# Patient Record
Sex: Male | Born: 1950 | Race: Black or African American | Hispanic: No | Marital: Single | Smoking: Former smoker
Health system: Southern US, Community
[De-identification: ages and names within clinical notes are randomized; demographics above are authoritative.]

## PROBLEM LIST (undated history)

## (undated) DIAGNOSIS — E119 Type 2 diabetes mellitus without complications: Secondary | ICD-10-CM

## (undated) DIAGNOSIS — G4730 Sleep apnea, unspecified: Secondary | ICD-10-CM

## (undated) DIAGNOSIS — E7800 Pure hypercholesterolemia, unspecified: Secondary | ICD-10-CM

## (undated) HISTORY — PX: NO PAST SURGERIES: SHX2092

## (undated) HISTORY — DX: Sleep apnea, unspecified: G47.30

---

## 1998-02-02 ENCOUNTER — Emergency Department (HOSPITAL_COMMUNITY): Admission: EM | Admit: 1998-02-02 | Discharge: 1998-02-02 | Payer: Self-pay | Admitting: Emergency Medicine

## 1998-08-28 ENCOUNTER — Emergency Department (HOSPITAL_COMMUNITY): Admission: EM | Admit: 1998-08-28 | Discharge: 1998-08-28 | Payer: Self-pay | Admitting: Emergency Medicine

## 1998-08-28 ENCOUNTER — Encounter: Payer: Self-pay | Admitting: Emergency Medicine

## 1999-01-09 ENCOUNTER — Emergency Department (HOSPITAL_COMMUNITY): Admission: EM | Admit: 1999-01-09 | Discharge: 1999-01-09 | Payer: Self-pay | Admitting: Endocrinology

## 1999-01-09 ENCOUNTER — Encounter: Payer: Self-pay | Admitting: Endocrinology

## 1999-03-12 ENCOUNTER — Emergency Department (HOSPITAL_COMMUNITY): Admission: EM | Admit: 1999-03-12 | Discharge: 1999-03-12 | Payer: Self-pay | Admitting: *Deleted

## 1999-08-06 ENCOUNTER — Emergency Department (HOSPITAL_COMMUNITY): Admission: EM | Admit: 1999-08-06 | Discharge: 1999-08-06 | Payer: Self-pay | Admitting: *Deleted

## 1999-11-09 ENCOUNTER — Emergency Department (HOSPITAL_COMMUNITY): Admission: EM | Admit: 1999-11-09 | Discharge: 1999-11-09 | Payer: Self-pay | Admitting: *Deleted

## 2000-08-12 ENCOUNTER — Emergency Department (HOSPITAL_COMMUNITY): Admission: EM | Admit: 2000-08-12 | Discharge: 2000-08-12 | Payer: Self-pay | Admitting: Emergency Medicine

## 2000-08-30 ENCOUNTER — Emergency Department (HOSPITAL_COMMUNITY): Admission: EM | Admit: 2000-08-30 | Discharge: 2000-08-30 | Payer: Self-pay | Admitting: *Deleted

## 2000-09-01 ENCOUNTER — Emergency Department (HOSPITAL_COMMUNITY): Admission: EM | Admit: 2000-09-01 | Discharge: 2000-09-01 | Payer: Self-pay | Admitting: Emergency Medicine

## 2001-04-07 ENCOUNTER — Emergency Department (HOSPITAL_COMMUNITY): Admission: EM | Admit: 2001-04-07 | Discharge: 2001-04-07 | Payer: Self-pay | Admitting: Emergency Medicine

## 2003-01-30 ENCOUNTER — Emergency Department (HOSPITAL_COMMUNITY): Admission: EM | Admit: 2003-01-30 | Discharge: 2003-01-30 | Payer: Self-pay | Admitting: Emergency Medicine

## 2003-01-30 ENCOUNTER — Encounter: Payer: Self-pay | Admitting: Emergency Medicine

## 2003-07-13 DIAGNOSIS — Z860100 Personal history of colon polyps, unspecified: Secondary | ICD-10-CM | POA: Insufficient documentation

## 2003-10-16 ENCOUNTER — Emergency Department (HOSPITAL_COMMUNITY): Admission: EM | Admit: 2003-10-16 | Discharge: 2003-10-16 | Payer: Self-pay | Admitting: Emergency Medicine

## 2004-03-31 ENCOUNTER — Emergency Department (HOSPITAL_COMMUNITY): Admission: EM | Admit: 2004-03-31 | Discharge: 2004-03-31 | Payer: Self-pay | Admitting: Emergency Medicine

## 2004-08-18 ENCOUNTER — Emergency Department (HOSPITAL_COMMUNITY): Admission: EM | Admit: 2004-08-18 | Discharge: 2004-08-18 | Payer: Self-pay | Admitting: Emergency Medicine

## 2004-08-25 ENCOUNTER — Ambulatory Visit (HOSPITAL_COMMUNITY): Admission: RE | Admit: 2004-08-25 | Discharge: 2004-08-25 | Payer: Self-pay | Admitting: Emergency Medicine

## 2005-05-13 ENCOUNTER — Emergency Department (HOSPITAL_COMMUNITY): Admission: EM | Admit: 2005-05-13 | Discharge: 2005-05-13 | Payer: Self-pay | Admitting: Emergency Medicine

## 2005-11-03 ENCOUNTER — Emergency Department (HOSPITAL_COMMUNITY): Admission: EM | Admit: 2005-11-03 | Discharge: 2005-11-03 | Payer: Self-pay | Admitting: Emergency Medicine

## 2005-12-13 ENCOUNTER — Emergency Department (HOSPITAL_COMMUNITY): Admission: EM | Admit: 2005-12-13 | Discharge: 2005-12-13 | Payer: Self-pay | Admitting: Emergency Medicine

## 2006-01-04 ENCOUNTER — Emergency Department (HOSPITAL_COMMUNITY): Admission: EM | Admit: 2006-01-04 | Discharge: 2006-01-04 | Payer: Self-pay | Admitting: Emergency Medicine

## 2006-06-21 ENCOUNTER — Emergency Department (HOSPITAL_COMMUNITY): Admission: EM | Admit: 2006-06-21 | Discharge: 2006-06-21 | Payer: Self-pay | Admitting: Emergency Medicine

## 2007-05-07 ENCOUNTER — Emergency Department (HOSPITAL_COMMUNITY): Admission: EM | Admit: 2007-05-07 | Discharge: 2007-05-07 | Payer: Self-pay | Admitting: Emergency Medicine

## 2007-08-21 ENCOUNTER — Emergency Department (HOSPITAL_COMMUNITY): Admission: EM | Admit: 2007-08-21 | Discharge: 2007-08-21 | Payer: Self-pay | Admitting: Emergency Medicine

## 2008-08-24 ENCOUNTER — Emergency Department (HOSPITAL_COMMUNITY): Admission: EM | Admit: 2008-08-24 | Discharge: 2008-08-24 | Payer: Self-pay | Admitting: Emergency Medicine

## 2008-10-22 ENCOUNTER — Emergency Department (HOSPITAL_COMMUNITY): Admission: EM | Admit: 2008-10-22 | Discharge: 2008-10-22 | Payer: Self-pay | Admitting: Emergency Medicine

## 2009-02-04 ENCOUNTER — Inpatient Hospital Stay (HOSPITAL_COMMUNITY): Admission: EM | Admit: 2009-02-04 | Discharge: 2009-02-06 | Payer: Self-pay | Admitting: Emergency Medicine

## 2009-09-25 ENCOUNTER — Emergency Department (HOSPITAL_COMMUNITY): Admission: EM | Admit: 2009-09-25 | Discharge: 2009-09-25 | Payer: Self-pay | Admitting: Emergency Medicine

## 2009-10-27 ENCOUNTER — Emergency Department (HOSPITAL_COMMUNITY): Admission: EM | Admit: 2009-10-27 | Discharge: 2009-10-27 | Payer: Self-pay | Admitting: Emergency Medicine

## 2010-09-29 LAB — COMPREHENSIVE METABOLIC PANEL
AST: 23 U/L (ref 0–37)
Albumin: 3.7 g/dL (ref 3.5–5.2)
BUN: 16 mg/dL (ref 6–23)
Calcium: 8.4 mg/dL (ref 8.4–10.5)
GFR calc Af Amer: >60 mL/min (ref >60)
Potassium: 4 mEq/L (ref 3.5–5.1)
Sodium: 138 mEq/L (ref 135–145)
Total Bilirubin: 0.6 mg/dL (ref 0.3–1.2)

## 2010-09-29 LAB — GLUCOSE, CAPILLARY: Glucose-Capillary: 108 mg/dL — ABNORMAL HIGH (ref 70–99)

## 2010-09-29 LAB — POCT I-STAT, CHEM 8
BUN: 17 mg/dL (ref 6–23)
Chloride: 107 mEq/L (ref 96–112)
Creatinine, Ser: 0.8 mg/dL (ref 0.4–1.5)
Glucose, Bld: 103 mg/dL — ABNORMAL HIGH (ref 70–99)
Hemoglobin: 15.6 g/dL (ref 13.0–17.0)
Potassium: 4.1 mEq/L (ref 3.5–5.1)

## 2010-09-29 LAB — CBC
Hemoglobin: 13.9 g/dL (ref 13.0–17.0)
MCHC: 32.4 g/dL (ref 30.0–36.0)
MCV: 81.1 fL (ref 78.0–100.0)
Platelets: 217 10*3/uL (ref 150–400)
RDW: 17.1 % — ABNORMAL HIGH (ref 11.5–15.5)
WBC: 5.7 10*3/uL (ref 4.0–10.5)

## 2010-09-29 LAB — CK TOTAL AND CKMB (NOT AT ARMC)
Relative Index: INVALID (ref 0.0–2.5)
Total CK: 86 U/L (ref 7–232)

## 2010-10-05 LAB — BASIC METABOLIC PANEL
BUN: 12 mg/dL (ref 6–23)
CO2: 28 mEq/L (ref 19–32)
Chloride: 104 mEq/L (ref 96–112)
Creatinine, Ser: 0.83 mg/dL (ref 0.4–1.5)
Glucose, Bld: 107 mg/dL — ABNORMAL HIGH (ref 70–99)
Sodium: 138 mEq/L (ref 135–145)

## 2010-10-05 LAB — CBC
HCT: 43.7 % (ref 39.0–52.0)
MCHC: 32.2 g/dL (ref 30.0–36.0)
Platelets: 238 10*3/uL (ref 150–400)
RBC: 5.46 MIL/uL (ref 4.22–5.81)
RDW: 18 % — ABNORMAL HIGH (ref 11.5–15.5)
WBC: 5.9 10*3/uL (ref 4.0–10.5)

## 2010-10-18 LAB — URINALYSIS, ROUTINE W REFLEX MICROSCOPIC: Hgb urine dipstick: NEGATIVE

## 2010-10-18 LAB — COMPREHENSIVE METABOLIC PANEL
AST: 23 U/L (ref 0–37)
Alkaline Phosphatase: 66 U/L (ref 39–117)
Calcium: 9.4 mg/dL (ref 8.4–10.5)
Chloride: 101 mEq/L (ref 96–112)
Creatinine, Ser: 1.02 mg/dL (ref 0.4–1.5)
GFR calc Af Amer: >60 mL/min (ref >60)
Total Bilirubin: 0.8 mg/dL (ref 0.3–1.2)
Total Protein: 7 g/dL (ref 6.0–8.3)

## 2010-10-18 LAB — GLUCOSE, CAPILLARY
Glucose-Capillary: 103 mg/dL — ABNORMAL HIGH (ref 70–99)
Glucose-Capillary: 153 mg/dL — ABNORMAL HIGH (ref 70–99)
Glucose-Capillary: 169 mg/dL — ABNORMAL HIGH (ref 70–99)
Glucose-Capillary: 203 mg/dL — ABNORMAL HIGH (ref 70–99)
Glucose-Capillary: 225 mg/dL — ABNORMAL HIGH (ref 70–99)
Glucose-Capillary: 280 mg/dL — ABNORMAL HIGH (ref 70–99)
Glucose-Capillary: 297 mg/dL — ABNORMAL HIGH (ref 70–99)
Glucose-Capillary: 344 mg/dL — ABNORMAL HIGH (ref 70–99)
Glucose-Capillary: 355 mg/dL — ABNORMAL HIGH (ref 70–99)
Glucose-Capillary: 446 mg/dL — ABNORMAL HIGH (ref 70–99)

## 2010-10-18 LAB — HEMOGLOBIN A1C: Mean Plasma Glucose: 272 mg/dL

## 2010-10-18 LAB — LIPID PANEL
Cholesterol: 147 mg/dL (ref 0–200)
LDL Cholesterol: 94 mg/dL (ref 0–99)
Triglycerides: 145 mg/dL (ref <150)
VLDL: 29 mg/dL (ref 0–40)

## 2010-10-18 LAB — BLOOD GAS, VENOUS
Acid-Base Excess: 0.1 mmol/L (ref 0.0–2.0)
Bicarbonate: 25.4 mEq/L — ABNORMAL HIGH (ref 20.0–24.0)
Patient temperature: 98.6
TCO2: 22.5 mmol/L (ref 0–100)
pH, Ven: 7.361 — ABNORMAL HIGH (ref 7.250–7.300)
pO2, Ven: 47 mmHg — ABNORMAL HIGH (ref 30.0–45.0)

## 2010-10-18 LAB — MICROALBUMIN, URINE: Microalb, Ur: 1.73 mg/dL (ref 0.00–1.89)

## 2010-10-18 LAB — URINE MICROSCOPIC-ADD ON

## 2010-10-21 LAB — CBC
HCT: 44.9 % (ref 39.0–52.0)
Hemoglobin: 14.9 g/dL (ref 13.0–17.0)
MCHC: 33.3 g/dL (ref 30.0–36.0)
MCV: 79.8 fL (ref 78.0–100.0)
Platelets: 241 10*3/uL (ref 150–400)
RDW: 16.1 % — ABNORMAL HIGH (ref 11.5–15.5)

## 2010-10-21 LAB — BASIC METABOLIC PANEL
BUN: 12 mg/dL (ref 6–23)
CO2: 26 mEq/L (ref 19–32)
Chloride: 109 mEq/L (ref 96–112)
GFR calc non Af Amer: >60 mL/min (ref >60)
Glucose, Bld: 152 mg/dL — ABNORMAL HIGH (ref 70–99)
Potassium: 4.4 mEq/L (ref 3.5–5.1)
Sodium: 138 mEq/L (ref 135–145)

## 2010-10-21 LAB — TROPONIN I: Troponin I: 0.02 ng/mL (ref 0.00–0.06)

## 2010-10-21 LAB — DIFFERENTIAL
Basophils Absolute: 0.1 10*3/uL (ref 0.0–0.1)
Eosinophils Absolute: 0.5 10*3/uL (ref 0.0–0.7)
Eosinophils Relative: 9 % — ABNORMAL HIGH (ref 0–5)
Lymphocytes Relative: 26 % (ref 12–46)
Monocytes Absolute: 0.4 10*3/uL (ref 0.1–1.0)

## 2010-10-27 LAB — POCT CARDIAC MARKERS
CKMB, poc: 1.1 ng/mL (ref 1.0–8.0)
Troponin i, poc: <0.05 ng/mL (ref 0.00–0.09)

## 2010-11-24 NOTE — Discharge Summary (Signed)
Caleb [name], Caleb [name]           ACCOUNT NO.:  [id number]   MEDICAL RECORD NO.:  [id number]          PATIENT TYPE:  INP   LOCATION:  1509                         FACILITY:  [facility]   PHYSICIAN:  Corinna L. Lendell Caprice, MDDATE OF BIRTH:  16-Nov-1950   DATE OF ADMISSION:  02/04/2009  DATE OF DISCHARGE:  02/06/2009                               DISCHARGE SUMMARY   DISCHARGE DIAGNOSES:  1. New onset type 2 diabetes.  2. Hyperlipidemia.   DISCHARGE MEDICATIONS:  1. Metformin 500 mg p.o. b.i.d., increase as tolerated to 1000 mg p.o.      b.i.d.  2. Amaryl 2 mg daily.   CONDITION:  Stable.   ACTIVITY:  He may return to work in a week.   FOLLOW UP:  Follow up with Dr. Clovis Riley in 7-10 days with blood glucose  checks.  He is also being set up for outpatient diabetic education.   CONSULTATIONS:  None.   PROCEDURE:  None.   DIET:  Should be diabetic, low cholesterol.   LABORATORY DATA:  ABG on room air on admission showed a pH of 7.36, pCO2  was 46.  It was a venous draw.  Complete metabolic panel significant for  a glucose of 422, otherwise unremarkable.  Hemoglobin A1c was 11.7, LDL  was 94, HDL 24.  Urinalysis showed greater than 1000 glucose, 15  ketones, negative nitrite, negative leukocyte esterase.   DIAGNOSTICS:  1. EKG showed normal sinus rhythm with early repolarization.  2. CT brain was negative.   HISTORY/HOSPITAL COURSE:  [name] is a pleasant 60 year old black  male who presented to the emergency room with blurry vision.  He had  been told after having had a fasting blood glucose of 154 several months  ago, to increase his activity level and maintain a modified carbohydrate  diet.  He also had polyuria and polydipsia.  He had normal vital signs.  He was in no acute distress, but was noted to have an elevated blood  glucose.  He was admitted, given IV fluids, insulin and diabetic  teaching.  He was transitioned over to oral medications and this will  need to be  adjusted by his primary care physician.  Currently his  polyuria, polydipsia,  blurry vision have all resolved.  His blood sugars are ranging about 100-  200.  He has received diabetic teaching.  We have continued his statin.   Total time on the day of discharge is 40 minutes.      Corinna L. Lendell Caprice, MD  Electronically Signed     CLS/MEDQ  D:  02/06/2009  T:  02/06/2009  Job:  [id number]   cc:   L. Lupe Carney, M.D.  Fax: [phone]

## 2010-11-24 NOTE — H&P (Signed)
[name], [name]           ACCOUNT NO.:  [id number]   MEDICAL RECORD NO.:  [id number]          PATIENT TYPE:  INP   LOCATION:  0105                         FACILITY:  [facility]   PHYSICIAN:  Kela Millin, M.D.DATE OF BIRTH:  03/30/1951   DATE OF ADMISSION:  02/04/2009  DATE OF DISCHARGE:                              HISTORY & PHYSICAL   CHIEF COMPLAINT:  Blurred vision.   HISTORY OF PRESENT ILLNESS:  Caleb [name] is a 60 year old African  American male admitted today with hyperglycemia.  He notes a several  week history of blurred vision which became worse last several days.  Office notes indicate that he had mild hyperglycemia in June 2010 with a  glucose of 154.  He was counseled on diet and exercise with plans for  follow-up in the office.  He also notes associated polyuria and  polydipsia, but initially denies family history of diabetes type 2.  Upon further questioning, he thinks his sister may be diabetic.  We are  asked to admit the patient for glycemic control and diabetes teaching   ALLERGIES:  Simvastatin causes fatigue and ache.   PAST MEDICAL HISTORY.:  1. Hypercholesterolemia.  2. Sacral ileitis  3. Mild obesity.   PAST SURGICAL HISTORY:  None.   SOCIAL HISTORY:  No tobacco.  No alcohol.  No drugs.  He is single and  works as a Arboriculturist.   FAMILY HISTORY:  Mother had cancer, dad had cancer.  Brother had colon  cancer.   REVIEW OF SYSTEMS:  GENERAL:  No fever. No chills.  ENT:  No sore  throat.  No nasal congestion.  RESPIRATORY:  No cough.  No shortness of  breath.  NEURO:  No headache, no dizziness, positive visual changes  (blurring).  Negative numbness or tingling.  GI:  No nausea, no  indigestion.  Denies constipation or diarrhea.  GU:  Does note polyuria.  Musculoskeletal:  Denies joint pain or muscle weakness.  PSYCHIATRIC:  Denies Anxiety or depression.  SKIN:  No rashes.  No lesions.  HEMATOLOGY:  No bleeding.  No bruising.   LABORATORY/RADIOLOGY DATA:  Venous ABG, pH 7.36, pCO2 46, pO2 47, sodium  134, potassium 4.4, chloride 101, bicarb 25, BUN 19, creatinine 1.02,  anion gap 8.  Urinalysis positive glucose, negative nitrate, negative  leukocyte.   PHYSICAL EXAMINATION:  VITAL SIGNS:  BP 123/66, heart rate 76,  respirations 18, temperature 97.6, O2 sat 96% on room air.  GENERAL:  Awake, alert African American male in no acute distress.  NECK:  Supple.  No masses.  CARDIOVASCULAR:.  S1, S2 regular rate and rhythm.  RESPIRATORY:  Breath sounds and auscultation bilaterally without  wheezes, rales, rhonchi.  No increased work of breathing.  ABDOMEN:  Soft, nontender, nondistended.  Positive bowel sounds.  PSYCHIATRIC:  AO x3, pleasant.  NEUROLOGICAL:  Moving all extremities.  Speech is clear.   ASSESSMENT/PLAN:  1. Blurred vision secondary to hyperglycemia in setting of new onset      diabetes type 2.  We will check hemoglobin A1c.  We will also check      a C-peptide and TSH.  Plan to add Lantus insulin and sliding scale      coverage.  We will request nutrition consult as well as diabetes      teaching and insulin teaching.  Plan to also check urine      microalbumin.  2. Hypercholesterolemia.  Continue statin.  Check fasting lipid      profile.  3. Obesity.  The patient was counseled weight loss by an exercise, and      we will request nutrition evaluation.      Sandford Craze, NP      Kela Millin, M.D.  Electronically Signed    MO/MEDQ  D:  02/04/2009  T:  02/04/2009  Job:  [id number]   cc:   L. Lupe Carney, M.D.  Fax: [phone]

## 2011-02-09 ENCOUNTER — Emergency Department (HOSPITAL_COMMUNITY)
Admission: EM | Admit: 2011-02-09 | Discharge: 2011-02-09 | Disposition: A | Discharge status: Home / Self Care | Payer: BC Managed Care – PPO | Attending: Emergency Medicine | Admitting: Emergency Medicine

## 2011-02-09 ENCOUNTER — Emergency Department (HOSPITAL_COMMUNITY): Payer: BC Managed Care – PPO

## 2011-02-09 DIAGNOSIS — N419 Inflammatory disease of prostate, unspecified: Secondary | ICD-10-CM | POA: Insufficient documentation

## 2011-02-09 DIAGNOSIS — R079 Chest pain, unspecified: Secondary | ICD-10-CM | POA: Insufficient documentation

## 2011-02-09 DIAGNOSIS — R300 Dysuria: Secondary | ICD-10-CM | POA: Insufficient documentation

## 2011-02-09 DIAGNOSIS — E785 Hyperlipidemia, unspecified: Secondary | ICD-10-CM | POA: Insufficient documentation

## 2011-02-09 LAB — DIFFERENTIAL
Basophils Relative: 0 % (ref 0–1)
Eosinophils Absolute: 0 10*3/uL (ref 0.0–0.7)
Eosinophils Relative: 0 % (ref 0–5)
Lymphs Abs: 1.1 10*3/uL (ref 0.7–4.0)
Monocytes Relative: 7 % (ref 3–12)

## 2011-02-09 LAB — URINE MICROSCOPIC-ADD ON

## 2011-02-09 LAB — BASIC METABOLIC PANEL
BUN: 14 mg/dL (ref 6–23)
CO2: 30 mEq/L (ref 19–32)
Calcium: 9.7 mg/dL (ref 8.4–10.5)
Creatinine, Ser: 0.85 mg/dL (ref 0.50–1.35)
Glucose, Bld: 96 mg/dL (ref 70–99)
Sodium: 134 mEq/L — ABNORMAL LOW (ref 135–145)

## 2011-02-09 LAB — URINALYSIS, ROUTINE W REFLEX MICROSCOPIC
Hgb urine dipstick: NEGATIVE
Nitrite: NEGATIVE
Specific Gravity, Urine: 1.02 (ref 1.005–1.030)
Urobilinogen, UA: 0.2 mg/dL (ref 0.0–1.0)
pH: 5.5 (ref 5.0–8.0)

## 2011-02-09 LAB — CBC
MCH: 26 pg (ref 26.0–34.0)
MCHC: 34.2 g/dL (ref 30.0–36.0)
MCV: 76.1 fL — ABNORMAL LOW (ref 78.0–100.0)
Platelets: 205 10*3/uL (ref 150–400)
RDW: 15.6 % — ABNORMAL HIGH (ref 11.5–15.5)

## 2011-03-19 ENCOUNTER — Emergency Department (HOSPITAL_COMMUNITY)
Admission: EM | Admit: 2011-03-19 | Discharge: 2011-03-19 | Disposition: A | Discharge status: Home / Self Care | Payer: BC Managed Care – PPO | Attending: Emergency Medicine | Admitting: Emergency Medicine

## 2011-03-19 DIAGNOSIS — E119 Type 2 diabetes mellitus without complications: Secondary | ICD-10-CM | POA: Insufficient documentation

## 2011-03-19 DIAGNOSIS — K6289 Other specified diseases of anus and rectum: Secondary | ICD-10-CM | POA: Insufficient documentation

## 2011-03-19 LAB — URINALYSIS, ROUTINE W REFLEX MICROSCOPIC
Bilirubin Urine: NEGATIVE
Glucose, UA: NEGATIVE mg/dL
Hgb urine dipstick: NEGATIVE
Protein, ur: NEGATIVE mg/dL

## 2011-03-19 LAB — OCCULT BLOOD, POC DEVICE: Fecal Occult Bld: POSITIVE

## 2011-03-19 LAB — POCT I-STAT, CHEM 8
BUN: 14 mg/dL (ref 6–23)
Calcium, Ion: 1.24 mmol/L (ref 1.12–1.32)
Chloride: 103 mEq/L (ref 96–112)
Creatinine, Ser: 1.1 mg/dL (ref 0.50–1.35)

## 2011-03-20 LAB — URINE CULTURE
Colony Count: NO GROWTH
Culture: NO GROWTH

## 2011-06-21 ENCOUNTER — Encounter: Payer: Self-pay | Admitting: Emergency Medicine

## 2011-06-21 ENCOUNTER — Emergency Department (HOSPITAL_COMMUNITY)
Admission: EM | Admit: 2011-06-21 | Discharge: 2011-06-21 | Disposition: A | Discharge status: Home / Self Care | Payer: BC Managed Care – PPO | Attending: Emergency Medicine | Admitting: Emergency Medicine

## 2011-06-21 DIAGNOSIS — K644 Residual hemorrhoidal skin tags: Secondary | ICD-10-CM | POA: Insufficient documentation

## 2011-06-21 DIAGNOSIS — K625 Hemorrhage of anus and rectum: Secondary | ICD-10-CM | POA: Insufficient documentation

## 2011-06-21 LAB — CBC
MCH: 25.4 pg — ABNORMAL LOW (ref 26.0–34.0)
MCHC: 33.8 g/dL (ref 30.0–36.0)
Platelets: 236 10*3/uL (ref 150–400)
RBC: 5.31 MIL/uL (ref 4.22–5.81)

## 2011-06-21 LAB — OCCULT BLOOD, POC DEVICE: Fecal Occult Bld: NEGATIVE

## 2011-06-21 MED ORDER — STARCH 51 % RE SUPP: 1.0000 | RECTAL | Status: AC | PRN

## 2011-06-21 NOTE — ED Notes (Signed)
Pt states seen some bright red blood in stool this am.

## 2011-06-21 NOTE — ED Provider Notes (Cosign Needed)
History     CSN: [id number] Arrival date & time: 06/21/2011  7:57 AM   First MD Initiated Contact with Patient 06/21/11 [phone]      Chief Complaint  Patient presents with  . Rectal Bleeding   patient reports blood on the tissue paper. This morning when wiping. He's also had some rectal irritation. Patient did have a normal physical examination done in August and states he had a colonoscopy done earlier this year by Dr. Dulce Sellar. He reports one polyp, which she states was benign. He has continued rectal irritation, but is unsure, whether he's had a hemorrhoid. He denies any abdominal pain. Denies any dizziness or weakness.  (Consider location/radiation/quality/duration/timing/severity/associated sxs/prior treatment) HPI  Past Medical History  Diagnosis Date  . Diabetes mellitus     History reviewed. No pertinent past surgical history.  History reviewed. No pertinent family history.  History  Substance Use Topics  . Smoking status: Not on file  . Smokeless tobacco: Not on file  . Alcohol Use: No      Review of Systems  All other systems reviewed and are negative.    Allergies  Review of patient's allergies indicates no known allergies.  Home Medications  No current outpatient prescriptions on file.  BP 145/89  Pulse 72  Temp(Src) 97.8 F (36.6 C) (Oral)  Resp 16  SpO2 100%  Physical Exam  Nursing note and vitals reviewed. Constitutional: He is oriented to person, place, and time. He appears well-developed and well-nourished.  HENT:  Head: Normocephalic and atraumatic.  Eyes: Conjunctivae and EOM are normal. Pupils are equal, round, and reactive to light.  Neck: Neck supple.  Cardiovascular: Normal rate and regular rhythm.  Exam reveals no gallop and no friction rub.   No murmur heard. Pulmonary/Chest: Breath sounds normal. He has no wheezes. He has no rales. He exhibits no tenderness.  Abdominal: Soft. Bowel sounds are normal. He exhibits no distension. There  is no tenderness. There is no rebound and no guarding.  Genitourinary: Rectum normal. Guaiac negative stool.       Rectal exam shows very small external hemorrhoid at the 12:00 position. Possibly a small fissure. No mass. Stool. Color, is brown, guaiac-negative. No other abnormal findings noted  Musculoskeletal: Normal range of motion.  Neurological: He is alert and oriented to person, place, and time. No cranial nerve deficit. Coordination normal.  Skin: Skin is warm and dry. No rash noted.  Psychiatric: He has a normal mood and affect.    ED Course  Procedures (including critical care time)  Labs Reviewed - No data to display No results found.   No diagnosis found.    MDM  Pt is seen and examined;  Initial history and physical completed.  Will follow.        Previous chart and records have been reviewed thoroughly.  Was seen on September 7 for anal pain and given hydrocortisone cream and referred to GI.  Parminder Trapani A. Patrica Duel, MD 06/21/11 [phone]  Results for orders placed during the hospital encounter of 06/21/11  OCCULT BLOOD, POC DEVICE      Component Value Range   Fecal Occult Bld NEGATIVE     No results found.    Emmy Keng A. Patrica Duel, MD 06/22/11 [phone]

## 2012-05-22 ENCOUNTER — Emergency Department (HOSPITAL_COMMUNITY)
Admission: EM | Admit: 2012-05-22 | Discharge: 2012-05-22 | Disposition: A | Discharge status: Home / Self Care | Payer: BC Managed Care – PPO | Attending: Emergency Medicine | Admitting: Emergency Medicine

## 2012-05-22 ENCOUNTER — Encounter (HOSPITAL_COMMUNITY): Payer: Self-pay | Admitting: Emergency Medicine

## 2012-05-22 DIAGNOSIS — E119 Type 2 diabetes mellitus without complications: Secondary | ICD-10-CM | POA: Insufficient documentation

## 2012-05-22 DIAGNOSIS — M549 Dorsalgia, unspecified: Secondary | ICD-10-CM

## 2012-05-22 DIAGNOSIS — Y9389 Activity, other specified: Secondary | ICD-10-CM | POA: Insufficient documentation

## 2012-05-22 DIAGNOSIS — Z79899 Other long term (current) drug therapy: Secondary | ICD-10-CM | POA: Insufficient documentation

## 2012-05-22 DIAGNOSIS — IMO0002 Reserved for concepts with insufficient information to code with codable children: Secondary | ICD-10-CM | POA: Insufficient documentation

## 2012-05-22 DIAGNOSIS — Y92410 Unspecified street and highway as the place of occurrence of the external cause: Secondary | ICD-10-CM | POA: Insufficient documentation

## 2012-05-22 MED ORDER — DIAZEPAM 2 MG PO TABS: 2.0000 mg | ORAL_TABLET | Freq: Four times a day (QID) | ORAL | Status: DC | PRN

## 2012-05-22 MED ORDER — IBUPROFEN 600 MG PO TABS: 600.0000 mg | ORAL_TABLET | Freq: Four times a day (QID) | ORAL | Status: DC | PRN

## 2012-05-22 NOTE — ED Provider Notes (Signed)
Medical screening examination/treatment/procedure(s) were performed by non-physician practitioner and as supervising physician I was immediately available for consultation/collaboration.  Cheri Guppy, MD 05/22/12 [phone]
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
## 2012-05-24 ENCOUNTER — Emergency Department (HOSPITAL_COMMUNITY)
Admission: EM | Admit: 2012-05-24 | Discharge: 2012-05-24 | Disposition: A | Discharge status: Home / Self Care | Payer: BC Managed Care – PPO | Attending: Emergency Medicine | Admitting: Emergency Medicine

## 2012-05-24 ENCOUNTER — Encounter (HOSPITAL_COMMUNITY): Payer: Self-pay | Admitting: Emergency Medicine

## 2012-05-24 DIAGNOSIS — M549 Dorsalgia, unspecified: Secondary | ICD-10-CM | POA: Insufficient documentation

## 2012-05-24 DIAGNOSIS — M542 Cervicalgia: Secondary | ICD-10-CM | POA: Insufficient documentation

## 2012-05-24 DIAGNOSIS — E119 Type 2 diabetes mellitus without complications: Secondary | ICD-10-CM | POA: Insufficient documentation

## 2012-05-24 DIAGNOSIS — Z79899 Other long term (current) drug therapy: Secondary | ICD-10-CM | POA: Insufficient documentation
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
## 2013-05-04 ENCOUNTER — Encounter (HOSPITAL_COMMUNITY)
Admission: EM | Disposition: A | Discharge status: Home / Self Care | Payer: Self-pay | Source: Home / Self Care | Attending: Cardiology

## 2013-05-04 ENCOUNTER — Inpatient Hospital Stay (HOSPITAL_COMMUNITY)
Admission: EM | Admit: 2013-05-04 | Discharge: 2013-05-05 | DRG: 392 | Disposition: A | Discharge status: Home / Self Care | Payer: BC Managed Care – PPO | Attending: Cardiology | Admitting: Cardiology

## 2013-05-04 ENCOUNTER — Encounter (HOSPITAL_COMMUNITY): Payer: Self-pay | Admitting: Emergency Medicine

## 2013-05-04 DIAGNOSIS — E119 Type 2 diabetes mellitus without complications: Secondary | ICD-10-CM | POA: Diagnosis present

## 2013-05-04 DIAGNOSIS — Z79899 Other long term (current) drug therapy: Secondary | ICD-10-CM

## 2013-05-04 DIAGNOSIS — E7800 Pure hypercholesterolemia, unspecified: Secondary | ICD-10-CM | POA: Diagnosis present

## 2013-05-04 DIAGNOSIS — E785 Hyperlipidemia, unspecified: Secondary | ICD-10-CM | POA: Diagnosis present

## 2013-05-04 DIAGNOSIS — Z7982 Long term (current) use of aspirin: Secondary | ICD-10-CM

## 2013-05-04 DIAGNOSIS — K219 Gastro-esophageal reflux disease without esophagitis: Principal | ICD-10-CM | POA: Diagnosis present

## 2013-05-04 DIAGNOSIS — Z87891 Personal history of nicotine dependence: Secondary | ICD-10-CM

## 2013-05-04 DIAGNOSIS — I200 Unstable angina: Secondary | ICD-10-CM

## 2013-05-04 HISTORY — DX: Type 2 diabetes mellitus without complications: E11.9

## 2013-05-04 HISTORY — PX: LEFT HEART CATHETERIZATION WITH CORONARY ANGIOGRAM: SHX5451

## 2013-05-04 HISTORY — DX: Pure hypercholesterolemia, unspecified: E78.00

## 2013-05-04 LAB — BASIC METABOLIC PANEL
GFR calc Af Amer: >90 mL/min (ref >90)
GFR calc non Af Amer: >90 mL/min (ref >90)
Potassium: 4 mEq/L (ref 3.5–5.1)
Sodium: 134 mEq/L — ABNORMAL LOW (ref 135–145)

## 2013-05-04 LAB — PROTIME-INR: INR: 1 (ref 0.00–1.49)

## 2013-05-04 LAB — CBC
MCHC: 34.1 g/dL (ref 30.0–36.0)
RDW: 15.3 % (ref 11.5–15.5)

## 2013-05-04 LAB — GLUCOSE, CAPILLARY: Glucose-Capillary: 99 mg/dL (ref 70–99)

## 2013-05-04 LAB — TROPONIN I: Troponin I: <0.3 ng/mL (ref <0.30)

## 2013-05-04 LAB — APTT: aPTT: 30 seconds (ref 24–37)

## 2013-05-04 SURGERY — LEFT HEART CATHETERIZATION WITH CORONARY ANGIOGRAM
Anesthesia: LOCAL

## 2013-05-04 MED ORDER — ASPIRIN 300 MG RE SUPP
300.0000 mg | RECTAL | Status: DC
  Filled 2013-05-04: qty 1

## 2013-05-04 MED ORDER — SODIUM CHLORIDE 0.9 % IJ SOLN: 3.0000 mL | Freq: Two times a day (BID) | INTRAMUSCULAR | Status: DC

## 2013-05-04 MED ORDER — GI COCKTAIL ~~LOC~~: 30.0000 mL | Freq: Once | ORAL | Status: DC

## 2013-05-04 MED ORDER — HYDROMORPHONE HCL PF 2 MG/ML IJ SOLN
INTRAMUSCULAR | Status: AC
  Filled 2013-05-04: qty 1

## 2013-05-04 MED ORDER — ASPIRIN EC 81 MG PO TBEC: 81.0000 mg | DELAYED_RELEASE_TABLET | Freq: Every day | ORAL | Status: DC

## 2013-05-04 MED ORDER — SODIUM CHLORIDE 0.9 % IV SOLN: 250.0000 mL | INTRAVENOUS | Status: DC | PRN

## 2013-05-04 MED ORDER — ASPIRIN 81 MG PO CHEW: 324.0000 mg | CHEWABLE_TABLET | ORAL | Status: DC

## 2013-05-04 MED ORDER — NITROGLYCERIN 0.4 MG SL SUBL
0.4000 mg | SUBLINGUAL_TABLET | SUBLINGUAL | Status: DC | PRN
  Administered 2013-05-04 (×3): 0.4 mg via SUBLINGUAL
  Filled 2013-05-04: qty 25

## 2013-05-04 MED ORDER — ACETAMINOPHEN 325 MG PO TABS: 650.0000 mg | ORAL_TABLET | ORAL | Status: DC | PRN

## 2013-05-04 MED ORDER — MORPHINE SULFATE 4 MG/ML IJ SOLN
4.0000 mg | INTRAMUSCULAR | Status: DC | PRN
  Administered 2013-05-04: 4 mg via INTRAVENOUS
  Filled 2013-05-04: qty 1

## 2013-05-04 MED ORDER — SODIUM CHLORIDE 0.9 % IJ SOLN: 3.0000 mL | INTRAMUSCULAR | Status: DC | PRN

## 2013-05-04 MED ORDER — SODIUM CHLORIDE 0.9 % IV SOLN
INTRAVENOUS | Status: DC
  Administered 2013-05-04: 16:00:00 via INTRAVENOUS

## 2013-05-04 MED ORDER — LIDOCAINE HCL (PF) 1 % IJ SOLN
INTRAMUSCULAR | Status: AC
  Filled 2013-05-04: qty 30

## 2013-05-04 MED ORDER — ATORVASTATIN CALCIUM 80 MG PO TABS
80.0000 mg | ORAL_TABLET | Freq: Every day | ORAL | Status: DC
  Filled 2013-05-04: qty 1

## 2013-05-04 MED ORDER — NITROGLYCERIN IN D5W 200-5 MCG/ML-% IV SOLN: 2.0000 ug/min | INTRAVENOUS | Status: DC

## 2013-05-04 MED ORDER — SIMVASTATIN 20 MG PO TABS: 20.0000 mg | ORAL_TABLET | Freq: Every day | ORAL | Status: DC

## 2013-05-04 MED ORDER — MIDAZOLAM HCL 2 MG/2ML IJ SOLN
INTRAMUSCULAR | Status: AC
  Filled 2013-05-04: qty 2

## 2013-05-04 MED ORDER — ONDANSETRON HCL 4 MG/2ML IJ SOLN: 4.0000 mg | Freq: Four times a day (QID) | INTRAMUSCULAR | Status: DC | PRN

## 2013-05-04 MED ORDER — ONDANSETRON HCL 4 MG/2ML IJ SOLN
4.0000 mg | Freq: Four times a day (QID) | INTRAMUSCULAR | Status: DC | PRN
  Administered 2013-05-04: 4 mg via INTRAVENOUS
  Filled 2013-05-04: qty 2

## 2013-05-04 MED ORDER — HEPARIN SODIUM (PORCINE) 5000 UNIT/ML IJ SOLN: 60.0000 [IU]/kg | Freq: Once | INTRAMUSCULAR | Status: DC

## 2013-05-04 MED ORDER — ONDANSETRON HCL 4 MG/2ML IJ SOLN
4.0000 mg | Freq: Once | INTRAMUSCULAR | Status: AC
  Administered 2013-05-04: 4 mg via INTRAVENOUS
  Filled 2013-05-04: qty 2

## 2013-05-04 MED ORDER — NITROGLYCERIN 0.2 MG/ML ON CALL CATH LAB
INTRAVENOUS | Status: AC
  Filled 2013-05-04: qty 1

## 2013-05-04 MED ORDER — HEPARIN (PORCINE) IN NACL 100-0.45 UNIT/ML-% IJ SOLN
1000.0000 [IU]/h | INTRAMUSCULAR | Status: DC
  Administered 2013-05-04: 1000 [IU]/h via INTRAVENOUS
  Filled 2013-05-04: qty 250

## 2013-05-04 MED ORDER — SODIUM CHLORIDE 0.9 % IV SOLN
1.0000 mL/kg/h | INTRAVENOUS | Status: AC
  Administered 2013-05-04: 1 mL/kg/h via INTRAVENOUS

## 2013-05-04 MED ORDER — HEPARIN (PORCINE) IN NACL 2-0.9 UNIT/ML-% IJ SOLN
INTRAMUSCULAR | Status: AC
  Filled 2013-05-04: qty 1000

## 2013-05-04 MED ORDER — METOPROLOL TARTRATE 25 MG PO TABS
25.0000 mg | ORAL_TABLET | Freq: Two times a day (BID) | ORAL | Status: DC
  Administered 2013-05-05: 25 mg via ORAL
  Filled 2013-05-04 (×3): qty 1

## 2013-05-04 MED ORDER — ASPIRIN 81 MG PO CHEW
324.0000 mg | CHEWABLE_TABLET | Freq: Once | ORAL | Status: AC
  Administered 2013-05-04: 324 mg via ORAL
  Filled 2013-05-04: qty 4

## 2013-05-04 MED ORDER — HEPARIN BOLUS VIA INFUSION
4000.0000 [IU] | Freq: Once | INTRAVENOUS | Status: AC
  Administered 2013-05-04: 4000 [IU] via INTRAVENOUS
  Filled 2013-05-04: qty 4000

## 2013-05-04 NOTE — CV Procedure (Addendum)
Procedure performed:  Left heart catheterization including hemodynamic monitoring of the left ventricle, LV gram, selective right and left coronary arteriography.  Indication patient is a 62 year-old AA male with history of hyperlipidemia, Diet controlled Diabetes Mellitus   who presents with Chest pain to the emergency room which was nitrate responsive and patient was admitted as unstable angina pectoris. His EKG and his cardiac markers were negative microinjury. However due to patient's preference she was brought to the cardiac catheterization lab to evaluate his coronary anatomy.  Hemodynamic data:  Left ventricular pressure was 135/2 with LVEDP of 4 mm mercury. Aortic pressure was 127/70 with a mean of 88 mm mercury. There was no pressure gradient across the aortic valve  Left ventricle: Performed in the RAO projection revealed LVEF of 65%. There was No MR. No wall motion abnormality.  Right coronary artery: The vessel is smooth, normal, Non-Dominant.  Left main coronary artery is large and normal.  Circumflex coronary artery: Is a dominant vessel  A large vessel giving origin to a large obtuse marginal 1. It is smooth and normal. Distal RCA stress that distal circumflex coronary artery gives origin to 2 large PDA branches, which are tortuous but otherwise normal.  LAD:  LAD gives origin to a large diagonal-1.  Normal.  Impression: Normal coronary arteries with left dominant circulation with normal left systolic function.  Technique: Under sterile precautions using a 6 French right radial  arterial access, a 6 French sheath was introduced into the right radial artery. A 5 [place] Tig 4 catheter was advanced into the ascending aorta selective  right coronary artery and left coronary artery was cannulated and angiography was performed in multiple views. The catheter was pulled back Out of the body over exchange length J-wire.  Same Catheter was used to perform LV gram which was performed in  RAO projection.  Catheter exchanged out of the body over J-Wire. NO immediate complications noted. Patient tolerated the procedure well.   Rec: Medical therapy with aggressive risk factor reduction.   Disposition: Will be discharged home tomorrow  with outpatient follow up. A total of 35 cc of contrast was utilized for diagnostic procedure.

## 2013-05-04 NOTE — ED Notes (Signed)
Pt reports L sided chest pain that radiates down his L arm, pt states that he awoke with the chest pain at 0450.  Pt also states he has a HA and feels weak.

## 2013-05-04 NOTE — H&P (Signed)
Caleb [name] is an 62 y.o. male.   Chief Complaint: Chest pain HPI: Patient is a 62 year old African American male with history of hyperlipidemia and diabetes mellitus, diet controlled, who is presently doing well until this morning when he was getting ready to go to work, suddenly felt chest discomfort in the left upper part of the chest. It is described as moderate, no other associated symptoms of nausea, vomiting or diaphoresis. However due to persistent chest discomfort which lasted for more than 30 minutes, he presented to [facility] long hospital. There he was given 3 some old nitroglycerin, which completely for chest discomfort without any recurrence. Due to his multiple cardiovascular status was admitted for unstable angina, to exclude myocardial infarction.  Patient states that he has not had any recurrence of chest pain. He otherwise remains asymptomatic. His brother and sister are at the bedside.  He denies symptoms of TIA or claudication. Denies any recent weight changes. He has not had any bowel or bladder disturbances, dark stools or bloody stools. No recent surgeries.  Past Medical History  Diagnosis Date  . High cholesterol   . Type II diabetes mellitus     Past Surgical History  Procedure Laterality Date  . No past surgeries      Family History  Problem Relation Age of Onset  . Cancer Mother   . Cancer Father   . Cancer Brother    Social History:  reports that he has quit smoking. His smoking use included Cigarettes. He has a 2.4 pack-year smoking history. He has never used smokeless tobacco. He reports that he does not drink alcohol or use illicit drugs.  Allergies: No Known Allergies  Medications Prior to Admission  Medication Sig Dispense Refill  . aspirin EC 81 MG tablet Take 81 mg by mouth daily.      . pravastatin (PRAVACHOL) 40 MG tablet Take 40 mg by mouth at bedtime.          Review of Systems - Negative except Diet controlled diabetes. Please see H&P  I.  Blood pressure 130/68, pulse 60, temperature 97.4 F (36.3 C), temperature source Oral, resp. rate 16, height 5\' 9"  (1.753 m), weight 85.3 kg (188 lb 0.8 oz), SpO2 99.00%. General appearance: alert, cooperative, appears stated age and no distress Eyes: negative findings: lids and lashes normal and conjunctivae and sclerae normal Neck: no adenopathy, no carotid bruit, no JVD, supple, symmetrical, trachea midline and thyroid not enlarged, symmetric, no tenderness/mass/nodules Neck: JVP - normal, carotids 2+= without bruits Resp: clear to auscultation bilaterally Chest wall: no tenderness Cardio: regular rate and rhythm, S1, S2 normal, no murmur, click, rub or gallop GI: soft, non-tender; bowel sounds normal; no masses,  no organomegaly Extremities: extremities normal, atraumatic, no cyanosis or edema Pulses: 2+ and symmetric Skin: Skin color, texture, turgor normal. No rashes or lesions Neurologic: Grossly normal  Results for orders placed during the hospital encounter of 05/04/13 (from the past 48 hour(s))  GLUCOSE, CAPILLARY     Status: Abnormal   Collection Time    05/04/13  6:59 AM      Result Value Range   Glucose-Capillary 223 (*) 70 - 99 mg/dL   Comment 1 Documented in Chart     Comment 2 Notify RN    CBC     Status: Abnormal   Collection Time    05/04/13  7:05 AM      Result Value Range   WBC 5.1  4.0 - 10.5 K/uL   RBC 5.39  4.22 - 5.81 MIL/uL   Hemoglobin 14.0  13.0 - 17.0 g/dL   HCT 40.9  81.1 - 91.4 %   MCV 76.3 (*) 78.0 - 100.0 fL   MCH 26.0  26.0 - 34.0 pg   MCHC 34.1  30.0 - 36.0 g/dL   RDW 78.2  95.6 - 21.3 %   Platelets 258  150 - 400 K/uL  BASIC METABOLIC PANEL     Status: Abnormal   Collection Time    05/04/13  7:05 AM      Result Value Range   Sodium 134 (*) 135 - 145 mEq/L   Potassium 4.0  3.5 - 5.1 mEq/L   Chloride 100  96 - 112 mEq/L   CO2 24  19 - 32 mEq/L   Glucose, Bld 185 (*) 70 - 99 mg/dL   BUN 13  6 - 23 mg/dL   Creatinine, Ser 0.86  0.50  - 1.35 mg/dL   Calcium 9.4  8.4 - 57.8 mg/dL   GFR calc non Af Amer >90  >90 mL/min   GFR calc Af Amer >90  >90 mL/min   Comment: (NOTE)     The eGFR has been calculated using the CKD EPI equation.     This calculation has not been validated in all clinical situations.     eGFR's persistently <90 mL/min signify possible Chronic Kidney     Disease.  POCT I-STAT TROPONIN I     Status: None   Collection Time    05/04/13  7:15 AM      Result Value Range   Troponin i, poc 0.00  0.00 - 0.08 ng/mL   Comment 3            Comment: Due to the release kinetics of cTnI,     a negative result within the first hours     of the onset of symptoms does not rule out     myocardial infarction with certainty.     If myocardial infarction is still suspected,     repeat the test at appropriate intervals.  APTT     Status: None   Collection Time    05/04/13 10:15 AM      Result Value Range   aPTT 30  24 - 37 seconds  PROTIME-INR     Status: None   Collection Time    05/04/13 10:15 AM      Result Value Range   Prothrombin Time 13.0  11.6 - 15.2 seconds   INR 1.00  0.00 - 1.49  GLUCOSE, CAPILLARY     Status: None   Collection Time    05/04/13  2:16 PM      Result Value Range   Glucose-Capillary 99  70 - 99 mg/dL  GLUCOSE, CAPILLARY     Status: Abnormal   Collection Time    05/04/13  4:05 PM      Result Value Range   Glucose-Capillary 105 (*) 70 - 99 mg/dL   Comment 1 Notify RN    TROPONIN I     Status: None   Collection Time    05/04/13  4:20 PM      Result Value Range   Troponin I <0.30  <0.30 ng/mL   Comment:            Due to the release kinetics of cTnI,     a negative result within the first hours     of the onset of symptoms does not rule out  myocardial infarction with certainty.     If myocardial infarction is still suspected,     repeat the test at appropriate intervals.   No results found.  Labs:   Lab Results  Component Value Date   WBC 5.1 05/04/2013   HGB 14.0  05/04/2013   HCT 41.1 05/04/2013   MCV 76.3* 05/04/2013   PLT 258 05/04/2013    Recent Labs Lab 05/04/13 0705  NA 134*  K 4.0  CL 100  CO2 24  BUN 13  CREATININE 0.87  CALCIUM 9.4  GLUCOSE 185*   Lab Results  Component Value Date   CKTOTAL 133 02/09/2011   CKMB 1.9 02/09/2011   TROPONINI <0.30 05/04/2013    Lipid Panel     Component Value Date/Time   CHOL  Value: 147        ATP III CLASSIFICATION:  <200     mg/dL   Desirable  161-096  mg/dL   Borderline High  >=045    mg/dL   High        10/18/8117 0520   TRIG 145 02/05/2009 0520   HDL 24* 02/05/2009 0520   CHOLHDL 6.1 02/05/2009 0520   VLDL 29 02/05/2009 0520   LDLCALC  Value: 94        Total Cholesterol/HDL:CHD Risk Coronary Heart Disease Risk Table                     Men   Women  1/2 Average Risk   3.4   3.3  Average Risk       5.0   4.4  2 X Average Risk   9.6   7.1  3 X Average Risk  23.4   11.0        Use the calculated Patient Ratio above and the CHD Risk Table to determine the patient's CHD Risk.        ATP III CLASSIFICATION (LDL):  <100     mg/dL   Optimal  147-829  mg/dL   Near or Above                    Optimal  130-159  mg/dL   Borderline  562-130  mg/dL   High  >865     mg/dL   Very High 7/84/6962 9528    EKG: normal EKG, normal sinus rhythm, unchanged from previous tracings (per ED record, but no EKG available to review).   Assessment/Plan 1. Chest pain suggestive of unstable angina, nitrate responsive chest discomfort in a patient with diabetes mellitus and hyperlipidemia and male age greater than 4 years of age.  Recommendation: I discussed with the patient and his family regarding his presentation which is suspicious for unstable angina pectoris. However his physical examination and his EKG are essentially normal. I gave them the option of proceeding with coronary angiography to definitively exclude coronary artery disease versus watchful waiting and ruling him out of acute coronary syndrome and non-ST elevation  myocardial infarction and possibly consider stress testing in the morning. However patient states that he would like to proceed with cholangiography. They understand less than a percent risk of death, stroke, MI, need for urgent CABG but not limited to these as major complication for coronary angiography and possible angioplasty.  Pamella Pert, MD 05/04/2013, 5:33 PM [facility] Pager: [phone] Office: [phone] If no answer: Cell:  [phone]

## 2013-05-04 NOTE — ED Notes (Addendum)
Pt reports a small decline in CP with nitro x2, 7/10, but is reporting a sudden HA. Pt in NAD. Pt states that he doesn't want anything for HA at this time because it's "coming and going."

## 2013-05-04 NOTE — Progress Notes (Signed)
   CARE MANAGEMENT ED NOTE 05/04/2013  Patient:  Caleb [name], Caleb [name]   Account Number:  [id number]  Date Initiated:  05/04/2013  Documentation initiated by:  Edd Arbour  Subjective/Objective Assessment:   62 yr old male [facility] ppo c/oc left side  chest and arm pain EDP noted systolic murmur EKG without changes Troponin normal Pain resolved after ASA po, 3 SL NTG, morphine iv and zofran IV provided in [facility] ED     Subjective/Objective Assessment Detail:   No Pcp was noted in EPIC  pcp Dr Lupe Carney per pt     Action/Plan:   ED CM spoke with pt to updated pcp  in EPIC   Action/Plan Detail:   Anticipated DC Date:       Status Recommendation to Physician:   Result of Recommendation:    Other ED Services  Consult Working Plan    DC Planning Services  Other    Choice offered to / List presented to:            Status of service:  Completed, signed off  ED Comments:   ED Comments Detail:

## 2013-05-04 NOTE — Interval H&P Note (Signed)
History and Physical Interval Note:  05/04/2013 5:41 PM  Caleb [name]  has presented today for surgery, with the diagnosis of cad  The various methods of treatment have been discussed with the patient and family. After consideration of risks, benefits and other options for treatment, the patient has consented to  Procedure(s): LEFT HEART CATHETERIZATION WITH CORONARY ANGIOGRAM (N/A) and possible PCI  as a surgical intervention .  The patient's history has been reviewed, patient examined, no change in status, stable for surgery.  I have reviewed the patient's chart and labs.  Questions were answered to the patient's satisfaction.   Cath Lab Visit (complete for each Cath Lab visit)  Clinical Evaluation Leading to the Procedure:   ACS: yes  Non-ACS:    Anginal Classification: CCS IV  Anti-ischemic medical therapy: No Therapy  Non-Invasive Test Results: No non-invasive testing performed  Prior CABG: No previous CABG        [facility]

## 2013-05-04 NOTE — ED Notes (Signed)
Pt made aware that [facility] in on the way for transport.

## 2013-05-04 NOTE — ED Notes (Signed)
Pt reports he took his 81mg  Aspirin this morning, no other medications taken.

## 2013-05-04 NOTE — Progress Notes (Signed)
ANTICOAGULATION CONSULT NOTE - Initial Consult  Pharmacy Consult for IV Heparin Indication: chest pain/ACS  No Known Allergies  Patient Measurements: Height: 5\' 9"  (175.3 cm) Weight: 175 lb (79.379 kg) IBW/kg (Calculated) : 70.7 Heparin Dosing Weight: 79 kg  Vital Signs: Temp: 98.3 F (36.8 C) (10/24 0641) Temp src: Oral (10/24 0641) BP: 118/76 mmHg (10/24 0950) Pulse Rate: 62 (10/24 0950)  Labs:  Recent Labs  05/04/13 0705  HGB 14.0  HCT 41.1  PLT 258  CREATININE 0.87    Estimated Creatinine Clearance: 89.2 ml/min (by C-G formula based on Cr of 0.87).   Medical History: Past Medical History  Diagnosis Date  . Diabetes mellitus      Assessment: Caleb [name] with PMH significant for DM presents with chest pain.  Starting IV heparin for unstable angina and transfer to [facility].  Baseline CBC, SCr, aPTT, PT/INR WNL.   Goal of Therapy:  Heparin level 0.3-0.7 units/ml Monitor platelets by anticoagulation protocol: Yes   Plan:   Heparin 4000 unit IV bolus x 1 followed by infusion starting at 1000 units/hr.  Clance Boll 05/04/2013,10:23 AM

## 2013-05-04 NOTE — ED Notes (Signed)
Have had heparin to hang, but there are no channels in dept. Administration notified.

## 2013-05-04 NOTE — ED Provider Notes (Signed)
CSN: [id number]     Arrival date & time 05/04/13  3244 History   First MD Initiated Contact with Patient 05/04/13 [phone]     Chief Complaint  Patient presents with  . Chest Pain  . Arm Pain    HPI PCP Dr. Clovis Riley, Family Medicine.  Patient presents with left-sided chest discomfort. He awakened at approximately 450 this morning. He felt tightness in his left chest. Tightness in his left upper arm. He went to work. He works as a Arboriculturist. He after that after sex. Still having the pain. He "he did not start work as he was having more discomfort does seem to be getting worse even with minimal exertion. He spoke with his boss and drove himself here. He took one baby aspirin at home. His pain has been essentially unchanged states it does wax and wane somewhat as been present since about 04 50 this morning. He denies shortness of breath. No nausea. He feels generally weak. No palpitations. No syncope or presyncope. No diaphoresis.  Past Medical History  Diagnosis Date  . Diabetes mellitus    History reviewed. No pertinent past surgical history. Family History  Problem Relation Age of Onset  . Cancer Mother   . Cancer Father   . Cancer Brother    History  Substance Use Topics  . Smoking status: Never Smoker   . Smokeless tobacco: Never Used  . Alcohol Use: No    Review of Systems  Constitutional: Negative for fever, chills, diaphoresis, appetite change and fatigue.  HENT: Negative for mouth sores, sore throat and trouble swallowing.   Eyes: Negative for visual disturbance.  Respiratory: Positive for chest tightness. Negative for cough, shortness of breath and wheezing.   Cardiovascular: Positive for chest pain.       Lt anterior chest.  Radiates to LUE-"tightness".  Compares it to the feeling of the BP cuff.  Gastrointestinal: Negative for nausea, vomiting, abdominal pain, diarrhea and abdominal distention.  Endocrine: Negative for polydipsia, polyphagia and polyuria.  Genitourinary:  Negative for dysuria, frequency and hematuria.  Musculoskeletal: Negative for gait problem.  Skin: Negative for color change, pallor and rash.  Neurological: Positive for weakness. Negative for dizziness, syncope, light-headedness and headaches.  Hematological: Does not bruise/bleed easily.  Psychiatric/Behavioral: Negative for behavioral problems and confusion.    Allergies  Review of patient's allergies indicates no known allergies.  Home Medications   Current Outpatient Rx  Name  Route  Sig  Dispense  Refill  . aspirin EC 81 MG tablet   Oral   Take 81 mg by mouth daily.         . pravastatin (PRAVACHOL) 40 MG tablet   Oral   Take 40 mg by mouth at bedtime.          BP 103/63  Pulse 71  Temp(Src) 98.3 F (36.8 C) (Oral)  Resp 28  SpO2 96% Physical Exam  Constitutional: He is oriented to person, place, and time. He appears well-developed and well-nourished. No distress.  HENT:  Head: Normocephalic.  Eyes: Conjunctivae are normal. Pupils are equal, round, and reactive to light. No scleral icterus.  Neck: Normal range of motion. Neck supple. No thyromegaly present.  Cardiovascular: Normal rate and regular rhythm.  Exam reveals no gallop and no friction rub.   Murmur heard.  Systolic murmur is present with a grade of 2/6  Pulmonary/Chest: Effort normal and breath sounds normal. No respiratory distress. He has no wheezes. He has no rales.  Abdominal: Soft. Bowel sounds  are normal. He exhibits no distension. There is no tenderness. There is no rebound.  Musculoskeletal: Normal range of motion.  Neurological: He is alert and oriented to person, place, and time.  Skin: Skin is warm and dry. No rash noted.  Psychiatric: He has a normal mood and affect. His behavior is normal.    ED Course  Procedures (including critical care time) Labs Review Labs Reviewed  CBC - Abnormal; Notable for the following:    MCV 76.3 (*)    All other components within normal limits  BASIC  METABOLIC PANEL - Abnormal; Notable for the following:    Sodium 134 (*)    Glucose, Bld 185 (*)    All other components within normal limits  GLUCOSE, CAPILLARY - Abnormal; Notable for the following:    Glucose-Capillary 223 (*)    All other components within normal limits  POCT I-STAT TROPONIN I   Imaging Review No results found.  EKG Interpretation   None       MDM   1. Unstable angina    EKG showed no acute changes. Troponin is normal. Pain improved after 3 sublingual nitroglycerin from an 8/10-2/10. Resolved after morphine and Zofran IV. I discussed the case with Dr. Adelfa Koh. Patient will be [facility]. Dr. Curlene Dolphin recommended heparin bolus and infusion. Nitroglycerin infusion. These are being started currently. The patient remains currently pain free. Last systolic blood pressure 101.    Roney Marion, MD 05/04/13 1154

## 2013-05-05 ENCOUNTER — Other Ambulatory Visit: Payer: Self-pay

## 2013-05-05 LAB — HEMOGLOBIN A1C
Hgb A1c MFr Bld: 6.1 % — ABNORMAL HIGH (ref <5.7)
Mean Plasma Glucose: 128 mg/dL — ABNORMAL HIGH (ref <117)

## 2013-05-05 LAB — GLUCOSE, CAPILLARY: Glucose-Capillary: 134 mg/dL — ABNORMAL HIGH (ref 70–99)

## 2013-05-05 LAB — TSH: TSH: 0.402 u[IU]/mL (ref 0.350–4.500)

## 2013-05-05 MED ORDER — OMEPRAZOLE MAGNESIUM 20 MG PO TBEC: 40.0000 mg | DELAYED_RELEASE_TABLET | Freq: Every day | ORAL | Status: DC

## 2013-05-05 NOTE — Progress Notes (Signed)
Pt had no signs of bleeding or oozing when TR band taken off and occlusive dressing applied. Rechecked site and  dressing had blood on it. Dr Jacinto Halim notified. TR band reapplied and re inflated with 3cc. Verbal order to deflate 1cc every 30 mins. Will continue to monitor.

## 2013-05-05 NOTE — Discharge Summary (Signed)
Physician Discharge Summary  Patient ID: Caleb [name] MRN: [id number] DOB/AGE: 61/15/1952 61 y.o.  Admit date: 05/04/2013 Discharge date: 05/05/2013  Primary Discharge Diagnosis Chest pain probably due to GERD Secondary Discharge Diagnosis Diabetes mellitus type 2 controlled   Significant Diagnostic Studies: 05/04/2013: Left ventricle: Performed in the RAO projection revealed LVEF of 65%. There was No MR. No wall motion abnormality.  Right coronary artery: The vessel is smooth, normal, Non-Dominant.  Left main coronary artery is large and normal.  Circumflex coronary artery: Is a dominant vessel A large vessel giving origin to a large obtuse marginal 1. It is smooth and normal. Distal RCA stress that distal circumflex coronary artery gives origin to 2 large PDA branches, which are tortuous but otherwise normal.  LAD: LAD gives origin to a large diagonal-1. Normal.  Impression: Normal coronary arteries with left dominant circulation with normal left systolic function.  Hospital Course: Patient was admitted via [facility] long hospital when he presented with chest discomfort when he was getting ready to go to work. Chest pain was relieved with sublingual nitroglycerin. Because of nitrate responsive chest pain, diabetes mellitus and hyperlipidemia, he was taken to the cardiac catheterization lab to evaluate his coronary anatomy, patient also perform invasive strategy. Coronary angiography revealed left dominant circulation, normal coronary arteries. The following morning patient was asymptomatic without any recurrence of chest pain. He was ruled out for myocardial infarction. He was felt stable for discharge.   Recommendations on discharge: He will follow up with Lupe Carney, MD., his PCP for further management. Consider addition of ACE inhibitor given his diabetic status. PPI was prescribed. Otherwise no change in the medications.  Discharge Exam: Blood pressure 93/66, pulse 71,  temperature 97.7 F (36.5 C), temperature source Oral, resp. rate 16, height 5\' 9"  (1.753 m), weight 85.3 kg (188 lb 0.8 oz), SpO2 98.00%.  General appearance: alert, cooperative, appears stated age and no distress  Eyes: negative findings: lids and lashes normal and conjunctivae and sclerae normal  Neck: no adenopathy, no carotid bruit, no JVD, supple, symmetrical, trachea midline and thyroid not enlarged, symmetric, no tenderness/mass/nodules  Neck: JVP - normal, carotids 2+= without bruits  Resp: clear to auscultation bilaterally  Chest wall: no tenderness  Cardio: regular rate and rhythm, S1, S2 normal, no murmur, click, rub or gallop  GI: soft, non-tender; bowel sounds normal; no masses, no organomegaly  Extremities: extremities normal, atraumatic, no cyanosis or edema  Pulses: 2+ and symmetric, right radial arterial access site without hematoma and preserved pulse.   Labs:   Lab Results  Component Value Date   WBC 5.1 05/04/2013   HGB 14.0 05/04/2013   HCT 41.1 05/04/2013   MCV 76.3* 05/04/2013   PLT 258 05/04/2013    Recent Labs Lab 05/04/13 0705  NA 134*  K 4.0  CL 100  CO2 24  BUN 13  CREATININE 0.87  CALCIUM 9.4  GLUCOSE 185*   Cardiac Panel (last 3 results)  Recent Labs  05/04/13 1620 05/05/13 0430  TROPONINI <0.30 <0.30   HbA1c 05/04/2013: 6.1%. TSH 0.402, normal  Lipid Panel     Component Value Date/Time   CHOL  Value: 147        ATP III CLASSIFICATION:  <200     mg/dL   Desirable  409-811  mg/dL   Borderline High  >=914    mg/dL   High        7/82/9562 0520   TRIG 145 02/05/2009 0520   HDL 24* 02/05/2009 0520  CHOLHDL 6.1 02/05/2009 0520   VLDL 29 02/05/2009 0520   LDLCALC  Value: 94        Total Cholesterol/HDL:CHD Risk Coronary Heart Disease Risk Table                     Men   Women  1/2 Average Risk   3.4   3.3  Average Risk       5.0   4.4  2 X Average Risk   9.6   7.1  3 X Average Risk  23.4   11.0        Use the calculated Patient Ratio above  and the CHD Risk Table to determine the patient's CHD Risk.        ATP III CLASSIFICATION (LDL):  <100     mg/dL   Optimal  409-811  mg/dL   Near or Above                    Optimal  130-159  mg/dL   Borderline  914-782  mg/dL   High  >956     mg/dL   Very High 08/25/863 7846    EKG: normal EKG, normal sinus rhythm, unchanged from previous tracings.  FOLLOW UP PLANS AND APPOINTMENTS    Medication List         aspirin EC 81 MG tablet  Take 81 mg by mouth daily.     omeprazole 20 MG tablet  Commonly known as:  PRILOSEC OTC  Take 2 tablets (40 mg total) by mouth daily.     pravastatin 40 MG tablet  Commonly known as:  PRAVACHOL  Take 40 mg by mouth at bedtime.           Follow-up Information   Call Lupe Carney, MD. (To be seen in 2 weeks)    Specialty:  Family Medicine   Contact information:   [address] [place] [address] [phone]      Pamella Pert, MD 05/05/2013, 9:59 AM  Pager: [phone] Office: [phone] If no answer: [phone]

## 2013-05-08 ENCOUNTER — Emergency Department (HOSPITAL_COMMUNITY)
Admission: EM | Admit: 2013-05-08 | Discharge: 2013-05-08 | Disposition: A | Discharge status: Home / Self Care | Payer: BC Managed Care – PPO | Attending: Emergency Medicine | Admitting: Emergency Medicine

## 2013-05-08 ENCOUNTER — Encounter (HOSPITAL_COMMUNITY): Payer: Self-pay | Admitting: Emergency Medicine

## 2013-05-08 DIAGNOSIS — X503XXA Overexertion from repetitive movements, initial encounter: Secondary | ICD-10-CM | POA: Insufficient documentation

## 2013-05-08 DIAGNOSIS — E119 Type 2 diabetes mellitus without complications: Secondary | ICD-10-CM | POA: Insufficient documentation

## 2013-05-08 DIAGNOSIS — Z7982 Long term (current) use of aspirin: Secondary | ICD-10-CM | POA: Insufficient documentation

## 2013-05-08 DIAGNOSIS — Y9389 Activity, other specified: Secondary | ICD-10-CM | POA: Insufficient documentation

## 2013-05-08 DIAGNOSIS — E7800 Pure hypercholesterolemia, unspecified: Secondary | ICD-10-CM | POA: Insufficient documentation

## 2013-05-08 DIAGNOSIS — Z9861 Coronary angioplasty status: Secondary | ICD-10-CM | POA: Insufficient documentation

## 2013-05-08 DIAGNOSIS — S298XXA Other specified injuries of thorax, initial encounter: Secondary | ICD-10-CM | POA: Insufficient documentation

## 2013-05-08 DIAGNOSIS — Z79899 Other long term (current) drug therapy: Secondary | ICD-10-CM | POA: Insufficient documentation

## 2013-05-08 DIAGNOSIS — X500XXA Overexertion from strenuous movement or load, initial encounter: Secondary | ICD-10-CM | POA: Insufficient documentation

## 2013-05-08 DIAGNOSIS — T148XXA Other injury of unspecified body region, initial encounter: Secondary | ICD-10-CM

## 2013-05-08 DIAGNOSIS — Y9289 Other specified places as the place of occurrence of the external cause: Secondary | ICD-10-CM | POA: Insufficient documentation

## 2013-05-08 DIAGNOSIS — IMO0002 Reserved for concepts with insufficient information to code with codable children: Secondary | ICD-10-CM | POA: Insufficient documentation

## 2013-05-08 DIAGNOSIS — Z87891 Personal history of nicotine dependence: Secondary | ICD-10-CM | POA: Insufficient documentation

## 2013-05-08 MED ORDER — OXYCODONE-ACETAMINOPHEN 5-325 MG PO TABS: 1.0000 | ORAL_TABLET | ORAL | Status: DC | PRN

## 2013-05-08 MED ORDER — MORPHINE SULFATE 4 MG/ML IJ SOLN
6.0000 mg | Freq: Once | INTRAMUSCULAR | Status: AC
  Administered 2013-05-08: 6 mg via INTRAMUSCULAR
  Filled 2013-05-08: qty 2

## 2013-05-08 MED ORDER — DIAZEPAM 5 MG PO TABS: 5.0000 mg | ORAL_TABLET | Freq: Three times a day (TID) | ORAL | Status: DC | PRN

## 2013-05-08 MED ORDER — IBUPROFEN 200 MG PO TABS
600.0000 mg | ORAL_TABLET | Freq: Once | ORAL | Status: AC
  Administered 2013-05-08: 600 mg via ORAL
  Filled 2013-05-08: qty 3

## 2013-05-08 MED ORDER — DIAZEPAM 5 MG PO TABS
5.0000 mg | ORAL_TABLET | Freq: Once | ORAL | Status: AC
  Administered 2013-05-08: 5 mg via ORAL
  Filled 2013-05-08: qty 1

## 2013-05-08 MED ORDER — IBUPROFEN 600 MG PO TABS: 600.0000 mg | ORAL_TABLET | Freq: Four times a day (QID) | ORAL | Status: DC | PRN

## 2013-05-08 NOTE — ED Notes (Signed)
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 Pain upon palpitation, able to move finger, good distal pulses. Denies numbness and tingling in right hand.

## 2013-05-08 NOTE — ED Provider Notes (Signed)
CSN: [id number]     Arrival date & time 05/08/13  4696 History   First MD Initiated Contact with Patient 05/08/13 [phone]     Chief Complaint  Patient presents with  . Shoulder Pain    right   (Consider location/radiation/quality/duration/timing/severity/associated sxs/prior Treatment) HPI  62 year old male with right shoulder pain. Onset this morning is is looking [place]. Persistent since then. Pain is worse with range of motion restricted shoulder. No numbness or tingling. Denies any significant pain anywhere else. No respiratory complaints. No fevers or chills. No initially pain or swelling. No intervention prior to arrival.  Past Medical History  Diagnosis Date  . High cholesterol   . Type II diabetes mellitus    Past Surgical History  Procedure Laterality Date  . No past surgeries     Family History  Problem Relation Age of Onset  . Cancer Mother   . Cancer Father   . Cancer Brother    History  Substance Use Topics  . Smoking status: Former Smoker -- 0.12 packs/day for 20 years    Types: Cigarettes  . Smokeless tobacco: Never Used     Comment: 05/04/2013 "quit smoking ~ 1991"  . Alcohol Use: No    Review of Systems All systems reviewed and negative, other than as noted in HPI.   Allergies  Review of patient's allergies indicates no known allergies.  Home Medications   Current Outpatient Rx  Name  Route  Sig  Dispense  Refill  . aspirin EC 81 MG tablet   Oral   Take 81 mg by mouth daily.         Marland Kitchen omeprazole (PRILOSEC OTC) 20 MG tablet   Oral   Take 2 tablets (40 mg total) by mouth daily.   60 tablet   0   . pravastatin (PRAVACHOL) 40 MG tablet   Oral   Take 40 mg by mouth at bedtime.         . diazepam (VALIUM) 5 MG tablet   Oral   Take 1 tablet (5 mg total) by mouth every 8 (eight) hours as needed (muscle spasm).   12 tablet   0   . ibuprofen (ADVIL,MOTRIN) 600 MG tablet   Oral   Take 1 tablet (600 mg total) by mouth every 6 (six) hours  as needed for pain.   30 tablet   0   . oxyCODONE-acetaminophen (PERCOCET/ROXICET) 5-325 MG per tablet   Oral   Take 1-2 tablets by mouth every 4 (four) hours as needed for pain.   12 tablet   0    BP 154/79  Pulse 59  Temp(Src) 98 F (36.7 C) (Oral)  Resp 16  SpO2 99% Physical Exam  Nursing note and vitals reviewed. Constitutional: He appears well-developed and well-nourished. No distress.  HENT:  Head: Normocephalic and atraumatic.  Eyes: Conjunctivae are normal. Right eye exhibits no discharge. Left eye exhibits no discharge.  Neck: Neck supple.  Cardiovascular: Normal rate, regular rhythm and normal heart sounds.  Exam reveals no gallop and no friction rub.   No murmur heard. Pulmonary/Chest: Effort normal and breath sounds normal. No respiratory distress.  Abdominal: Soft. He exhibits no distension. There is no tenderness.  Musculoskeletal: He exhibits no edema and no tenderness.  Mild tenderness to palpation R shoulder and R trapezius. No overlying skin changes. Pain reproduced with ROM of R shoulder. No deformity. NVI disitally.   Neurological: He is alert.  Skin: Skin is warm and dry.  Psychiatric: He has  a normal mood and affect. His behavior is normal. Thought content normal.    ED Course  Procedures (including critical care time) Labs Review Labs Reviewed - No data to display Imaging Review No results found.  EKG Interpretation     Ventricular Rate:  61 PR Interval:  188 QRS Duration: 91 QT Interval:  371 QTC Calculation: 374 R Axis:   54 Text Interpretation:  Sinus rhythm ST elevation, consider inferior injury ED PHYSICIAN INTERPRETATION AVAILABLE IN [facility]            MDM   1. Muscle strain    62 year old male with back and chest pain. History and exam consistent with muscular strain. Normal cardiac catheterization 4 days ago. Doubt dissection. Plan symptomatic treatment. Return precautions discussed.    Raeford Razor,  MD 05/16/13 [phone]

## 2013-11-08 DIAGNOSIS — Z87891 Personal history of nicotine dependence: Secondary | ICD-10-CM | POA: Insufficient documentation

## 2013-11-08 DIAGNOSIS — F419 Anxiety disorder, unspecified: Secondary | ICD-10-CM | POA: Insufficient documentation

## 2014-02-05 DIAGNOSIS — H9190 Unspecified hearing loss, unspecified ear: Secondary | ICD-10-CM | POA: Insufficient documentation

## 2014-04-02 DIAGNOSIS — F4312 Post-traumatic stress disorder, chronic: Secondary | ICD-10-CM | POA: Insufficient documentation

## 2014-06-20 ENCOUNTER — Encounter (HOSPITAL_COMMUNITY): Payer: Self-pay | Admitting: Cardiology

## 2014-08-20 ENCOUNTER — Ambulatory Visit
Admission: RE | Admit: 2014-08-20 | Discharge: 2014-08-20 | Disposition: A | Discharge status: Home / Self Care | Payer: BC Managed Care – PPO | Source: Ambulatory Visit | Attending: Physician Assistant | Admitting: Physician Assistant

## 2014-08-20 ENCOUNTER — Other Ambulatory Visit: Payer: Self-pay | Admitting: Physician Assistant

## 2014-08-20 DIAGNOSIS — R079 Chest pain, unspecified: Secondary | ICD-10-CM

## 2016-05-08 ENCOUNTER — Emergency Department (HOSPITAL_COMMUNITY): Payer: BC Managed Care – PPO

## 2016-05-08 ENCOUNTER — Encounter (HOSPITAL_COMMUNITY): Payer: Self-pay | Admitting: Emergency Medicine

## 2016-05-08 ENCOUNTER — Emergency Department (HOSPITAL_COMMUNITY)
Admission: EM | Admit: 2016-05-08 | Discharge: 2016-05-08 | Disposition: A | Discharge status: Home / Self Care | Payer: BC Managed Care – PPO | Attending: Emergency Medicine | Admitting: Emergency Medicine

## 2016-05-08 DIAGNOSIS — Z7982 Long term (current) use of aspirin: Secondary | ICD-10-CM | POA: Diagnosis not present

## 2016-05-08 DIAGNOSIS — Z7984 Long term (current) use of oral hypoglycemic drugs: Secondary | ICD-10-CM | POA: Diagnosis not present

## 2016-05-08 DIAGNOSIS — R079 Chest pain, unspecified: Secondary | ICD-10-CM | POA: Diagnosis not present

## 2016-05-08 DIAGNOSIS — E119 Type 2 diabetes mellitus without complications: Secondary | ICD-10-CM | POA: Diagnosis not present

## 2016-05-08 DIAGNOSIS — Z87891 Personal history of nicotine dependence: Secondary | ICD-10-CM | POA: Insufficient documentation

## 2016-05-08 LAB — CBC
HCT: 40.2 % (ref 39.0–52.0)
Hemoglobin: 13.5 g/dL (ref 13.0–17.0)
MCH: 25.3 pg — ABNORMAL LOW (ref 26.0–34.0)
MCHC: 33.6 g/dL (ref 30.0–36.0)
MCV: 75.4 fL — ABNORMAL LOW (ref 78.0–100.0)
PLATELETS: 296 10*3/uL (ref 150–400)
RBC: 5.33 MIL/uL (ref 4.22–5.81)
RDW: 16.3 % — ABNORMAL HIGH (ref 11.5–15.5)
WBC: 6.2 10*3/uL (ref 4.0–10.5)

## 2016-05-08 LAB — BASIC METABOLIC PANEL
Anion gap: 8 (ref 5–15)
BUN: 12 mg/dL (ref 6–20)
CALCIUM: 9 mg/dL (ref 8.9–10.3)
CO2: 23 mmol/L (ref 22–32)
CREATININE: 0.88 mg/dL (ref 0.61–1.24)
Chloride: 105 mmol/L (ref 101–111)
Glucose, Bld: 203 mg/dL — ABNORMAL HIGH (ref 65–99)
Potassium: 3.7 mmol/L (ref 3.5–5.1)
SODIUM: 136 mmol/L (ref 135–145)

## 2016-05-08 LAB — I-STAT TROPONIN, ED
TROPONIN I, POC: 0 ng/mL (ref 0.00–0.08)
Troponin i, poc: 0 ng/mL (ref 0.00–0.08)

## 2016-05-08 LAB — D-DIMER, QUANTITATIVE (NOT AT ARMC): D DIMER QUANT: 2.43 ug{FEU}/mL — AB (ref 0.00–0.50)

## 2016-05-08 MED ORDER — ASPIRIN 325 MG PO TABS
325.0000 mg | ORAL_TABLET | Freq: Once | ORAL | Status: AC
  Administered 2016-05-08: 325 mg via ORAL
  Filled 2016-05-08: qty 1

## 2016-05-08 MED ORDER — IOPAMIDOL (ISOVUE-370) INJECTION 76%
100.0000 mL | Freq: Once | INTRAVENOUS | Status: AC | PRN
  Administered 2016-05-08: 80 mL via INTRAVENOUS

## 2016-05-08 MED ORDER — IOPAMIDOL (ISOVUE-370) INJECTION 76%: 100.0000 mL | Freq: Once | INTRAVENOUS | Status: DC | PRN

## 2016-05-08 MED ORDER — NITROGLYCERIN 0.4 MG SL SUBL
0.4000 mg | SUBLINGUAL_TABLET | SUBLINGUAL | Status: DC | PRN
  Filled 2016-05-08: qty 1

## 2016-05-08 NOTE — ED Notes (Signed)
Verbalized understanding discharge instructions and follow-up. In no acute distress.   

## 2016-05-08 NOTE — ED Triage Notes (Signed)
Patient c/o right sided chest pain that started this morning while sleeping.  Patient states that pain has gotten worse since day has gone on.  Patient denies radiation or n/v.

## 2016-05-08 NOTE — ED Provider Notes (Signed)
[facility]-EMERGENCY DEPT Provider Note   CSN: [id number] Arrival date & time: 05/08/16  1111     History   Chief Complaint Chief Complaint  Patient presents with  . Chest Pain    HPI Caleb [name] is a 65 y.o. male with a past medical history significant for hypercholesterolemia and diabetes who presents with chest pain. Patient reports that for the last 3 days, he has had some chest pain in his right chest. He reports that it travels across his chest to the central area. He denies any back pain. He says that he is not any recent trauma, any recent fevers, chills, cough, or shortness of breath. He denies any exertion component and he is unsure if there is a pleuritic component. He denies any palpitations or syncope. He reports that he is a former smoker but quit in the 1990s. He denies any history of cardiac disease or any family history of cardiac disease. He describes the pain as an 8 out of 10 severity and located in the right chest. He denies any history of DVT or PE. He has not taken any medicine to help her symptoms.    The history is provided by the patient and medical records. No language interpreter was used.  Chest Pain   This is a new problem. The current episode started more than 2 days ago. The problem occurs constantly. The problem has been gradually improving. The pain is present in the lateral region. The pain is at a severity of 8/10. The pain is severe. The quality of the pain is described as sharp and burning. The pain does not radiate (radiates to central chest). Pertinent negatives include no abdominal pain, no back pain, no cough, no diaphoresis, no dizziness, no exertional chest pressure, no fever, no headaches, no hemoptysis, no irregular heartbeat, no leg pain, no lower extremity edema, no nausea, no palpitations, no shortness of breath, no sputum production and no vomiting. He has tried nothing for the symptoms. Risk factors include male gender.  His past medical  history is significant for hyperlipidemia and hypertension.  Pertinent negatives for past medical history include no CAD and no pacemaker.  Pertinent negatives for family medical history include: no CAD.    Past Medical History:  Diagnosis Date  . High cholesterol   . Type II diabetes mellitus (HCC)     There are no active problems to display for this patient.   Past Surgical History:  Procedure Laterality Date  . LEFT HEART CATHETERIZATION WITH CORONARY ANGIOGRAM N/A 05/04/2013   Procedure: LEFT HEART CATHETERIZATION WITH CORONARY ANGIOGRAM;  Surgeon: Pamella PertJagadeesh R Ganji, MD;  [facility];  Service: Cardiovascular;  Laterality: N/A;  . NO PAST SURGERIES         Home Medications    Prior to Admission medications   Medication Sig Start Date End Date Taking? Authorizing Provider  aspirin EC 81 MG tablet Take 81 mg by mouth daily.    Historical Provider, MD  diazepam (VALIUM) 5 MG tablet Take 1 tablet (5 mg total) by mouth every 8 (eight) hours as needed (muscle spasm). 05/08/13   Raeford RazorStephen Kohut, MD  ibuprofen (ADVIL,MOTRIN) 600 MG tablet Take 1 tablet (600 mg total) by mouth every 6 (six) hours as needed for pain. 05/08/13   Raeford RazorStephen Kohut, MD  omeprazole (PRILOSEC OTC) 20 MG tablet Take 2 tablets (40 mg total) by mouth daily. 05/05/13   Yates DecampJay Ganji, MD  oxyCODONE-acetaminophen (PERCOCET/ROXICET) 5-325 MG per tablet Take 1-2 tablets by mouth  every 4 (four) hours as needed for pain. 05/08/13   Raeford Razor, MD  pravastatin (PRAVACHOL) 40 MG tablet Take 40 mg by mouth at bedtime.    Historical Provider, MD    Family History Family History  Problem Relation Age of Onset  . Cancer Mother   . Cancer Father   . Cancer Brother     Social History Social History  Substance Use Topics  . Smoking status: Former Smoker    Packs/day: 0.12    Years: 20.00    Types: Cigarettes  . Smokeless tobacco: Never Used     Comment: 05/04/2013 "quit smoking ~ 1991"  . Alcohol use No      Allergies   Simvastatin   Review of Systems Review of Systems  Constitutional: Negative for chills, diaphoresis, fatigue and fever.  HENT: Negative for congestion and rhinorrhea.   Respiratory: Negative for cough, hemoptysis, sputum production, chest tightness, shortness of breath, wheezing and stridor.   Cardiovascular: Positive for chest pain. Negative for palpitations and leg swelling.  Gastrointestinal: Negative for abdominal pain, constipation, diarrhea, nausea and vomiting.  Genitourinary: Negative for dysuria and flank pain.  Musculoskeletal: Negative for back pain, neck pain and neck stiffness.  Skin: Negative for rash and wound.  Neurological: Negative for dizziness, syncope, light-headedness and headaches.  Psychiatric/Behavioral: Negative for agitation.  All other systems reviewed and are negative.    Physical Exam Updated Vital Signs BP 156/87 (BP Location: Left Arm)   Pulse 80   Temp 97.6 F (36.4 C) (Oral)   Resp 20   Ht 5\' 9"  (1.753 m)   Wt 205 lb (93 kg)   SpO2 98%   BMI 30.27 kg/m   Physical Exam  Constitutional: He is oriented to person, place, and time. He appears well-developed and well-nourished.  HENT:  Head: Normocephalic and atraumatic.  Mouth/Throat: Oropharynx is clear and moist. No oropharyngeal exudate.  Eyes: Conjunctivae and EOM are normal. Pupils are equal, round, and reactive to light.  Neck: Normal range of motion. Neck supple.  Cardiovascular: Normal rate and regular rhythm.   No murmur heard. Pulmonary/Chest: Effort normal and breath sounds normal. No stridor. No respiratory distress. He exhibits no tenderness.  Abdominal: Soft. There is no tenderness.  Musculoskeletal: He exhibits no edema or tenderness.  Neurological: He is alert and oriented to person, place, and time. He has normal reflexes. He displays normal reflexes. No cranial nerve deficit. He exhibits normal muscle tone. Coordination normal.  Skin: Skin is warm and  dry. No erythema.  Psychiatric: He has a normal mood and affect.  Nursing note and vitals reviewed.    ED Treatments / Results  Labs (all labs ordered are listed, but only abnormal results are displayed) Labs Reviewed  BASIC METABOLIC PANEL - Abnormal; Notable for the following:       Result Value   Glucose, Bld 203 (*)    All other components within normal limits  CBC - Abnormal; Notable for the following:    MCV 75.4 (*)    MCH 25.3 (*)    RDW 16.3 (*)    All other components within normal limits  I-STAT TROPOININ, ED    EKG  EKG Interpretation  Date/Time:  Saturday May 08 2016 11:21:45 EDT Ventricular Rate:  82 PR Interval:    QRS Duration: 100 QT Interval:  349 QTC Calculation: 408 R Axis:   53 Text Interpretation:  Sinus rhythm Probable left atrial enlargement Appears similar to prior NO STEMI Confirmed by Rush Landmark MD,  CHRISTOPHER [phone]([id number]) on 05/08/2016 11:32:12 AM       Radiology Dg Chest 2 View  Result Date: 05/08/2016 CLINICAL DATA:  Right-sided chest pain and numbness radiating to right shoulder and right arm. Symptoms since 3 a.m. EXAM: CHEST  2 VIEW COMPARISON:  08/20/2014 FINDINGS: Normal heart size. Lungs clear. No pneumothorax. No pleural effusion. IMPRESSION: No active cardiopulmonary disease. Electronically Signed   By: Jolaine ClickArthur  Hoss M.D.   On: 05/08/2016 11:51   Ct Angio Chest Pe W And/or Wo Contrast  Result Date: 05/08/2016 CLINICAL DATA:  Right-sided chest pain. Evaluate for pulmonary embolus. EXAM: CT ANGIOGRAPHY CHEST WITH CONTRAST TECHNIQUE: Multidetector CT imaging of the chest was performed using the standard protocol during bolus administration of intravenous contrast. Multiplanar CT image reconstructions and MIPs were obtained to evaluate the vascular anatomy. CONTRAST:  80 mm of Isovue-300 COMPARISON:  Chest x-ray from earlier today FINDINGS: Cardiovascular: Evaluation of the ascending thoracic aorta is limited due to cardiac motion. However,  within this limitation, no aneurysm or dissection is identified. Minimal atherosclerotic changes seen in the arch. No pulmonary emboli are identified. The heart size is normal in appearance. No pleural or pericardial effusions identified. Mediastinum/Nodes: No enlarged mediastinal, hilar, or axillary lymph nodes. Thyroid gland, trachea, and esophagus demonstrate no significant findings. Lungs/Pleura: The central airways are within normal limits. No pneumothorax. A thin walled pneumatocele is seen in the right lung base. Mild dependent atelectasis identified. No suspicious pulmonary nodules, masses, or focal infiltrates. Upper Abdomen: No acute abnormality. Musculoskeletal: No chest wall abnormality. No acute or significant osseous findings. Review of the MIP images confirms the above findings. IMPRESSION: 1. No pulmonary emboli or acute abnormality. No cause for right-sided chest pain identified. Electronically Signed   By: Gerome Samavid  [name] III M.D   On: 05/08/2016 16:39    Procedures Procedures (including critical care time)  Medications Ordered in ED Medications  aspirin tablet 325 mg (325 mg Oral Given 05/08/16 1505)  iopamidol (ISOVUE-370) 76 % injection 100 mL (80 mLs Intravenous Contrast Given 05/08/16 1607)     Initial Impression / Assessment and Plan / ED Course  I have reviewed the triage vital signs and the nursing notes.  Pertinent labs & imaging results that were available during my care of the patient were reviewed by me and considered in my medical decision making (see chart for details).  Clinical Course    Caleb RamsClifton Tholl is a 65 y.o. male with a past medical history significant for hypercholesterolemia and diabetes who presents with chest pain.  History and exam are seen above.  Patient's lungs clear to auscultation and chest was nontender. Abdomen was nontender and back was nontender. Patient had unremarkable neurologic exam. No leg tenderness or leg swelling.   Patient  had EKG which appear similar to prior. Patient had negative troponin 2 however his d-dimer was elevated.  Chest x-ray showed no acute cardiopulmonary disease. Normal white blood cell count and Hgb.    Patient will have CT PE study to look for pulmonary embolism.  PE study negative for her mallet. Hear score was a 2.   Given negative troponins, resolution of chest pain, and negative CT scan, feel patient is prepared for discharge. Patient given instructions to follow with PCP and likely cardiologist. Patient given return precautions for any new or worsening symptoms. Patient had no other questions or concerns and was discharged in good condition.   Final Clinical Impressions(s) / ED Diagnoses   Final diagnoses:  Chest pain, unspecified type  New Prescriptions New Prescriptions   No medications on file    Clinical Impression: 1. Chest pain, unspecified type     Disposition: Discharge  Condition: Good  I have discussed the results, Dx and Tx plan with the pt(& family if present). He/she/they expressed understanding and agree(s) with the plan. Discharge instructions discussed at great length. Strict return precautions discussed and pt &/or family have verbalized understanding of the instructions. No further questions at time of discharge.    New Prescriptions   No medications on file    Follow Up: L.Lupe Carney, MD [address] [place] [address] [phone]  Schedule an appointment as soon as possible for a visit    [facility] DE[facility] [address] [id number] mc [place] [address] [phone]  If symptoms worsen     Heide Scales, MD 05/08/16 2047

## 2016-05-08 NOTE — ED Notes (Signed)
Pt refused Nitro at this time.  Pt seem anxious to be discharged.

## 2016-05-08 NOTE — ED Notes (Signed)
Pt given a sandwich and soda. 

## 2017-03-18 DIAGNOSIS — E7800 Pure hypercholesterolemia, unspecified: Secondary | ICD-10-CM | POA: Diagnosis not present

## 2017-03-18 DIAGNOSIS — Z23 Encounter for immunization: Secondary | ICD-10-CM | POA: Diagnosis not present

## 2017-03-18 DIAGNOSIS — Z0000 Encounter for general adult medical examination without abnormal findings: Secondary | ICD-10-CM | POA: Diagnosis not present

## 2017-03-18 DIAGNOSIS — E1165 Type 2 diabetes mellitus with hyperglycemia: Secondary | ICD-10-CM | POA: Diagnosis not present

## 2017-04-27 DIAGNOSIS — M542 Cervicalgia: Secondary | ICD-10-CM | POA: Diagnosis not present

## 2017-04-27 DIAGNOSIS — M79602 Pain in left arm: Secondary | ICD-10-CM | POA: Diagnosis not present

## 2017-06-29 DIAGNOSIS — R079 Chest pain, unspecified: Secondary | ICD-10-CM | POA: Diagnosis not present

## 2017-09-15 DIAGNOSIS — R42 Dizziness and giddiness: Secondary | ICD-10-CM | POA: Diagnosis not present

## 2017-09-15 DIAGNOSIS — E119 Type 2 diabetes mellitus without complications: Secondary | ICD-10-CM | POA: Diagnosis not present

## 2017-09-15 DIAGNOSIS — E7800 Pure hypercholesterolemia, unspecified: Secondary | ICD-10-CM | POA: Diagnosis not present

## 2017-09-15 DIAGNOSIS — J309 Allergic rhinitis, unspecified: Secondary | ICD-10-CM | POA: Diagnosis not present

## 2017-09-18 ENCOUNTER — Encounter (HOSPITAL_COMMUNITY): Payer: Self-pay

## 2017-09-18 ENCOUNTER — Other Ambulatory Visit: Payer: Self-pay

## 2017-09-18 ENCOUNTER — Emergency Department (HOSPITAL_COMMUNITY)
Admission: EM | Admit: 2017-09-18 | Discharge: 2017-09-18 | Disposition: A | Discharge status: Home / Self Care | Payer: Medicare Other | Attending: Emergency Medicine | Admitting: Emergency Medicine

## 2017-09-18 ENCOUNTER — Emergency Department (HOSPITAL_COMMUNITY): Payer: Medicare Other

## 2017-09-18 DIAGNOSIS — S40011A Contusion of right shoulder, initial encounter: Secondary | ICD-10-CM

## 2017-09-18 DIAGNOSIS — Y939 Activity, unspecified: Secondary | ICD-10-CM | POA: Insufficient documentation

## 2017-09-18 DIAGNOSIS — W010XXA Fall on same level from slipping, tripping and stumbling without subsequent striking against object, initial encounter: Secondary | ICD-10-CM | POA: Diagnosis not present

## 2017-09-18 DIAGNOSIS — Z7984 Long term (current) use of oral hypoglycemic drugs: Secondary | ICD-10-CM | POA: Diagnosis not present

## 2017-09-18 DIAGNOSIS — M25511 Pain in right shoulder: Secondary | ICD-10-CM | POA: Diagnosis not present

## 2017-09-18 DIAGNOSIS — S4991XA Unspecified injury of right shoulder and upper arm, initial encounter: Secondary | ICD-10-CM | POA: Diagnosis not present

## 2017-09-18 DIAGNOSIS — Z87891 Personal history of nicotine dependence: Secondary | ICD-10-CM | POA: Diagnosis not present

## 2017-09-18 DIAGNOSIS — Y999 Unspecified external cause status: Secondary | ICD-10-CM | POA: Insufficient documentation

## 2017-09-18 DIAGNOSIS — W19XXXA Unspecified fall, initial encounter: Secondary | ICD-10-CM

## 2017-09-18 DIAGNOSIS — S5001XA Contusion of right elbow, initial encounter: Secondary | ICD-10-CM | POA: Diagnosis not present

## 2017-09-18 DIAGNOSIS — E119 Type 2 diabetes mellitus without complications: Secondary | ICD-10-CM | POA: Insufficient documentation

## 2017-09-18 DIAGNOSIS — M25521 Pain in right elbow: Secondary | ICD-10-CM | POA: Diagnosis not present

## 2017-09-18 DIAGNOSIS — Z7982 Long term (current) use of aspirin: Secondary | ICD-10-CM | POA: Diagnosis not present

## 2017-09-18 DIAGNOSIS — S59901A Unspecified injury of right elbow, initial encounter: Secondary | ICD-10-CM | POA: Diagnosis not present

## 2017-09-18 DIAGNOSIS — Y929 Unspecified place or not applicable: Secondary | ICD-10-CM | POA: Diagnosis not present

## 2017-09-18 MED ORDER — CYCLOBENZAPRINE HCL 5 MG PO TABS: 5.0000 mg | ORAL_TABLET | Freq: Three times a day (TID) | ORAL | 0 refills | Status: DC | PRN

## 2017-09-18 MED ORDER — DICLOFENAC SODIUM 50 MG PO TBEC: 50.0000 mg | DELAYED_RELEASE_TABLET | Freq: Two times a day (BID) | ORAL | 0 refills | Status: DC

## 2017-09-18 NOTE — Discharge Instructions (Signed)
Your x-rays did not show fractures or dislocations of the shoulder or elbow. Wear the sling for comfort and take the medications as directed. The muscle relaxer can cause you to be sleepy. Do not drive while taking it. Follow up with your doctor in the next few days for recheck. If the pain continues you may need additional imaging to look for other injuries such as rotator cuff injury.

## 2017-09-18 NOTE — ED Triage Notes (Signed)
He states he tripped (didn't pass out) and fell, landing on his right shoulder/upper arm area. He  C/o right shoulder and elbow pain. He is in no distress.

## 2017-09-18 NOTE — ED Provider Notes (Signed)
[facility]-EMERGENCY DEPT Provider Note   CSN: [id number] Arrival date & time: 09/18/17  1619     History   Chief Complaint Chief Complaint  Patient presents with  . Fall    HPI Caleb [name] is a 67 y.o. male who presents to the ED with c/o right shoulder and upper arm and elbow pain s/p fall. Patient reports that he tripped and fell. Patient denies LOC or other injuries. The fall happened earlier in the day.   HPI  Past Medical History:  Diagnosis Date  . High cholesterol   . Type II diabetes mellitus (HCC)     There are no active problems to display for this patient.   Past Surgical History:  Procedure Laterality Date  . LEFT HEART CATHETERIZATION WITH CORONARY ANGIOGRAM N/A 05/04/2013   Procedure: LEFT HEART CATHETERIZATION WITH CORONARY ANGIOGRAM;  Surgeon: Pamella Pert, MD;  Location: [facility] C[facility] LAB;  Service: Cardiovascular;  Laterality: N/A;  . NO PAST SURGERIES         Home Medications    Prior to Admission medications   Medication Sig Start Date End Date Taking? Authorizing Provider  aspirin EC 81 MG tablet Take 81 mg by mouth daily.    [provider]  cyclobenzaprine (FLEXERIL) 5 MG tablet Take 1 tablet (5 mg total) by mouth 3 (three) times daily as needed for muscle spasms. 09/18/17   Janne Napoleon, NP  diclofenac (VOLTAREN) 50 MG EC tablet Take 1 tablet (50 mg total) by mouth 2 (two) times daily. 09/18/17   Janne Napoleon, NP  fexofenadine (ALLEGRA) 180 MG tablet Take 180 mg by mouth daily.  04/13/16   [provider]  metFORMIN (GLUCOPHAGE) 1000 MG tablet Take 1 tablet by mouth 2 (two) times daily. 04/19/16   [provider]  pravastatin (PRAVACHOL) 40 MG tablet Take 40 mg by mouth at bedtime.    [provider]    Family History Family History  Problem Relation Age of Onset  . Cancer Mother   . Cancer Father   . Cancer Brother     Social History Social History   Tobacco Use  .  Smoking status: Former Smoker    Packs/day: 0.12    Years: 20.00    Pack years: 2.40    Types: Cigarettes  . Smokeless tobacco: Never Used  . Tobacco comment: 05/04/2013 "quit smoking ~ 1991"  Substance Use Topics  . Alcohol use: No  . Drug use: No     Allergies   Simvastatin   Review of Systems Review of Systems  Musculoskeletal: Positive for arthralgias.       Shoulder and elbow pain     Physical Exam Updated Vital Signs BP (!) 148/102   Pulse 85   Temp 98.5 F (36.9 C) (Oral)   Resp 16   SpO2 100%   Physical Exam  Constitutional: He appears well-developed and well-nourished. No distress.  HENT:  Head: Normocephalic and atraumatic.  Eyes: EOM are normal.  Neck: Neck supple.  Cardiovascular: Normal rate.  Pulmonary/Chest: Effort normal.  Musculoskeletal:       Right shoulder: He exhibits decreased range of motion, tenderness, pain and spasm. He exhibits no deformity, no laceration, normal pulse and normal strength.       Right elbow: He exhibits normal range of motion, no swelling, no deformity and no laceration. Tenderness found. Olecranon process tenderness noted.  Radial pulses 2+, adequate circulation.  Neurological: He is alert.  Skin: Skin is  warm and dry.  Psychiatric: He has a normal mood and affect.  Nursing note and vitals reviewed.    ED Treatments / Results  Labs (all labs ordered are listed, but only abnormal results are displayed) Labs Reviewed - No data to display  Radiology Dg Shoulder Right  Result Date: 09/18/2017 CLINICAL DATA:  False morning with right-sided pain, initial encounter EXAM: RIGHT SHOULDER - 2+ VIEW COMPARISON:  08/21/2007 FINDINGS: Mild degenerative changes of the acromioclavicular joint and glenohumeral articulation are noted. No acute fracture or dislocation is seen. The underlying bony thorax is within normal limits. IMPRESSION: Degenerative change without acute abnormality. Electronically Signed   By: Alcide CleverMark  Lukens M.D.    On: 09/18/2017 19:09   Dg Elbow Complete Right  Result Date: 09/18/2017 CLINICAL DATA:  67 year old male with fall and right elbow pain. EXAM: RIGHT ELBOW - COMPLETE 3+ VIEW COMPARISON:  None. FINDINGS: No acute fracture or dislocation. Mild arthritic changes. No joint effusion. The soft tissues appear unremarkable. A small bone fragment noted at the insertion of the triceps tendon on the olecranon process. The soft tissues appear unremarkable. IMPRESSION: Negative. Electronically Signed   By: Elgie CollardArash  Radparvar M.D.   On: 09/18/2017 19:12    Procedures Procedures (including critical care time) Dr. Criss AlvineGoldston in to see the patient.  Medications Ordered in ED Medications - No data to display   Initial Impression / Assessment and Plan / ED Course  I have reviewed the triage vital signs and the nursing notes. 67 y.o. male with right shoulder and right elbow pain s/p fall stable for d/c without fracture or dislocation noted on x-ray and no focal neuro deficits. Will treat for pain and inflammation. Sling given to patient prior to d/c with instructions on use and range of motion exercises for the shoulder. F/u with PCP for recheck. Return here for worsening symptoms.   Final Clinical Impressions(s) / ED Diagnoses   Final diagnoses:  Contusion of right shoulder, initial encounter  Fall, initial encounter  Contusion of right elbow, initial encounter    ED Discharge Orders        Ordered    diclofenac (VOLTAREN) 50 MG EC tablet  2 times daily     09/18/17 2010    cyclobenzaprine (FLEXERIL) 5 MG tablet  3 times daily PRN     09/18/17 [address]2010       Neese, Hope Minerva ParkM, [place]NP 09/18/17 2235    Pricilla LovelessGoldston, Scott, MD 09/18/17 [phone]

## 2017-09-26 DIAGNOSIS — M25511 Pain in right shoulder: Secondary | ICD-10-CM | POA: Diagnosis not present

## 2017-10-07 DIAGNOSIS — M25511 Pain in right shoulder: Secondary | ICD-10-CM | POA: Diagnosis not present

## 2017-10-18 ENCOUNTER — Ambulatory Visit (INDEPENDENT_AMBULATORY_CARE_PROVIDER_SITE_OTHER): Payer: Medicare Other | Admitting: Orthopaedic Surgery

## 2017-10-18 ENCOUNTER — Encounter (INDEPENDENT_AMBULATORY_CARE_PROVIDER_SITE_OTHER): Payer: Self-pay | Admitting: Orthopaedic Surgery

## 2017-10-18 DIAGNOSIS — M25511 Pain in right shoulder: Secondary | ICD-10-CM | POA: Insufficient documentation

## 2017-10-18 MED ORDER — METHYLPREDNISOLONE ACETATE 40 MG/ML IJ SUSP
40.0000 mg | INTRAMUSCULAR | Status: AC | PRN
  Administered 2017-10-18: 40 mg via INTRA_ARTICULAR

## 2017-10-18 MED ORDER — LIDOCAINE HCL 1 % IJ SOLN
3.0000 mL | INTRAMUSCULAR | Status: AC | PRN
  Administered 2017-10-18: 3 mL

## 2017-10-18 NOTE — Progress Notes (Signed)
Office Visit Note   Patient: Caleb [name]           Date of Birth: 1951/07/02           MRN: [id number] Visit Date: 10/18/2017              Requested by: Clovis RileyMitchell, L.August Saucerean, MD [address] [place], [place] [address] PCP: Clovis RileyMitchell, L.August Saucerean, MD   Assessment & Plan: Visit Diagnoses:  1. Acute pain of right shoulder     Plan: There is certainly a chance he did tear his rotator cuff based on his clinical exam.  Given the amount of pain he is having a lot of stress subacromial steroid injection.  I counseled him about the risk and benefits of injections and how this may affect his blood glucose.  I would like to see him back in just 2 weeks to see how he is done with this.  He still having weakness we would definitely get an MRI of his right shoulder.  All questions concerns were answered and addressed.  Follow-Up Instructions: Return in about 2 weeks (around 11/01/2017).   Orders:  Orders Placed This Encounter  Procedures  . Large Joint Inj   No orders of the defined types were placed in this encounter.     Procedures: Large Joint Inj: R subacromial bursa on 10/18/2017 9:19 AM Indications: pain and diagnostic evaluation Details: 22 G 1.5 in needle  Arthrogram: No  Medications: 3 mL lidocaine 1 %; 40 mg methylPREDNISolone acetate 40 MG/ML Outcome: tolerated well, no immediate complications Procedure, treatment alternatives, risks and benefits explained, specific risks discussed. Consent was given by the patient. Immediately prior to procedure a time out was called to verify the correct patient, procedure, equipment, support staff and site/side marked as required. Patient was prepped and draped in the usual sterile fashion.       Clinical Data: No additional findings.   Subjective: Chief Complaint  Patient presents with  . Right Shoulder - Follow-up  Patient is a very pleasant 67 year old gentleman who is right-hand dominant.  He is seeing us today with  significant right shoulder pain and weakness after a fall about a month ago.  He denies any previous injuries or problems with his right shoulder before.  He is a diabetic but under good control.  He denies any neck pain he denies any numbness and tingling in that arm.  He reports decreased mobility with his right shoulder and weakness and pain since then.  He says he cannot sleep at night well due to the pain.  HPI  Review of Systems He currently denies any headache, chest pain, shortness of breath, fever, chills, nausea, vomiting.  Objective: Vital Signs: There were no vitals taken for this visit.  Physical Exam He is alert and oriented x3 and in no acute distress Ortho Exam Examination of his right shoulder shows significant pain with abduction past 90 degrees.  He does do some of his deltoid to abduct his shoulder.  His liftoff is negative.  He has weakness on external rotation as well of the right shoulder.  His neck exam is normal and he has normal motor and sensory exam in his right hand. Specialty Comments:  No specialty comments available.  Imaging: No results found. X-rays on the canopy system independently reviewed of his right shoulder show well located shoulder with no significant arthritic changes at the glenohumeral joint.  The shoulder is well located.  PMFS History: Patient Active Problem List  Diagnosis Date Noted  . Acute pain of right shoulder 10/18/2017   Past Medical History:  Diagnosis Date  . High cholesterol   . Type II diabetes mellitus (HCC)     Family History  Problem Relation Age of Onset  . Cancer Mother   . Cancer Father   . Cancer Brother     Past Surgical History:  Procedure Laterality Date  . LEFT HEART CATHETERIZATION WITH CORONARY ANGIOGRAM N/A 05/04/2013   Procedure: LEFT HEART CATHETERIZATION WITH CORONARY ANGIOGRAM;  Surgeon: Pamella Pert, MD;  Location: [facility] CATH LAB;  Service: Cardiovascular;  Laterality: N/A;  . NO PAST SURGERIES      Social History   Occupational History  . Not on file  Tobacco Use  . Smoking status: Former Smoker    Packs/day: 0.12    Years: 20.00    Pack years: 2.40    Types: Cigarettes  . Smokeless tobacco: Never Used  . Tobacco comment: 05/04/2013 "quit smoking ~ 1991"  Substance and Sexual Activity  . Alcohol use: No  . Drug use: No  . Sexual activity: Never

## 2017-10-19 ENCOUNTER — Telehealth (INDEPENDENT_AMBULATORY_CARE_PROVIDER_SITE_OTHER): Payer: Self-pay | Admitting: Orthopaedic Surgery

## 2017-10-19 NOTE — Telephone Encounter (Signed)
He can take the diclofenac still as needed for pain and inflammation.

## 2017-10-19 NOTE — Telephone Encounter (Signed)
Patient called wanting to speak with you and ask about his medication (diclofenac). Received an injection yesterday and wanted to know if he should continue to take his diclofenac. CB # [phone]

## 2017-10-19 NOTE — Telephone Encounter (Signed)
Please advise 

## 2017-10-20 NOTE — Telephone Encounter (Signed)
Called patient to advise on message.  

## 2017-11-01 ENCOUNTER — Other Ambulatory Visit (INDEPENDENT_AMBULATORY_CARE_PROVIDER_SITE_OTHER): Payer: Self-pay

## 2017-11-01 ENCOUNTER — Encounter (INDEPENDENT_AMBULATORY_CARE_PROVIDER_SITE_OTHER): Payer: Self-pay | Admitting: Orthopaedic Surgery

## 2017-11-01 ENCOUNTER — Ambulatory Visit (INDEPENDENT_AMBULATORY_CARE_PROVIDER_SITE_OTHER): Payer: Medicare Other | Admitting: Orthopaedic Surgery

## 2017-11-01 DIAGNOSIS — M25511 Pain in right shoulder: Principal | ICD-10-CM

## 2017-11-01 DIAGNOSIS — G8929 Other chronic pain: Secondary | ICD-10-CM

## 2017-11-01 NOTE — Progress Notes (Signed)
   Office Visit Note   Patient: Caleb [name]           Date of Birth: 01-Feb-1951           MRN: [id number] Visit Date: 11/01/2017              Requested by: Clovis RileyMitchell, L.August Saucerean, MD [address] [place], [place] [address] PCP: Clovis RileyMitchell, L.August Saucerean, MD   Assessment & Plan: Visit Diagnoses:  1. Acute pain of right shoulder     Plan: Due to the fact he continues to have right shoulder pain despite time conservative treatment consisting medications subacromial injection recommend MRI of the right shoulder rule out rotator cuff tear.  Follow-up after the MRI to give her results to discuss further treatment.  He will resume his diclofenac that he was taken prior to the injection.  Follow-Up Instructions: Return for after MRI.   Orders:  No orders of the defined types were placed in this encounter.  No orders of the defined types were placed in this encounter.     Procedures: No procedures performed   Clinical Data: No additional findings.   Subjective: Chief Complaint  Patient presents with  . Right Shoulder - Follow-up    HPI [name] returns today follow-up of his right shoulder status post injection on 10/18/2017 subacromially by Dr. Magnus IvanBlackman.  He states the injection did help but he still having to adjust to a sleep sleeping with normal pillow.  His stiffness and pain anterior aspect the shoulder particularly in the morning. Review of Systems   Objective: Vital Signs: There were no vitals taken for this visit.  Physical Exam  Constitutional: He is oriented to person, place, and time. He appears well-developed and well-nourished. No distress.  Pulmonary/Chest: Effort normal.  Neurological: He is alert and oriented to person, place, and time.  Skin: He is not diaphoretic.  Psychiatric: He has a normal mood and affect.    Ortho Exam Bilateral shoulders he has 5 out of 5 strength with external and internal rotation against resistance.  Positive  impingement on the right.  Liftoff test negative on the left positive on the right.  Empty can test negative bilaterally Specialty Comments:  No specialty comments available.  Imaging: No results found.   PMFS History: Patient Active Problem List   Diagnosis Date Noted  . Acute pain of right shoulder 10/18/2017   Past Medical History:  Diagnosis Date  . High cholesterol   . Type II diabetes mellitus (HCC)     Family History  Problem Relation Age of Onset  . Cancer Mother   . Cancer Father   . Cancer Brother     Past Surgical History:  Procedure Laterality Date  . LEFT HEART CATHETERIZATION WITH CORONARY ANGIOGRAM N/A 05/04/2013   Procedure: LEFT HEART CATHETERIZATION WITH CORONARY ANGIOGRAM;  Surgeon: Pamella PertJagadeesh R Ganji, MD;  [facility] LAB;  Service: Cardiovascular;  Laterality: N/A;  . NO PAST SURGERIES     Social History   Occupational History  . Not on file  Tobacco Use  . Smoking status: Former Smoker    Packs/day: 0.12    Years: 20.00    Pack years: 2.40    Types: Cigarettes  . Smokeless tobacco: Never Used  . Tobacco comment: 05/04/2013 "quit smoking ~ 1991"  Substance and Sexual Activity  . Alcohol use: No  . Drug use: No  . Sexual activity: Never

## 2017-11-08 ENCOUNTER — Other Ambulatory Visit (INDEPENDENT_AMBULATORY_CARE_PROVIDER_SITE_OTHER): Payer: Self-pay | Admitting: Orthopaedic Surgery

## 2017-11-08 ENCOUNTER — Ambulatory Visit
Admission: RE | Admit: 2017-11-08 | Discharge: 2017-11-08 | Disposition: A | Discharge status: Home / Self Care | Payer: BC Managed Care – PPO | Source: Ambulatory Visit | Attending: Orthopaedic Surgery | Admitting: Orthopaedic Surgery

## 2017-11-08 DIAGNOSIS — G8929 Other chronic pain: Secondary | ICD-10-CM

## 2017-11-08 DIAGNOSIS — M25511 Pain in right shoulder: Principal | ICD-10-CM

## 2017-11-15 ENCOUNTER — Other Ambulatory Visit (INDEPENDENT_AMBULATORY_CARE_PROVIDER_SITE_OTHER): Payer: Self-pay

## 2017-11-15 ENCOUNTER — Ambulatory Visit (INDEPENDENT_AMBULATORY_CARE_PROVIDER_SITE_OTHER): Payer: BC Managed Care – PPO | Admitting: Orthopaedic Surgery

## 2017-11-15 ENCOUNTER — Encounter (INDEPENDENT_AMBULATORY_CARE_PROVIDER_SITE_OTHER): Payer: Self-pay | Admitting: Orthopaedic Surgery

## 2017-11-15 DIAGNOSIS — G8929 Other chronic pain: Secondary | ICD-10-CM | POA: Insufficient documentation

## 2017-11-15 DIAGNOSIS — M75121 Complete rotator cuff tear or rupture of right shoulder, not specified as traumatic: Secondary | ICD-10-CM

## 2017-11-15 DIAGNOSIS — M25511 Pain in right shoulder: Principal | ICD-10-CM

## 2017-11-15 MED ORDER — NABUMETONE 500 MG PO TABS: 500.0000 mg | ORAL_TABLET | Freq: Two times a day (BID) | ORAL | 1 refills | Status: DC | PRN

## 2017-11-15 NOTE — Progress Notes (Signed)
The patient is following up after an MRI of his right shoulder.  He has been having pain weakness in the right shoulder to 67 years old.  We have tried a steroid injection as well as rest and anti-inflammatories  On exam he can still abduct her shoulder but is very painful to do so.  He does have weakness in the right shoulder as well.  There is positive Neer and Hawkins signs.  The MRI does show a full-thickness tear description of his tendon.  There is also a downsloping acromion and AC joint arthritis.  There is significant tendinosis of the remainder of the rotator cuff.  This point I am recommending arthroscopic intervention but he wants to try to stay conservative which is okay as well.  I will send in Relafen as an anti-inflammatory and send him to physical therapy as an outpatient and hopefully can provide modalities will help increase his shoulder strength and function and decrease his pain.  We will see him back in 1 month and we can always consider steroid injection one more time in the subacromial space if needed.  All questions concerns were answered and addressed otherwise.

## 2017-11-25 ENCOUNTER — Ambulatory Visit: Payer: Medicare Other | Attending: Orthopaedic Surgery | Admitting: Physical Therapy

## 2017-11-25 DIAGNOSIS — M25611 Stiffness of right shoulder, not elsewhere classified: Secondary | ICD-10-CM

## 2017-11-25 DIAGNOSIS — M25511 Pain in right shoulder: Secondary | ICD-10-CM | POA: Diagnosis not present

## 2017-11-25 DIAGNOSIS — G8929 Other chronic pain: Secondary | ICD-10-CM | POA: Diagnosis not present

## 2017-11-25 DIAGNOSIS — M6281 Muscle weakness (generalized): Secondary | ICD-10-CM | POA: Insufficient documentation

## 2017-11-25 NOTE — Patient Instructions (Signed)
Access Code: [email or web address]  URL: [email or web address]  Date: 11/25/2017  Prepared by: Karie Mainland   Exercises  Seated Shoulder Flexion Towel Slide at Table Top - 10 reps - 1 sets - 15 hold - 2x daily - 7x weekly  Supine Shoulder Flexion with Dowel - 10 reps - 1 sets - 15 hold - 2x daily - 7x weekly  Seated Shoulder Scaption Slide at Table Top with Forearm in Neutral - 10 reps - 1 sets - 15 hold - 2x daily - 7x weekly  Seated Scapular Retraction - 10 reps - 2 sets - 15 hold - 2x daily - 7x weekly

## 2017-11-25 NOTE — Therapy (Signed)
[facility] [address] [place], [place], [address] Phone: [phone]   Fax:  [phone]  Physical Therapy Evaluation  Patient Details  Name: Caleb [name] MRN: [id number] Date of Birth: 03-11-1951 Referring Provider: Dr. Magnus Ivan    Encounter Date: 11/25/2017  PT End of Session - 11/25/17 1147    Visit Number  1    Number of Visits  16    Date for PT Re-Evaluation  01/20/18    PT Start Time  1104    PT Stop Time  1155    PT Time Calculation (min)  51 min    Activity Tolerance  Patient tolerated treatment well    Behavior During Therapy  [facility] for tasks assessed/performed       Past Medical History:  Diagnosis Date  . High cholesterol   . Type II diabetes mellitus (HCC)     Past Surgical History:  Procedure Laterality Date  . LEFT HEART CATHETERIZATION WITH CORONARY ANGIOGRAM N/A 05/04/2013   Procedure: LEFT HEART CATHETERIZATION WITH CORONARY ANGIOGRAM;  Surgeon: Pamella Pert, MD;  Location: [facility] CATH LAB;  Service: Cardiovascular;  Laterality: N/A;  . NO PAST SURGERIES      There were no vitals filed for this visit.   Subjective Assessment - 11/25/17 1107    Subjective  Pt fell 2nd week of March fell directly on his Rt side.  He has had pain ever since. He has pain in his Rt shoulder unrelated to activity.  Sometimes has pain in Rt lower arm.  He denies sensory changes, weakness.  He can do his ADLs but avoid driving with Rt hand, lifting, reaching back.  Pain is overall improving but has had issues with meds, had cortisone injection which helped a bit.      Pertinent History  diabetes     Limitations  Lifting;House hold activities;Other (comment)    Diagnostic tests  MRI Severe rotator cuff tendinopathy/tendinosis , full-thickness retracted tear involving the supraspinatus tendon, Moderate AC joint degenerative changes    Patient Stated Goals  Pain relief, avoid surgery     Currently in Pain?  Yes    Pain  Score  8     Pain Location  Shoulder    Pain Orientation  Right;Anterior;Upper    Pain Descriptors / Indicators  Tightness;Stabbing hurts     Pain Type  Chronic pain    Pain Radiating Towards  lower arm and hand     Pain Onset  More than a month ago    Pain Frequency  Intermittent    Aggravating Factors   using R arm     Pain Relieving Factors  meds, ice , changing positions , massage          [facility] PT Assessment - 11/25/17 0001      Assessment   Medical Diagnosis  chronic Rt shoulder pain     Referring Provider  Dr. Magnus Ivan     Onset Date/Surgical Date  09/20/17    Hand Dominance  Right    Prior Therapy  no       Precautions   Precautions  None      Restrictions   Weight Bearing Restrictions  No      Balance Screen   Has the patient fallen in the past 6 months  Yes    How many times?  1 , slipped on wet floor     Has the patient had a decrease in activity level because of a fear  of falling?   No    Is the patient reluctant to leave their home because of a fear of falling?   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     Leisure  Patient       Cognition   Overall Cognitive Status  Within Functional Limits for tasks assessed      Observation/Other Assessments   Focus on Therapeutic Outcomes (FOTO)   47%      Sensation   Light Touch  Appears Intact      Coordination   Gross Motor Movements are Fluid and Coordinated  Not tested      Posture/Postural Control   Posture/Postural Control  Postural limitations    Postural Limitations  Rounded Shoulders;Forward head      AROM   Right Shoulder Flexion  126 Degrees    Right Shoulder ABduction  95 Degrees    Right Shoulder Internal Rotation  -- FR to sacrum     Right Shoulder External  Rotation  -- FR to C7      PROM   Overall PROM Comments  pain all planes: flexion 130 deg, abd 100 deg, ER 70, IR 70      Strength   Right Shoulder Flexion  4-/5 pain     Right Shoulder ABduction  3-/5 pain    Right Shoulder Internal Rotation  4+/5    Right Shoulder External Rotation  4/5 pain    Left Shoulder Flexion  5/5    Left Shoulder ABduction  5/5    Right Elbow Flexion  4+/5    Left Elbow Flexion  5/5      Palpation   Palpation comment  tender, painful anterior shoulder and superiorly along supraspinatus                 Objective measurements completed on examination: See above findings.      [facility] Adult PT Treatment/Exercise - 11/25/17 0001      Self-Care   Self-Care  ADL's;Posture;Heat/Ice Application;Other Self-Care Comments    ADL's  avoid using arm if pain sharp     Posture  use pilllow for support     Heat/Ice Application  ice for inflammation, pain     Other Self-Care Comments   HEP      Shoulder Exercises: Supine   Flexion  AAROM;Both;5 reps      Shoulder Exercises: Seated   Flexion  AAROM;Right;10 reps    Abduction  AAROM;Right;5 reps             PT Education - 11/25/17 1147    Education provided  Yes    Education Details  PT/POC, HEP     Person(s) Educated  Patient    Methods  Explanation;Demonstration;Tactile cues;Verbal cues;Handout    Comprehension  Verbalized understanding;Returned demonstration       PT Short Term Goals - 11/25/17 1226      PT SHORT TERM GOAL #1   Title  Pt will be I with HEP for Rt UE ROM and strength     Time  4    Period  Weeks    Status  New    Target Date  12/23/17  PT SHORT TERM GOAL #2   Title  Pt will be able to get dressed, complete ADLs/hygiene, with 25% less pain    Time  4    Period  Weeks    Status  New    Target Date  12/23/17      PT SHORT TERM GOAL #3   Title  Pt will be able to understand concepts of RICE and use for self care at home.     Time  4    Period  Weeks     Status  New    Target Date  12/23/17        PT Long Term Goals - 11/25/17 1153      PT LONG TERM GOAL #1   Title  Pt will score <40% impaired on FOTO to demo improved functional mobility.     Time  8    Period  Weeks    Status  New    Target Date  01/20/18      PT LONG TERM GOAL #2   Title  Pt will be able to raise R arm overhead with pain minimal (120 deg ore more) for home tasks, washing the car     Time  8    Period  Weeks    Status  New    Target Date  01/20/18      PT LONG TERM GOAL #3   Title  Pt will be I with HEP upon discharge for Rt UE and posture    Time  8    Period  Weeks    Status  New    Target Date  01/20/18      PT LONG TERM GOAL #4   Title  Pt will be demo 4+/5 strength in Rt UE in all planes to return to part time work, volunteering.     Time  8    Period  Weeks    Status  New    Target Date  01/20/18      PT LONG TERM GOAL #5   Title  Pt will be able to chop, lift saucepan in the kitchen using Rt UE without pain.     Time  8    Period  Weeks    Status  New    Target Date  01/20/18             Plan - 11/25/17 1234    Clinical Impression Statement  Patient presents for low complexity eval of Rt UE pain which is due to significant shoulder pathology sustained with a fall.  He does have a full thickness supraspinatus tear and tendinopathy throughout rotator cuff which limits his strength, ROM and ability to use his dominant arm at home and with part time work.      Clinical Presentation  Stable    Clinical Decision Making  Low    Rehab Potential  Good    PT Frequency  2x / week    PT Duration  8 weeks    PT Treatment/Interventions  ADLs/Self Care Home Management;Electrical Stimulation;Iontophoresis /ml Dexamethasone;Moist Heat;Cryotherapy;Ultrasound;Patient/family education;Therapeutic exercise;Therapeutic activities;Functional mobility training;Neuromuscular re-education;Taping;Passive range of motion    PT Next Visit Plan  AAROM as  tolerated, manual, modalities for pain     PT Home Exercise Plan  table slides and scapular retraction     Consulted and Agree with Plan of Care  Patient       Patient will benefit from skilled therapeutic intervention in order to improve  the following deficits and impairments:  Decreased mobility, Hypomobility, Increased muscle spasms, Pain, Postural dysfunction, Impaired UE functional use, Impaired flexibility, Increased fascial restricitons, Decreased strength, Decreased range of motion  Visit Diagnosis: Chronic right shoulder pain  Muscle weakness (generalized)  Stiffness of right shoulder, not elsewhere classified     Problem List Patient Active Problem List   Diagnosis Date Noted  . Nontraumatic complete tear of right rotator cuff 11/15/2017  . Chronic right shoulder pain 11/15/2017  . Acute pain of right shoulder 10/18/2017    PAA,JENNIFER 11/25/2017, 12:39 PM  [facility] [address] [place], [place], [address] Phone: [phone]   Fax:  [phone]  Name: Caleb [name] MRN: [id number] Date of Birth: 09-Feb-1951  Karie Mainland, PT 11/25/17 12:40 PM Phone: [phone] Fax: [phone]

## 2017-11-28 ENCOUNTER — Ambulatory Visit: Payer: Medicare Other | Admitting: Physical Therapy

## 2017-11-28 DIAGNOSIS — M6281 Muscle weakness (generalized): Secondary | ICD-10-CM | POA: Diagnosis not present

## 2017-11-28 DIAGNOSIS — M25611 Stiffness of right shoulder, not elsewhere classified: Secondary | ICD-10-CM

## 2017-11-28 DIAGNOSIS — M25511 Pain in right shoulder: Principal | ICD-10-CM

## 2017-11-28 DIAGNOSIS — G8929 Other chronic pain: Secondary | ICD-10-CM

## 2017-11-28 NOTE — Therapy (Signed)
[facility] Outpatient Rehabilitation [facility] [address] [place], [place], [address] Phone: [phone]   Fax:  [phone]  Physical Therapy Treatment  Patient Details  Name: Caleb [name] MRN: [id number] Date of Birth: 1951/02/01 Referring Provider: Dr. Magnus Ivan    Encounter Date: 11/28/2017  PT End of Session - 11/28/17 0923    Visit Number  2    Number of Visits  16    Date for PT Re-Evaluation  01/20/18    PT Start Time  0928    PT Stop Time  1011    PT Time Calculation (min)  43 min    Activity Tolerance  Patient tolerated treatment well    Behavior During Therapy  [facility] for tasks assessed/performed       Past Medical History:  Diagnosis Date  . High cholesterol   . Type II diabetes mellitus (HCC)     Past Surgical History:  Procedure Laterality Date  . LEFT HEART CATHETERIZATION WITH CORONARY ANGIOGRAM N/A 05/04/2013   Procedure: LEFT HEART CATHETERIZATION WITH CORONARY ANGIOGRAM;  Surgeon: Pamella Pert, MD;  Location: [facility] CATH LAB;  Service: Cardiovascular;  Laterality: N/A;  . NO PAST SURGERIES      There were no vitals filed for this visit.  Subjective Assessment - 11/28/17 0929    Subjective  I think i did something I shouldn't have done.  I had to help out at church yesterday.      Currently in Pain?  Yes    Pain Score  9     Pain Location  Shoulder    Pain Orientation  Right    Pain Descriptors / Indicators  Burning    Pain Type  Chronic pain    Pain Onset  More than a month ago    Pain Frequency  Intermittent    Aggravating Factors   using arm     Pain Relieving Factors  meds, ice , rest             [facility] Adult PT Treatment/Exercise - 11/28/17 0001      Shoulder Exercises: Supine   External Rotation  AROM;AAROM;Right;10 reps    External Rotation Weight (lbs)  cane     Flexion  AAROM;Both;10 reps    Shoulder Flexion Weight (lbs)  UE ranger, chest press then overhead x 10 to tolerance       Shoulder Exercises:  Standing   External Rotation  Strengthening;Right;10 reps    Theraband Level (Shoulder External Rotation)  Level 2 (Red)    Extension  Strengthening;Both;15 reps    Theraband Level (Shoulder Extension)  Level 2 (Red)    Row  Strengthening;Both;15 reps    Theraband Level (Shoulder Row)  Level 2 (Red)      Shoulder Exercises: Pulleys   Flexion  2 minutes    Scaption  2 minutes    Scaption Limitations  too painful       Shoulder Exercises: Stretch   Table Stretch - Abduction  --    Table Stretch - ABduction Limitations  --    Table Stretch - External Rotation  --    Table Stretch - External Rotation Limitations  --      Ultrasound   Ultrasound Location  Rt ant shoulder biofreeze     Ultrasound Parameters  50%, , 1.2 W/cm2     Ultrasound Goals  Pain      Manual Therapy   Manual Therapy  Soft tissue mobilization;Passive ROM    Passive ROM  flexion,  abduction, ER and IR to tolerance              PT Education - 11/28/17 1014    Education provided  Yes    Education Details  HEP, ultrasound , biofreeze     Person(s) Educated  Patient    Methods  Explanation    Comprehension  Verbalized understanding;Returned demonstration       PT Short Term Goals - 11/25/17 1226      PT SHORT TERM GOAL #1   Title  Pt will be I with HEP for Rt UE ROM and strength     Time  4    Period  Weeks    Status  New    Target Date  12/23/17      PT SHORT TERM GOAL #2   Title  Pt will be able to get dressed, complete ADLs/hygiene, with 25% less pain    Time  4    Period  Weeks    Status  New    Target Date  12/23/17      PT SHORT TERM GOAL #3   Title  Pt will be able to understand concepts of RICE and use for self care at home.     Time  4    Period  Weeks    Status  New    Target Date  12/23/17        PT Long Term Goals - 11/25/17 1153      PT LONG TERM GOAL #1   Title  Pt will score <40% impaired on FOTO to demo improved functional mobility.     Time  8    Period  Weeks     Status  New    Target Date  01/20/18      PT LONG TERM GOAL #2   Title  Pt will be able to raise R arm overhead with pain minimal (120 deg ore more) for home tasks, washing the car     Time  8    Period  Weeks    Status  New    Target Date  01/20/18      PT LONG TERM GOAL #3   Title  Pt will be I with HEP upon discharge for Rt UE and posture    Time  8    Period  Weeks    Status  New    Target Date  01/20/18      PT LONG TERM GOAL #4   Title  Pt will be demo 4+/5 strength in Rt UE in all planes to return to part time work, volunteering.     Time  8    Period  Weeks    Status  New    Target Date  01/20/18      PT LONG TERM GOAL #5   Title  Pt will be able to chop, lift saucepan in the kitchen using Rt UE without pain.     Time  8    Period  Weeks    Status  New    Target Date  01/20/18            Plan - 11/28/17 0924    Clinical Impression Statement  Pain high today although lessened with AAROM and ultrasound.  Cont with POC.  Asked about wearing a sling for pain control, gave him the option to do and also to avoid habitual use of Rt UE.      PT Treatment/Interventions  ADLs/Self  Care Home Management;Electrical Stimulation;Iontophoresis /ml Dexamethasone;Moist Heat;Cryotherapy;Ultrasound;Patient/family education;Therapeutic exercise;Therapeutic activities;Functional mobility training;Neuromuscular re-education;Taping;Passive range of motion    PT Next Visit Plan Try tape to R shoulder, repeat US if favorable.  AAROM as tolerated, manual, modalities for pain     PT Home Exercise Plan  table slides and scapular retraction     Consulted and Agree with Plan of Care  Patient       Patient will benefit from skilled therapeutic intervention in order to improve the following deficits and impairments:  Decreased mobility, Hypomobility, Increased muscle spasms, Pain, Postural dysfunction, Impaired UE functional use, Impaired flexibility, Increased fascial restricitons,  Decreased strength, Decreased range of motion  Visit Diagnosis: Chronic right shoulder pain  Muscle weakness (generalized)  Stiffness of right shoulder, not elsewhere classified     Problem List Patient Active Problem List   Diagnosis Date Noted  . Nontraumatic complete tear of right rotator cuff 11/15/2017  . Chronic right shoulder pain 11/15/2017  . Acute pain of right shoulder 10/18/2017    Malasha Kleppe 11/28/2017, 10:29 AM  [facility] [address] [place], [place], [address] Phone: [phone]   Fax:  [phone]  Name: Caleb [name] MRN: [id number] Date of Birth: 1950/08/22   Karie Mainland, PT 11/28/17 10:29 AM Phone: [phone] Fax: [phone]

## 2017-11-30 ENCOUNTER — Ambulatory Visit: Payer: Medicare Other | Admitting: Physical Therapy

## 2017-11-30 ENCOUNTER — Encounter: Payer: Self-pay | Admitting: Physical Therapy

## 2017-11-30 DIAGNOSIS — M6281 Muscle weakness (generalized): Secondary | ICD-10-CM | POA: Diagnosis not present

## 2017-11-30 DIAGNOSIS — M25611 Stiffness of right shoulder, not elsewhere classified: Secondary | ICD-10-CM | POA: Diagnosis not present

## 2017-11-30 DIAGNOSIS — M25511 Pain in right shoulder: Principal | ICD-10-CM

## 2017-11-30 DIAGNOSIS — G8929 Other chronic pain: Secondary | ICD-10-CM

## 2017-11-30 NOTE — Therapy (Signed)
[facility] Outpatient Rehabilitation [facility] [address] [place], [place], [address] Phone: [phone]   Fax:  [phone]  Physical Therapy Treatment  Patient Details  Name: Caleb [name] MRN: [id number] Date of Birth: 05-26-1951 Referring Provider: Dr. Magnus Ivan    Encounter Date: 11/30/2017  PT End of Session - 11/30/17 1259    Visit Number  3    Number of Visits  16    Date for PT Re-Evaluation  01/20/18    PT Start Time  0935    PT Stop Time  1015    PT Time Calculation (min)  40 min    Activity Tolerance  Patient tolerated treatment well    Behavior During Therapy  [facility] for tasks assessed/performed       Past Medical History:  Diagnosis Date  . High cholesterol   . Type II diabetes mellitus (HCC)     Past Surgical History:  Procedure Laterality Date  . LEFT HEART CATHETERIZATION WITH CORONARY ANGIOGRAM N/A 05/04/2013   Procedure: LEFT HEART CATHETERIZATION WITH CORONARY ANGIOGRAM;  Surgeon: Pamella Pert, MD;  Location: [facility] CATH LAB;  Service: Cardiovascular;  Laterality: N/A;  . NO PAST SURGERIES      There were no vitals filed for this visit.  Subjective Assessment - 11/30/17 0937    Subjective  No pain this morning until he took his medicatrion. Sometimes I feel like I want to it to pop.  shoulder locks.  PT helps with pain.     Currently in Pain?  Yes    Pain Score  5     Pain Location  Shoulder    Pain Orientation  Right;Anterior;Posterior    Pain Descriptors / Indicators  Burning    Pain Type  Chronic pain    Pain Radiating Towards  down arm to anterior wrist  deltoid worst lateral    Pain Frequency  Intermittent    Aggravating Factors   shower,,  reaching behind    Pain Relieving Factors  ice rest    Multiple Pain Sites  -- had pain in botrh knees last night,  hasnt had in awhile  not sure why flared.  5/10         [facility] PT Assessment - 11/30/17 0001      AROM   Right Shoulder Flexion  123 Degrees                    [facility] Adult PT Treatment/Exercise - 11/30/17 0001      Shoulder Exercises: Seated   Retraction  10 reps      Ultrasound   Ultrasound Location  Rt Shoulder    Ultrasound Parameters  50%, 1 mHz, 1.5 watts/cm2    Ultrasound Goals  Pain 8 minutes      Manual Therapy   Manual Therapy  Soft tissue mobilization;Passive ROM;Taping    Soft tissue mobilization  tissue softened    Passive ROM  flexion, abduction, ER and IR to tolerance     Kinesiotex  Inhibit Muscle;Ligament Correction      Kinesiotix   Inhibit Muscle   deltoid, supraspinatus 8/10 pain getting into position for application.    Ligament Correction  posture             PT Education - 11/30/17 1259    Education provided  Yes    Education Details  How [place] and tape work    [employer]) Educated  Patient    Methods  Explanation    Comprehension  Verbalized understanding       PT Short Term Goals - 11/25/17 1226      PT SHORT TERM GOAL #1   Title  Pt will be I with HEP for Rt UE ROM and strength     Time  4    Period  Weeks    Status  New    Target Date  12/23/17      PT SHORT TERM GOAL #2   Title  Pt will be able to get dressed, complete ADLs/hygiene, with 25% less pain    Time  4    Period  Weeks    Status  New    Target Date  12/23/17      PT SHORT TERM GOAL #3   Title  Pt will be able to understand concepts of RICE and use for self care at home.     Time  4    Period  Weeks    Status  New    Target Date  12/23/17        PT Long Term Goals - 11/25/17 1153      PT LONG TERM GOAL #1   Title  Pt will score <40% impaired on FOTO to demo improved functional mobility.     Time  8    Period  Weeks    Status  New    Target Date  01/20/18      PT LONG TERM GOAL #2   Title  Pt will be able to raise R arm overhead with pain minimal (120 deg ore more) for home tasks, washing the car     Time  8    Period  Weeks    Status  New    Target Date  01/20/18      PT LONG TERM GOAL  #3   Title  Pt will be I with HEP upon discharge for Rt UE and posture    Time  8    Period  Weeks    Status  New    Target Date  01/20/18      PT LONG TERM GOAL #4   Title  Pt will be demo 4+/5 strength in Rt UE in all planes to return to part time work, volunteering.     Time  8    Period  Weeks    Status  New    Target Date  01/20/18      PT LONG TERM GOAL #5   Title  Pt will be able to chop, lift saucepan in the kitchen using Rt UE without pain.     Time  8    Period  Weeks    Status  New    Target Date  01/20/18            Plan - 11/30/17 1300    Clinical Impression Statement  PT seems to be helping the pain.  Focus today on pain control.  Pain was high with the application of tape and after application he had no pain at rest  .  123  AROM measured prior to tape.  After tape Flexion estimated to be at least 145.    PT Next Visit Plan  AAROM as tolerated, manual, modalities for pain Assess tape,  consider IFC for pain control    PT Home Exercise Plan  table slides and scapular retraction     Consulted and Agree with Plan of Care  Patient       Patient  will benefit from skilled therapeutic intervention in order to improve the following deficits and impairments:     Visit Diagnosis: Chronic right shoulder pain  Muscle weakness (generalized)  Stiffness of right shoulder, not elsewhere classified     Problem List Patient Active Problem List   Diagnosis Date Noted  . Nontraumatic complete tear of right rotator cuff 11/15/2017  . Chronic right shoulder pain 11/15/2017  . Acute pain of right shoulder 10/18/2017    Demita Tobia  PTA 11/30/2017, 1:05 PM  [facility] [address] [place], [place], [address] Phone: [phone]   Fax:  [phone]  Name: Hakeen Shipes MRN: [id number] Date of Birth: 1950/09/17

## 2017-11-30 NOTE — Patient Instructions (Signed)
Remove tape if irritating 

## 2017-12-06 ENCOUNTER — Encounter: Payer: Self-pay | Admitting: Physical Therapy

## 2017-12-06 ENCOUNTER — Ambulatory Visit: Payer: Medicare Other | Admitting: Physical Therapy

## 2017-12-06 DIAGNOSIS — M25511 Pain in right shoulder: Principal | ICD-10-CM

## 2017-12-06 DIAGNOSIS — G8929 Other chronic pain: Secondary | ICD-10-CM

## 2017-12-06 DIAGNOSIS — M25611 Stiffness of right shoulder, not elsewhere classified: Secondary | ICD-10-CM

## 2017-12-06 DIAGNOSIS — M6281 Muscle weakness (generalized): Secondary | ICD-10-CM | POA: Diagnosis not present

## 2017-12-06 NOTE — Therapy (Signed)
[facility] Outpatient Rehabilitation [facility] [address] [place], [place], [address] Phone: [phone]   Fax:  [phone]  Physical Therapy Treatment  Patient Details  Name: Caleb [name] MRN: [id number] Date of Birth: 06-22-1951 Referring Provider: Dr. Magnus Ivan    Encounter Date: 12/06/2017  PT End of Session - 12/06/17 1109    Visit Number  4    Number of Visits  16    Date for PT Re-Evaluation  01/20/18    PT Start Time  1018    PT Stop Time  1115    PT Time Calculation (min)  57 min    Activity Tolerance  Patient tolerated treatment well    Behavior During Therapy  [facility] for tasks assessed/performed       Past Medical History:  Diagnosis Date  . High cholesterol   . Type II diabetes mellitus (HCC)     Past Surgical History:  Procedure Laterality Date  . LEFT HEART CATHETERIZATION WITH CORONARY ANGIOGRAM N/A 05/04/2013   Procedure: LEFT HEART CATHETERIZATION WITH CORONARY ANGIOGRAM;  Surgeon: Pamella Pert, MD;  Location: [facility] CATH LAB;  Service: Cardiovascular;  Laterality: N/A;  . NO PAST SURGERIES      There were no vitals filed for this visit.  Subjective Assessment - 12/06/17 1024    Subjective  Tape lasted 5 days then i took it off.  i could tell a difference.  He is doing the exercises,  Less posterior burning.    Currently in Pain?  Yes    Pain Score  5     Pain Location  Shoulder    Pain Orientation  Right;Anterior;Posterior    Pain Descriptors / Indicators  Burning;Discomfort    Pain Frequency  Intermittent    Aggravating Factors   moving  night pain pos    Pain Relieving Factors  sitting  rest                       [facility] Adult PT Treatment/Exercise - 12/06/17 0001      Shoulder Exercises: Supine   Other Supine Exercises  retraction 10 x      Shoulder Exercises: Isometric Strengthening   Flexion  5X5"    Extension  5X5"    External Rotation  5X5"    Internal Rotation  5X5"      Cryotherapy   Number  Minutes Cryotherapy  10 Minutes    Cryotherapy Location  Shoulder    Type of Cryotherapy  -- cold pack      Ultrasound   Ultrasound Location  Rt shoulder    Ultrasound Parameters  50%  1 mHz ,  1.5 watts/cm2,  8 minutes    Ultrasound Goals  Pain 8 minutes      Kinesiotix   Inhibit Muscle   deltoid, supraspinatus 8/10 pain getting into position for application.    Ligament Correction  posture             PT Education - 12/06/17 1040    Education Details  HEP    Methods  Explanation;Handout;Verbal cues    Comprehension  Verbalized understanding;Returned demonstration       PT Short Term Goals - 11/25/17 1226      PT SHORT TERM GOAL #1   Title  Pt will be I with HEP for Rt UE ROM and strength     Time  4    Period  Weeks    Status  New    Target Date  12/23/17      PT SHORT TERM GOAL #2   Title  Pt will be able to get dressed, complete ADLs/hygiene, with 25% less pain    Time  4    Period  Weeks    Status  New    Target Date  12/23/17      PT SHORT TERM GOAL #3   Title  Pt will be able to understand concepts of RICE and use for self care at home.     Time  4    Period  Weeks    Status  New    Target Date  12/23/17        PT Long Term Goals - 11/25/17 1153      PT LONG TERM GOAL #1   Title  Pt will score <40% impaired on FOTO to demo improved functional mobility.     Time  8    Period  Weeks    Status  New    Target Date  01/20/18      PT LONG TERM GOAL #2   Title  Pt will be able to raise R arm overhead with pain minimal (120 deg ore more) for home tasks, washing the car     Time  8    Period  Weeks    Status  New    Target Date  01/20/18      PT LONG TERM GOAL #3   Title  Pt will be I with HEP upon discharge for Rt UE and posture    Time  8    Period  Weeks    Status  New    Target Date  01/20/18      PT LONG TERM GOAL #4   Title  Pt will be demo 4+/5 strength in Rt UE in all planes to return to part time work, volunteering.     Time  8     Period  Weeks    Status  New    Target Date  01/20/18      PT LONG TERM GOAL #5   Title  Pt will be able to chop, lift saucepan in the kitchen using Rt UE without pain.     Time  8    Period  Weeks    Status  New    Target Date  01/20/18            Plan - 12/06/17 1314    Clinical Impression Statement  Patient now able to reach behing body with less pain.  He was able to progress to gentle isometrics.  He will be gone about 10 days for vacation  starting tomorrow.    PT Next Visit Plan  AAROM as tolerated, manual, modalities for pain,  tape,  review isometrics    PT Home Exercise Plan  table slides and scapular retraction Isometrics       Patient will benefit from skilled therapeutic intervention in order to improve the following deficits and impairments:     Visit Diagnosis: Chronic right shoulder pain  Muscle weakness (generalized)  Stiffness of right shoulder, not elsewhere classified     Problem List Patient Active Problem List   Diagnosis Date Noted  . Nontraumatic complete tear of right rotator cuff 11/15/2017  . Chronic right shoulder pain 11/15/2017  . Acute pain of right shoulder 10/18/2017    Jason Hauge PTA 12/06/2017, 1:19 PM  [facility] [address] [place], [place], [address] Phone:  [phone]   Fax:  [phone]  Name: Caleb [name] MRN: [id number] Date of Birth: 1950/11/13

## 2017-12-06 NOTE — Patient Instructions (Signed)
Strengthening: Isometric Flexion  Using wall for resistance, press right fist into ball using light pressure. Hold ___5 - 10_ seconds. Repeat __10  - 5 times per set. Do __1__ sets per session. Do _2___ sessions per day.  SHOULDER: Abduction (Isometric)  Use wall as resistance. Press arm against pillow. Keep elbow straight. Hold _5__ seconds. 5-10___ reps per set, _2__ sets per day.  Extension (Isometric)  Place left bent elbow and back of arm against wall. Press elbow against wall. Hold __5-10__ seconds. Repeat __5-10__ times. Do _2___ sessions per day.  Internal Rotation (Isometric)  Place palm of right fist against door frame, with elbow bent. Press fist against door frame. Hold _5- 10___ seconds. Repeat _5-10___ times. Do _2___ sessions per day.  External Rotation (Isometric)  Place back of left fist against door frame, with elbow bent. Press fist against door frame. Hold ____ seconds. Repeat __5-10__ times. Do __2__ sessions per day.  Copyright  VHI. All rights reserved.   PAINFREE

## 2017-12-12 ENCOUNTER — Encounter: Payer: Self-pay | Admitting: Physical Therapy

## 2017-12-12 ENCOUNTER — Ambulatory Visit (INDEPENDENT_AMBULATORY_CARE_PROVIDER_SITE_OTHER): Payer: BC Managed Care – PPO | Admitting: Orthopaedic Surgery

## 2017-12-12 ENCOUNTER — Encounter (INDEPENDENT_AMBULATORY_CARE_PROVIDER_SITE_OTHER): Payer: Self-pay | Admitting: Orthopaedic Surgery

## 2017-12-12 ENCOUNTER — Ambulatory Visit: Payer: Medicare Other | Attending: Orthopaedic Surgery | Admitting: Physical Therapy

## 2017-12-12 DIAGNOSIS — M25511 Pain in right shoulder: Secondary | ICD-10-CM

## 2017-12-12 DIAGNOSIS — M25611 Stiffness of right shoulder, not elsewhere classified: Secondary | ICD-10-CM | POA: Diagnosis not present

## 2017-12-12 DIAGNOSIS — M6281 Muscle weakness (generalized): Secondary | ICD-10-CM

## 2017-12-12 DIAGNOSIS — G8929 Other chronic pain: Secondary | ICD-10-CM | POA: Diagnosis not present

## 2017-12-12 MED ORDER — METHYLPREDNISOLONE ACETATE 40 MG/ML IJ SUSP
40.0000 mg | INTRAMUSCULAR | Status: AC | PRN
  Administered 2017-12-12: 40 mg via INTRA_ARTICULAR

## 2017-12-12 MED ORDER — ACETAMINOPHEN-CODEINE #3 300-30 MG PO TABS: 1.0000 | ORAL_TABLET | Freq: Three times a day (TID) | ORAL | 0 refills | Status: DC | PRN

## 2017-12-12 MED ORDER — LIDOCAINE HCL 1 % IJ SOLN
3.0000 mL | INTRAMUSCULAR | Status: AC | PRN
  Administered 2017-12-12: 3 mL

## 2017-12-12 NOTE — Therapy (Signed)
[facility] [address] [place], [place], [address] Phone: [phone]   Fax:  [phone]  Physical Therapy Treatment  Patient Details  Name: Caleb [name] MRN: [id number] Date of Birth: 1951-06-30 Referring Provider: Dr. Magnus IvanBlackman    Encounter Date: 12/12/2017  PT End of Session - 12/12/17 1320    Visit Number  5    Number of Visits  16    Date for PT Re-Evaluation  01/20/18    PT Start Time  1104    PT Stop Time  1145    PT Time Calculation (min)  41 min    Activity Tolerance  Patient tolerated treatment well    Behavior During Therapy  [facility] for tasks assessed/performed       Past Medical History:  Diagnosis Date  . High cholesterol   . Type II diabetes mellitus (HCC)     Past Surgical History:  Procedure Laterality Date  . LEFT HEART CATHETERIZATION WITH CORONARY ANGIOGRAM N/A 05/04/2013   Procedure: LEFT HEART CATHETERIZATION WITH CORONARY ANGIOGRAM;  Surgeon: Pamella PertJagadeesh R Ganji, MD;  [facility];  Service: Cardiovascular;  Laterality: N/A;  . NO PAST SURGERIES      There were no vitals filed for this visit.  Subjective Assessment - 12/12/17 1109    Subjective  Saw MD today.  New Med tylenol #3,  Had a new injection.  he see's Md again in 1 month.  had pain the entire  vacation,  not sleeping well. Able to shower without problems.     Currently in Pain?  Yes    Pain Score  7     Pain Location  Shoulder    Pain Orientation  Right;Anterior;Posterior    Pain Descriptors / Indicators  Sore;Aching    Pain Radiating Towards  down arm to wrist    Pain Frequency  Constant    Aggravating Factors   shooting down arm with sitting,  abduction    Pain Relieving Factors  tape    Effect of Pain on Daily Activities  limits enjoyment     Multiple Pain Sites  No                       [facility] Adult PT Treatment/Exercise - 12/12/17 0001      Self-Care   Other Self-Care Comments   Avoid shoulder  abduction,  work on posture, etc.       Shoulder Exercises: Isometric Strengthening   Flexion  5X5"    Extension  5X5"    External Rotation  5X5"    Internal Rotation  5X5"      Ultrasound   Ultrasound Location  Right shoulder anterior    Ultrasound Parameters  50%,  1 MHz    Ultrasound Goals  Pain 8 minutes      Manual Therapy   Manual Therapy  Soft tissue mobilization;Passive ROM;Taping    Soft tissue mobilization  tissue softened    Kinesiotex  Inhibit Muscle;Ligament Correction      Kinesiotix   Inhibit Muscle   deltiod,  Avoided area of injection             PT Education - 12/12/17 1320    Education provided  No       PT Short Term Goals - 11/25/17 1226      PT SHORT TERM GOAL #1   Title  Pt will be I with HEP for Rt UE ROM and strength  Time  4    Period  Weeks    Status  New    Target Date  12/23/17      PT SHORT TERM GOAL #2   Title  Pt will be able to get dressed, complete ADLs/hygiene, with 25% less pain    Time  4    Period  Weeks    Status  New    Target Date  12/23/17      PT SHORT TERM GOAL #3   Title  Pt will be able to understand concepts of RICE and use for self care at home.     Time  4    Period  Weeks    Status  New    Target Date  12/23/17        PT Long Term Goals - 11/25/17 1153      PT LONG TERM GOAL #1   Title  Pt will score <40% impaired on FOTO to demo improved functional mobility.     Time  8    Period  Weeks    Status  New    Target Date  01/20/18      PT LONG TERM GOAL #2   Title  Pt will be able to raise R arm overhead with pain minimal (120 deg ore more) for home tasks, washing the car     Time  8    Period  Weeks    Status  New    Target Date  01/20/18      PT LONG TERM GOAL #3   Title  Pt will be I with HEP upon discharge for Rt UE and posture    Time  8    Period  Weeks    Status  New    Target Date  01/20/18      PT LONG TERM GOAL #4   Title  Pt will be demo 4+/5 strength in Rt UE in all planes  to return to part time work, volunteering.     Time  8    Period  Weeks    Status  New    Target Date  01/20/18      PT LONG TERM GOAL #5   Title  Pt will be able to chop, lift saucepan in the kitchen using Rt UE without pain.     Time  8    Period  Weeks    Status  New    Target Date  01/20/18            Plan - 12/12/17 1320    Clinical Impression Statement  Patient see's MD again in 1 month,  if not better surgery is the plan.  Pain is now constant and nothing seems to make oit worse.  He continues to work with HEP however he does not like to do due to being painful.  (Despite cues to keep pain free0  No new goals o objective findings except pain now constant vs intermittant.     PT Next Visit Plan  AAROM as tolerated, manual, modalities for pain,  tape,  review isometrics.  cane    PT Home Exercise Plan  table slides and scapular retraction Isometrics    Consulted and Agree with Plan of Care  Patient       Patient will benefit from skilled therapeutic intervention in order to improve the following deficits and impairments:     Visit Diagnosis: Chronic right shoulder pain  Muscle weakness (generalized)  Stiffness  of right shoulder, not elsewhere classified     Problem List Patient Active Problem List   Diagnosis Date Noted  . Nontraumatic complete tear of right rotator cuff 11/15/2017  . Chronic right shoulder pain 11/15/2017  . Acute pain of right shoulder 10/18/2017    HARRIS,KAREN PTA 12/12/2017, 1:27 PM  [facility] [address] [place], [place], [address] Phone: [phone]   Fax:  [phone]  Name: Caleb [name] MRN: [id number] Date of Birth: Jul 31, 1950

## 2017-12-12 NOTE — Progress Notes (Signed)
The patient still complains of significant problems with his right shoulder.  He is 67 years old and is active individual.  We tried a steroid injection and physical therapy.  He had improved function of the shoulder but still pain that wakes him up at night.  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  On exam he has limited mobility of the right shoulder with weakness of the rotator cuff and pain with abduction and external rotation.  He want to try at least one more steroid injection in his shoulder.  I counseled him about the risk and benefits of this and how this can affect blood glucose.  He was willing to try this 1 more time.  The other option would be considering arthroscopic intervention at some point.  He will start taking nabumetone at night and I will send in some Tylenol with codeine to use sparingly.  See him back in a month see how is doing overall.  Procedure Note  Patient: Caleb [name]             Date of Birth: 1950/08/27           MRN: [id number]             Visit Date: 12/12/2017  Procedures: Visit Diagnoses: Chronic right shoulder pain  No procedures performed

## 2017-12-12 NOTE — Progress Notes (Signed)
   Procedure Note  Patient: Caleb RamsClifton Fooks             Date of Birth: 22-Jun-1951           MRN: [id number]             Visit Date: 12/12/2017  Procedures: Visit Diagnoses: Chronic right shoulder pain  Large Joint Inj: R subacromial bursa on 12/12/2017 8:42 AM Indications: pain and diagnostic evaluation Details: 22 G 1.5 in needle  Arthrogram: No  Medications: 3 mL lidocaine 1 %; 40 mg methylPREDNISolone acetate 40 MG/ML Outcome: tolerated well, no immediate complications Procedure, treatment alternatives, risks and benefits explained, specific risks discussed. Consent was given by the patient. Immediately prior to procedure a time out was called to verify the correct patient, procedure, equipment, support staff and site/side marked as required. Patient was prepped and draped in the usual sterile fashion.

## 2017-12-13 NOTE — Therapy (Signed)
[facility] [address] [place], [place], [address] Phone: [phone]   Fax:  [phone]  Physical Therapy Treatment  Patient Details  Name: Caleb [name] MRN: [id number] Date of Birth: [birth year] Referring Provider: Dr. Magnus IvanBlackman    Encounter Date: 12/12/2017  PT End of Session - 12/12/17 1320    Visit Number  5    Number of Visits  16    Date for PT Re-Evaluation  01/20/18    PT Start Time  1104    PT Stop Time  1145    PT Time Calculation (min)  41 min    Activity Tolerance  Patient tolerated treatment well    Behavior During Therapy  [facility] for tasks assessed/performed       Past Medical History:  Diagnosis Date  . High cholesterol   . Type II diabetes mellitus (HCC)     Past Surgical History:  Procedure Laterality Date  . LEFT HEART CATHETERIZATION WITH CORONARY ANGIOGRAM N/A 05/04/2013   Procedure: LEFT HEART CATHETERIZATION WITH CORONARY ANGIOGRAM;  Surgeon: Pamella PertJagadeesh R Ganji, MD;  [facility];  Service: Cardiovascular;  Laterality: N/A;  . NO PAST SURGERIES      There were no vitals filed for this visit.  Subjective Assessment - 12/12/17 1109    Subjective  Saw MD today.  New Med tylenol #3,  Had a new injection.  he see's Md again in 1 month.  had pain the entire  vacation,  not sleeping well. Able to shower without problems.     Currently in Pain?  Yes    Pain Score  7     Pain Location  Shoulder    Pain Orientation  Right;Anterior;Posterior    Pain Descriptors / Indicators  Sore;Aching    Pain Radiating Towards  down arm to wrist    Pain Frequency  Constant    Aggravating Factors   shooting down arm with sitting,  abduction    Pain Relieving Factors  tape    Effect of Pain on Daily Activities  limits enjoyment     Multiple Pain Sites  No                               PT Education - 12/12/17 1320    Education provided  No       PT Short Term Goals - 11/25/17  1226      PT SHORT TERM GOAL #1   Title  Pt will be I with HEP for Rt UE ROM and strength     Time  4    Period  Weeks    Status  New    Target Date  12/23/17      PT SHORT TERM GOAL #2   Title  Pt will be able to get dressed, complete ADLs/hygiene, with 25% less pain    Time  4    Period  Weeks    Status  New    Target Date  12/23/17      PT SHORT TERM GOAL #3   Title  Pt will be able to understand concepts of RICE and use for self care at home.     Time  4    Period  Weeks    Status  New    Target Date  12/23/17        PT Long Term Goals - 11/25/17 1153      PT  LONG TERM GOAL #1   Title  Pt will score <40% impaired on FOTO to demo improved functional mobility.     Time  8    Period  Weeks    Status  New    Target Date  01/20/18      PT LONG TERM GOAL #2   Title  Pt will be able to raise R arm overhead with pain minimal (120 deg ore more) for home tasks, washing the car     Time  8    Period  Weeks    Status  New    Target Date  01/20/18      PT LONG TERM GOAL #3   Title  Pt will be I with HEP upon discharge for Rt UE and posture    Time  8    Period  Weeks    Status  New    Target Date  01/20/18      PT LONG TERM GOAL #4   Title  Pt will be demo 4+/5 strength in Rt UE in all planes to return to part time work, volunteering.     Time  8    Period  Weeks    Status  New    Target Date  01/20/18      PT LONG TERM GOAL #5   Title  Pt will be able to chop, lift saucepan in the kitchen using Rt UE without pain.     Time  8    Period  Weeks    Status  New    Target Date  01/20/18            Plan - 12/12/17 1320    Clinical Impression Statement  Patient see's MD again in 1 month,  if not better surgery is the plan.  Pain is now constant and nothing seems to make oit worse.  He continues to work with HEP however he does not like to do due to being painful.  (Despite cues to keep pain free0  No new goals o objective findings except pain now constant vs  intermittant.     PT Next Visit Plan  AAROM as tolerated, manual, modalities for pain,  tape,  review isometrics.  cane    PT Home Exercise Plan  table slides and scapular retraction Isometrics    Consulted and Agree with Plan of Care  Patient       Patient will benefit from skilled therapeutic intervention in order to improve the following deficits and impairments:     Visit Diagnosis: Chronic right shoulder pain  Muscle weakness (generalized)  Stiffness of right shoulder, not elsewhere classified     Problem List Patient Active Problem List   Diagnosis Date Noted  . Nontraumatic complete tear of right rotator cuff 11/15/2017  . Chronic right shoulder pain 11/15/2017  . Acute pain of right shoulder 10/18/2017    Caleb [name]  PTA 12/13/2017, 2:02 PM  [facility] [address] [place], [place], [address] Phone: [phone]   Fax:  [phone]  Name: Caleb [name] MRN: [id number] Date of Birth: 08/28/1950

## 2017-12-14 ENCOUNTER — Encounter: Payer: Self-pay | Admitting: Physical Therapy

## 2017-12-14 ENCOUNTER — Ambulatory Visit: Payer: Medicare Other | Admitting: Physical Therapy

## 2017-12-14 DIAGNOSIS — M6281 Muscle weakness (generalized): Secondary | ICD-10-CM

## 2017-12-14 DIAGNOSIS — M25511 Pain in right shoulder: Principal | ICD-10-CM

## 2017-12-14 DIAGNOSIS — M25611 Stiffness of right shoulder, not elsewhere classified: Secondary | ICD-10-CM

## 2017-12-14 DIAGNOSIS — G8929 Other chronic pain: Secondary | ICD-10-CM

## 2017-12-14 NOTE — Therapy (Signed)
[facility] [facility] [address] [place], [place], [address] Phone: [phone]   Fax:  [phone]  Physical Therapy Treatment  Patient Details  Name: Caleb [name] MRN: [id number] Date of Birth: 1951/04/27 Referring Provider: Dr. Magnus IvanBlackman    Encounter Date: [name]  PT End of Session - 12/14/17 0849    Visit Number  6    Number of Visits  16    Date for PT Re-Evaluation  01/20/18    PT Start Time  0845    PT Stop Time  0932    PT Time Calculation (min)  47 min    Activity Tolerance  Patient tolerated treatment well    Behavior During Therapy  [facility] for tasks assessed/performed       Past Medical History:  Diagnosis Date  . High cholesterol   . Type II diabetes mellitus (HCC)     Past Surgical History:  Procedure Laterality Date  . LEFT HEART CATHETERIZATION WITH CORONARY ANGIOGRAM N/A 05/04/2013   Procedure: LEFT HEART CATHETERIZATION WITH CORONARY ANGIOGRAM;  Surgeon: Pamella PertJagadeesh R Ganji, MD;  [facility];  Service: Cardiovascular;  Laterality: N/A;  . NO PAST SURGERIES      There were no vitals filed for this visit.  Subjective Assessment - 12/14/17 0848    Subjective  No pain right.  Sore in shoulder.  Slept OK.     Currently in Pain?  No/denies            Caleb [name] Adult PT Treatment/Exercise - 12/14/17 0001      Shoulder Exercises: Seated   Horizontal ABduction  AAROM;Right;10 reps    Flexion  AAROM;Right;20 reps    Abduction  AAROM;Right;20 reps    Other Seated Exercises  circles x 10 each direction       Shoulder Exercises: Isometric Strengthening   Flexion  5X10"    Extension  5X10"    External Rotation  5X10"    Internal Rotation  5X10"    ABduction  5X10"    ADduction  5X10"      Ultrasound   Ultrasound Location  Rt shoulder ant     Ultrasound Parameters  50% , 1 MHz, 1.5 W/cm2  biofreeze     Ultrasound Goals  Pain 8 minutes      Manual Therapy   Soft tissue mobilization  R deltoid,  ant shoulder and biceps    Passive ROM  flexion, abduction, ER and IR to tolerance                PT Short Term Goals - 12/14/17 0948      PT SHORT TERM GOAL #1   Title  Pt will be I with HEP for Rt UE ROM and strength     Status  Achieved      PT SHORT TERM GOAL #2   Title  Pt will be able to get dressed, complete ADLs/hygiene, with 25% less pain    Status  On-going      PT SHORT TERM GOAL #3   Title  Pt will be able to understand concepts of RICE and use for self care at home.     Status  Achieved        PT Long Term Goals - 11/25/17 1153      PT LONG TERM GOAL #1   Title  Pt will score <40% impaired on FOTO to demo improved functional mobility.     Time  8    Period  Weeks    Status  New    Target Date  01/20/18      PT LONG TERM GOAL #2   Title  Pt will be able to raise R arm overhead with pain minimal (120 deg ore more) for home tasks, washing the car     Time  8    Period  Weeks    Status  New    Target Date  01/20/18      PT LONG TERM GOAL #3   Title  Pt will be I with HEP upon discharge for Rt UE and posture    Time  8    Period  Weeks    Status  New    Target Date  01/20/18      PT LONG TERM GOAL #4   Title  Pt will be demo 4+/5 strength in Rt UE in all planes to return to part time work, volunteering.     Time  8    Period  Weeks    Status  New    Target Date  01/20/18      PT LONG TERM GOAL #5   Title  Pt will be able to chop, lift saucepan in the kitchen using Rt UE without pain.     Time  8    Period  Weeks    Status  New    Target Date  01/20/18            Plan - 12/14/17 0949    Clinical Impression Statement  Patient feeling less pain today due to injection effectiveness.  Focused on small controlled ROM and isometrics.  Soreness post session.      PT Treatment/Interventions  ADLs/Self Care Home Management;Electrical Stimulation;Iontophoresis 4mg /ml Dexamethasone;Moist Heat;Cryotherapy;Ultrasound;Patient/family  education;Therapeutic exercise;Therapeutic activities;Functional mobility training;Neuromuscular re-education;Taping;Passive range of motion    PT Next Visit Plan  AAROM as tolerated, manual, modalities for pain,  tape,  review isometrics.  cane    PT Home Exercise Plan  table slides and scapular retraction Isometrics    Consulted and Agree with Plan of Care  Patient       Patient will benefit from skilled therapeutic intervention in order to improve the following deficits and impairments:  Decreased mobility, Hypomobility, Increased muscle spasms, Pain, Postural dysfunction, Impaired UE functional use, Impaired flexibility, Increased fascial restricitons, Decreased strength, Decreased range of motion  Visit Diagnosis: Chronic right shoulder pain  Muscle weakness (generalized)  Stiffness of right shoulder, not elsewhere classified     Problem List Patient Active Problem List   Diagnosis Date Noted  . Nontraumatic complete tear of right rotator cuff 11/15/2017  . Chronic right shoulder pain 11/15/2017  . Acute pain of right shoulder 10/18/2017    Caleb [name], Caleb [name]  [facility] [address] [place], [place], [address] Phone: [phone]   Fax:  [phone]  Name: Caleb [name] MRN: [id number] Date of Birth: 1950-12-30  Caleb [name], PT 12/14/17 12:42 [name] Phone: [phone] Fax: [phone]

## 2017-12-19 ENCOUNTER — Encounter: Payer: Self-pay | Admitting: Physical Therapy

## 2017-12-19 ENCOUNTER — Ambulatory Visit: Payer: Medicare Other | Admitting: Physical Therapy

## 2017-12-19 DIAGNOSIS — M6281 Muscle weakness (generalized): Secondary | ICD-10-CM

## 2017-12-19 DIAGNOSIS — M25511 Pain in right shoulder: Secondary | ICD-10-CM | POA: Diagnosis not present

## 2017-12-19 DIAGNOSIS — M25611 Stiffness of right shoulder, not elsewhere classified: Secondary | ICD-10-CM

## 2017-12-19 DIAGNOSIS — G8929 Other chronic pain: Secondary | ICD-10-CM

## 2017-12-19 NOTE — Therapy (Signed)
[facility] [address] [name], [place], [address] Phone: [phone]   Fax:  [phone]  Physical Therapy Treatment  Patient Details  Name: Caleb [name] MRN: [id number] Date of Birth: 08-07-1950 Referring Provider: Dr. Magnus Ivan    Encounter Date: 12/19/2017  PT End of Session - 12/19/17 0934    Visit Number  7    Number of Visits  16    Date for PT Re-Evaluation  01/20/18    PT Start Time  0845    PT Stop Time  0930    PT Time Calculation (min)  45 min    Activity Tolerance  Patient tolerated treatment well    Behavior During Therapy  [facility] for tasks assessed/performed       Past Medical History:  Diagnosis Date  . High cholesterol   . Type II diabetes mellitus (HCC)     Past Surgical History:  Procedure Laterality Date  . LEFT HEART CATHETERIZATION WITH CORONARY ANGIOGRAM N/A 05/04/2013   Procedure: LEFT HEART CATHETERIZATION WITH CORONARY ANGIOGRAM;  Surgeon: Pamella Pert, MD;  Location: [facility] CATH LAB;  Service: Cardiovascular;  Laterality: N/A;  . NO PAST SURGERIES      There were no vitals filed for this visit.  Subjective Assessment - 12/19/17 0848    Subjective  It not bothering me now.  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  Took pain meds, upset stomach.  Last night was so bad.      Currently in Pain?  No/denies          [facility] Adult PT Treatment/Exercise - 12/19/17 0001      Shoulder Exercises: Supine   Horizontal ABduction  AAROM;Both;10 reps    External Rotation  AAROM;Right;10 reps    Flexion  AAROM;Both;10 reps      Shoulder Exercises: Sidelying   Flexion  AAROM;Right;20 reps    Other Sidelying Exercises  overhead x 10 with UE ranger       Shoulder Exercises: Standing   ABduction  AAROM;Right;10 reps      Shoulder Exercises: Pulleys   Flexion  3 minutes      Ultrasound   Ultrasound Location  Rt posterior shoulder     Ultrasound Parameters   50%, 1.5 W/cm2, 1 MHz     Ultrasound Goals  Pain 8 minutes      Manual Therapy   Soft tissue mobilization  R post cuff , deltoid              PT Education - 12/19/17 0954    Education provided  Yes    Education Details  surgical intevention, limitations if he did decide to have surgery, ROM    Person(s) Educated  Patient    Methods  Explanation    Comprehension  Verbalized understanding       PT Short Term Goals - 12/14/17 0948      PT SHORT TERM GOAL #1   Title  Pt will be I with HEP for Rt UE ROM and strength     Status  Achieved      PT SHORT TERM GOAL #2   Title  Pt will be able to get dressed, complete ADLs/hygiene, with 25% less pain    Status  On-going      PT SHORT TERM GOAL #3   Title  Pt will be able to understand concepts of RICE and use for self care at home.  Status  Achieved        PT Long Term Goals - 11/25/17 1153      PT LONG TERM GOAL #1   Title  Pt will score <40% impaired on FOTO to demo improved functional mobility.     Time  8    Period  Weeks    Status  New    Target Date  01/20/18      PT LONG TERM GOAL #2   Title  Pt will be able to raise R arm overhead with pain minimal (120 deg ore more) for home tasks, washing the car     Time  8    Period  Weeks    Status  New    Target Date  01/20/18      PT LONG TERM GOAL #3   Title  Pt will be I with HEP upon discharge for Rt UE and posture    Time  8    Period  Weeks    Status  New    Target Date  01/20/18      PT LONG TERM GOAL #4   Title  Pt will be demo 4+/5 strength in Rt UE in all planes to return to part time work, volunteering.     Time  8    Period  Weeks    Status  New    Target Date  01/20/18      PT LONG TERM GOAL #5   Title  Pt will be able to chop, lift saucepan in the kitchen using Rt UE without pain.     Time  8    Period  Weeks    Status  New    Target Date  01/20/18            Plan - 12/19/17 0955    Clinical Impression Statement  Patient with  relatively low levels of pain today.  WOrked on posterior shoulder mm as he described it as burning with raising arm.  No pain post session.  This weekend was very difficult.  He is coming to realize he may require surgery as he is reporting no functional improvements in the past 3 mos.     PT Treatment/Interventions  ADLs/Self Care Home Management;Electrical Stimulation;Iontophoresis 4mg /ml Dexamethasone;Moist Heat;Cryotherapy;Ultrasound;Patient/family education;Therapeutic exercise;Therapeutic activities;Functional mobility training;Neuromuscular re-education;Taping;Passive range of motion    PT Next Visit Plan  AAROM as tolerated, manual, modalities for pain,  tape,  review isometrics.  cane. US/STW post cuff    PT Home Exercise Plan  table slides and scapular retraction Isometrics    Consulted and Agree with Plan of Care  Patient       Patient will benefit from skilled therapeutic intervention in order to improve the following deficits and impairments:  Decreased mobility, Hypomobility, Increased muscle spasms, Pain, Postural dysfunction, Impaired UE functional use, Impaired flexibility, Increased fascial restricitons, Decreased strength, Decreased range of motion  Visit Diagnosis: Chronic right shoulder pain  Muscle weakness (generalized)  Stiffness of right shoulder, not elsewhere classified     Problem List Patient Active Problem List   Diagnosis Date Noted  . Nontraumatic complete tear of right rotator cuff 11/15/2017  . Chronic right shoulder pain 11/15/2017  . Acute pain of right shoulder 10/18/2017    PAA,JENNIFER 12/19/2017, 10:04 AM  [facility] [address] [place], [place], [address] Phone: [phone]   Fax:  [phone]  Name: Caleb [name] MRN: [id number] Date of Birth: 20-May-1951  Karie Mainland, PT  12/19/17 10:05 AM Phone: [phone] Fax: [phone]

## 2017-12-21 ENCOUNTER — Encounter: Payer: Self-pay | Admitting: Physical Therapy

## 2017-12-21 ENCOUNTER — Ambulatory Visit: Payer: Medicare Other | Admitting: Physical Therapy

## 2017-12-21 DIAGNOSIS — M25511 Pain in right shoulder: Secondary | ICD-10-CM | POA: Diagnosis not present

## 2017-12-21 DIAGNOSIS — G8929 Other chronic pain: Secondary | ICD-10-CM

## 2017-12-21 DIAGNOSIS — M25611 Stiffness of right shoulder, not elsewhere classified: Secondary | ICD-10-CM

## 2017-12-21 DIAGNOSIS — M6281 Muscle weakness (generalized): Secondary | ICD-10-CM

## 2017-12-21 NOTE — Therapy (Signed)
[facility] [address] [place], [place], [address] Phone: [phone]   Fax:  [phone]  Physical Therapy Treatment  Patient Details  Name: Caleb [name] MRN: [id number] Date of Birth: Jul 09, 1951 Referring Provider: Dr. Magnus Ivan    Encounter Date: 12/21/2017  PT End of Session - 12/21/17 0925    Visit Number  8    Number of Visits  16    Date for PT Re-Evaluation  01/20/18    PT Start Time  0847    PT Stop Time  0939    PT Time Calculation (min)  52 min    Activity Tolerance  Patient tolerated treatment well    Behavior During Therapy  [facility] for tasks assessed/performed       Past Medical History:  Diagnosis Date  . High cholesterol   . Type II diabetes mellitus (HCC)     Past Surgical History:  Procedure Laterality Date  . LEFT HEART CATHETERIZATION WITH CORONARY ANGIOGRAM N/A 05/04/2013   Procedure: LEFT HEART CATHETERIZATION WITH CORONARY ANGIOGRAM;  Surgeon: Pamella Pert, MD;  Location: [facility] CATH LAB;  Service: Cardiovascular;  Laterality: N/A;  . NO PAST SURGERIES      There were no vitals filed for this visit.  Subjective Assessment - 12/21/17 0849    Subjective  I was surprises about the soreness but my shoulder feels better.  Able to reach but I feel it.  Pain intermittant.      Currently in Pain?  Yes    Pain Score  5     Pain Location  Shoulder    Pain Orientation  Right;Anterior  scalenes,  collar bone    Pain Descriptors / Indicators  Sore    Pain Radiating Towards  every now and then (Yesterday) to wrist,  into neck and along collar bone.      Pain Frequency  Intermittent    Aggravating Factors   neck rotation to right,  cold airconditioning    Pain Relieving Factors  shot,  pill,  rest         [facility] PT Assessment - 12/21/17 0001      PROM   Overall PROM Comments  155 cane flexion                   [facility] Adult PT Treatment/Exercise - 12/21/17 0001      Shoulder  Exercises: Supine   Protraction  AAROM;Both cane.    Horizontal ABduction  AAROM;Both;10 reps cane    External Rotation  AAROM;Right;10 reps cane    Flexion  AAROM;Both      Shoulder Exercises: Isometric Strengthening   Flexion  5X10"    Extension  5X10"    External Rotation  5X10"    Internal Rotation  5X10"    ABduction  5X10"    ADduction  5X10"      Moist Heat Therapy   Number Minutes Moist Heat  15 Minutes    Moist Heat Location  Shoulder;Cervical      Manual Therapy   Manual Therapy  Soft tissue mobilization    Soft tissue mobilization  right shoulder neck anterior chest PEC, tissue sore tender.  able to soften teres sensitive able to decrease soreness with reaching.             PT Education - 12/21/17 0925    Education provided  No       PT Short Term Goals - 12/14/17 0948      PT  SHORT TERM GOAL #1   Title  Pt will be I with HEP for Rt UE ROM and strength     Status  Achieved      PT SHORT TERM GOAL #2   Title  Pt will be able to get dressed, complete ADLs/hygiene, with 25% less pain    Status  On-going      PT SHORT TERM GOAL #3   Title  Pt will be able to understand concepts of RICE and use for self care at home.     Status  Achieved        PT Long Term Goals - 11/25/17 1153      PT LONG TERM GOAL #1   Title  Pt will score <40% impaired on FOTO to demo improved functional mobility.     Time  8    Period  Weeks    Status  New    Target Date  01/20/18      PT LONG TERM GOAL #2   Title  Pt will be able to raise R arm overhead with pain minimal (120 deg ore more) for home tasks, washing the car     Time  8    Period  Weeks    Status  New    Target Date  01/20/18      PT LONG TERM GOAL #3   Title  Pt will be I with HEP upon discharge for Rt UE and posture    Time  8    Period  Weeks    Status  New    Target Date  01/20/18      PT LONG TERM GOAL #4   Title  Pt will be demo 4+/5 strength in Rt UE in all planes to return to part time work,  volunteering.     Time  8    Period  Weeks    Status  New    Target Date  01/20/18      PT LONG TERM GOAL #5   Title  Pt will be able to chop, lift saucepan in the kitchen using Rt UE without pain.     Time  8    Period  Weeks    Status  New    Target Date  01/20/18            Plan - 12/21/17 0926    Clinical Impression Statement  Pain continues to vary in shoulder,  he was able to reach flexion overhead X1 without pain.  NecK along clavicle  sore initially. ROM improving despite patient note of no functional improvement. patient declined [place] and tape today.    PT Next Visit Plan  AAROM as tolerated, manual, modalities for pain,  tape,  review isometrics.  cane. US/STW post cuff    PT Home Exercise Plan  table slides and scapular retraction Isometrics    Consulted and Agree with Plan of Care  Patient       Patient will benefit from skilled therapeutic intervention in order to improve the following deficits and impairments:     Visit Diagnosis: Chronic right shoulder pain  Muscle weakness (generalized)  Stiffness of right shoulder, not elsewhere classified     Problem List Patient Active Problem List   Diagnosis Date Noted  . Nontraumatic complete tear of right rotator cuff 11/15/2017  . Chronic right shoulder pain 11/15/2017  . Acute pain of right shoulder 10/18/2017    HARRIS,KAREN PTA 12/21/2017, 9:29 AM  [place]  [facility] [address] [place], [place], [address] Phone: [phone]   Fax:  [phone]  Name: Caleb [name] MRN: [id number] Date of Birth: 1950/08/04

## 2017-12-26 ENCOUNTER — Ambulatory Visit: Payer: Medicare Other | Admitting: Physical Therapy

## 2017-12-26 DIAGNOSIS — M25511 Pain in right shoulder: Secondary | ICD-10-CM | POA: Diagnosis not present

## 2017-12-26 DIAGNOSIS — M6281 Muscle weakness (generalized): Secondary | ICD-10-CM

## 2017-12-26 DIAGNOSIS — G8929 Other chronic pain: Secondary | ICD-10-CM

## 2017-12-26 DIAGNOSIS — M25611 Stiffness of right shoulder, not elsewhere classified: Secondary | ICD-10-CM

## 2017-12-26 NOTE — Therapy (Signed)
[facility] [address] [place], [place], [address] Phone: [phone]   Fax:  [phone]  Physical Therapy Treatment  Patient Details  Name: Caleb [name] MRN: [id number] Date of Birth: 10-03-1950 Referring Provider: Dr. Magnus Ivan    Encounter Date: 12/26/2017  PT End of Session - 12/26/17 0851    Visit Number  9    Number of Visits  16    Date for PT Re-Evaluation  01/20/18    PT Start Time  0851    PT Stop Time  0936    PT Time Calculation (min)  45 min    Activity Tolerance  Patient tolerated treatment well    Behavior During Therapy  [facility] for tasks assessed/performed       Past Medical History:  Diagnosis Date  . High cholesterol   . Type II diabetes mellitus (HCC)     Past Surgical History:  Procedure Laterality Date  . LEFT HEART CATHETERIZATION WITH CORONARY ANGIOGRAM N/A 05/04/2013   Procedure: LEFT HEART CATHETERIZATION WITH CORONARY ANGIOGRAM;  Surgeon: Pamella Pert, MD;  Location: [facility] CATH LAB;  Service: Cardiovascular;  Laterality: N/A;  . NO PAST SURGERIES      There were no vitals filed for this visit.  Subjective Assessment - 12/26/17 1130    Subjective  Right now its good.  But last night was a different story.      Currently in Pain?  Yes    Pain Score  2     Pain Location  Shoulder    Pain Orientation  Right    Pain Descriptors / Indicators  Sore    Pain Type  Chronic pain    Pain Onset  More than a month ago    Pain Frequency  Intermittent    Aggravating Factors   night time     Pain Relieving Factors  injection, meds, rest             [facility] Adult PT Treatment/Exercise - 12/26/17 0001      Shoulder Exercises: Standing   Horizontal ABduction  AAROM;Right;10 reps    External Rotation  Strengthening;Both;10 reps    Theraband Level (Shoulder External Rotation)  Level 2 (Red)    Flexion  AAROM;Right;20 reps    ABduction  AAROM;Right;10 reps    Extension  Strengthening;Both;20  reps;Theraband    Theraband Level (Shoulder Extension)  Level 2 (Red);Level 3 (Green)    Row  Strengthening;Both;20 reps;Theraband    Theraband Level (Shoulder Row)  Level 2 (Red)      Shoulder Exercises: Pulleys   Flexion  2 minutes    Scaption  2 minutes    Other Pulley Exercises  IR standing 2 min       Ultrasound   Ultrasound Location  R ant shoulder  used biofreeze     Ultrasound Parameters  50%, 1.2 W/cm2, 1 MHz    Ultrasound Goals  Pain 8 minutes      Manual Therapy   Soft tissue mobilization  Rt anterior/mid deltoid, biceps and superior shoulder     Passive ROM  Rt shoulder all planes              PT Education - 12/26/17 0941    Education provided  Yes    Education Details  pain referral into lower arm     Person(s) Educated  Patient    Methods  Explanation    Comprehension  Verbalized understanding       PT Short Term  Goals - 12/14/17 0948      PT SHORT TERM GOAL #1   Title  Pt will be I with HEP for Rt UE ROM and strength     Status  Achieved      PT SHORT TERM GOAL #2   Title  Pt will be able to get dressed, complete ADLs/hygiene, with 25% less pain    Status  On-going      PT SHORT TERM GOAL #3   Title  Pt will be able to understand concepts of RICE and use for self care at home.     Status  Achieved        PT Long Term Goals - 11/25/17 1153      PT LONG TERM GOAL #1   Title  Pt will score <40% impaired on FOTO to demo improved functional mobility.     Time  8    Period  Weeks    Status  New    Target Date  01/20/18      PT LONG TERM GOAL #2   Title  Pt will be able to raise R arm overhead with pain minimal (120 deg ore more) for home tasks, washing the car     Time  8    Period  Weeks    Status  New    Target Date  01/20/18      PT LONG TERM GOAL #3   Title  Pt will be I with HEP upon discharge for Rt UE and posture    Time  8    Period  Weeks    Status  New    Target Date  01/20/18      PT LONG TERM GOAL #4   Title  Pt will be  demo 4+/5 strength in Rt UE in all planes to return to part time work, volunteering.     Time  8    Period  Weeks    Status  New    Target Date  01/20/18      PT LONG TERM GOAL #5   Title  Pt will be able to chop, lift saucepan in the kitchen using Rt UE without pain.     Time  8    Period  Weeks    Status  New    Target Date  01/20/18            Plan - 12/26/17 0855    Clinical Impression Statement  Patient with less pain with overhead reaching, but pain still mod to severe at night.  Progressing slowly, gave HEP for resistance work which has not done yet.     PT Treatment/Interventions  ADLs/Self Care Home Management;Electrical Stimulation;Iontophoresis 4mg /ml Dexamethasone;Moist Heat;Cryotherapy;Ultrasound;Patient/family education;Therapeutic exercise;Therapeutic activities;Functional mobility training;Neuromuscular re-education;Taping;Passive range of motion    PT Next Visit Plan  FOTO , AAROM as tolerated, manual, modalities for pain,  tape,  review isometrics.  cane. US/STW post cuff    PT Home Exercise Plan  table slides and scapular retraction Isometrics, red/green ER unattached, row, ext     Consulted and Agree with Plan of Care  Patient       Patient will benefit from skilled therapeutic intervention in order to improve the following deficits and impairments:  Decreased mobility, Hypomobility, Increased muscle spasms, Pain, Postural dysfunction, Impaired UE functional use, Impaired flexibility, Increased fascial restricitons, Decreased strength, Decreased range of motion  Visit Diagnosis: Chronic right shoulder pain  Muscle weakness (generalized)  Stiffness of right shoulder, not  elsewhere classified     Problem List Patient Active Problem List   Diagnosis Date Noted  . Nontraumatic complete tear of right rotator cuff 11/15/2017  . Chronic right shoulder pain 11/15/2017  . Acute pain of right shoulder 10/18/2017    Marlowe Lawes 12/26/2017, 11:33 AM  [facility] [address] [place], [place], [address] Phone: [phone]   Fax:  [phone]  Name: Erin Uecker MRN: [id number] Date of Birth: 11-Mar-1951  Karie Mainland, PT 12/26/17 11:33 AM Phone: [phone] Fax: [phone]

## 2017-12-26 NOTE — Patient Instructions (Signed)
Access Code: [email or web address]  URL: [email or web address]  Date: 12/26/2017  Prepared by: Karie MainlandJennifer Paa   Exercises  Standing Shoulder External Rotation with Resistance - 10 reps - 2 sets - 5 hold - 1x daily - 7x weekly  Standing Shoulder Row with Anchored Resistance - 10 reps - 2 sets - 5 hold - 1x daily - 7x weekly  Shoulder Extension with Resistance - Neutral - 10 reps - 2 sets - 5 hold - 1x daily - 7x weekly

## 2017-12-28 ENCOUNTER — Ambulatory Visit: Payer: Medicare Other | Admitting: Physical Therapy

## 2017-12-28 ENCOUNTER — Encounter: Payer: Self-pay | Admitting: Physical Therapy

## 2017-12-28 DIAGNOSIS — M6281 Muscle weakness (generalized): Secondary | ICD-10-CM

## 2017-12-28 DIAGNOSIS — M25611 Stiffness of right shoulder, not elsewhere classified: Secondary | ICD-10-CM

## 2017-12-28 DIAGNOSIS — M25511 Pain in right shoulder: Principal | ICD-10-CM

## 2017-12-28 DIAGNOSIS — G8929 Other chronic pain: Secondary | ICD-10-CM

## 2017-12-28 NOTE — Therapy (Signed)
[facility] [address] [place], [place], [address] Phone: [phone]   Fax:  [phone]  Physical Therapy Treatment  Patient Details  Name: Caleb [name] MRN: [id number] Date of Birth: October 26, 1950 Referring Provider: Dr. Magnus Ivan    Encounter Date: 12/28/2017  PT End of Session - 12/28/17 1051    Visit Number  10    Number of Visits  16    Date for PT Re-Evaluation  01/20/18    PT Start Time  0845    PT Stop Time  0945    PT Time Calculation (min)  60 min    Activity Tolerance  Patient tolerated treatment well    Behavior During Therapy  [facility] for tasks assessed/performed       Past Medical History:  Diagnosis Date  . High cholesterol   . Type II diabetes mellitus (HCC)     Past Surgical History:  Procedure Laterality Date  . LEFT HEART CATHETERIZATION WITH CORONARY ANGIOGRAM N/A 05/04/2013   Procedure: LEFT HEART CATHETERIZATION WITH CORONARY ANGIOGRAM;  Surgeon: Pamella Pert, MD;  Location: [facility] CATH LAB;  Service: Cardiovascular;  Laterality: N/A;  . NO PAST SURGERIES      There were no vitals filed for this visit.  Subjective Assessment - 12/28/17 0857    Subjective  Pain is worse at night . I can do my exercises easier.  I am able to reach overhead and tie shoes. PT helps about 2-3 days then pain eases back.  I want to try skipping the [place] today and do massage and exercise.    Currently in Pain?  Yes    Pain Score  4  up to 7-8/10    Pain Location  Shoulder    Pain Orientation  Right;Anterior;Posterior    Pain Descriptors / Indicators  Burning;Aching;Shooting    Pain Radiating Towards  wrist at night,  sometimes into neck    Aggravating Factors   chopping food,  night pain,  lifting    Pain Relieving Factors  meds rest    Effect of Pain on Daily Activities  limits sleeping         [facility] PT Assessment - 12/28/17 0001      Observation/Other Assessments   Focus on Therapeutic Outcomes (FOTO)   20  points worse      AROM   Right Shoulder Flexion  145 Degrees    Right Shoulder ABduction  145 Degrees    Right Shoulder External Rotation  73 Degrees                   [facility] Adult PT Treatment/Exercise - 12/28/17 0001      Shoulder Exercises: Standing   Extension  Strengthening    Theraband Level (Shoulder Extension)  Level 3 (Green)    Row  Strengthening;Both;20 reps;Theraband    Theraband Level (Shoulder Row)  Level 3 (Green)    Other Standing Exercises  ball on wall 15 seconds circles,  flexion,  horizontal abd/ Add,  presses.      Shoulder Exercises: Stretch   Other Shoulder Stretches  pper trap stretch sitting 2 reps moderate cues To try to see if night pain in arm decreases      Moist Heat Therapy   Number Minutes Moist Heat  15 Minutes    Moist Heat Location  Shoulder      Manual Therapy   Soft tissue mobilization  right shoulder lateral neck,  peri scapular,  gentle stretches pecs,  strumming  upper trapsw             PT Education - 12/28/17 1049    Education provided  Yes    Education Details  No exercise will heal and reattach a muscle that is torn and retracted.     Person(s) Educated  Patient    Methods  Explanation    Comprehension  Verbalized understanding       PT Short Term Goals - 12/28/17 1054      PT SHORT TERM GOAL #1   Title  Pt will be I with HEP for Rt UE ROM and strength     Time  4    Period  Weeks    Status  Achieved      PT SHORT TERM GOAL #2   Title  Pt will be able to get dressed, complete ADLs/hygiene, with 25% less pain    Baseline  able to do more ADL,  tie shoes with pain about the same at times    Time  4    Period  Weeks    Status  On-going      PT SHORT TERM GOAL #3   Title  Pt will be able to understand concepts of RICE and use for self care at home.     Time  4    Period  Weeks    Status  Achieved        PT Long Term Goals - 11/25/17 1153      PT LONG TERM GOAL #1   Title  Pt will score <40%  impaired on FOTO to demo improved functional mobility.     Time  8    Period  Weeks    Status  New    Target Date  01/20/18      PT LONG TERM GOAL #2   Title  Pt will be able to raise R arm overhead with pain minimal (120 deg ore more) for home tasks, washing the car     Time  8    Period  Weeks    Status  New    Target Date  01/20/18      PT LONG TERM GOAL #3   Title  Pt will be I with HEP upon discharge for Rt UE and posture    Time  8    Period  Weeks    Status  New    Target Date  01/20/18      PT LONG TERM GOAL #4   Title  Pt will be demo 4+/5 strength in Rt UE in all planes to return to part time work, volunteering.     Time  8    Period  Weeks    Status  New    Target Date  01/20/18      PT LONG TERM GOAL #5   Title  Pt will be able to chop, lift saucepan in the kitchen using Rt UE without pain.     Time  8    Period  Weeks    Status  New    Target Date  01/20/18            Plan - 12/28/17 1051    Clinical Impression Statement  Patient notes he can reach over head now and can tie his shoes easier however FOTO score 20 points worse.  AROM flexion and abduction 145 and ER 73 degrees.  No pain noted at end of session.  PT informed of FOTO score.  PT Next Visit Plan  , AAROM AROM as tolerated, manual, modalities for pain,  .  cane. US/STW post cuff    PT Home Exercise Plan  table slides and scapular retraction Isometrics, red/green ER unattached, row, ext     Consulted and Agree with Plan of Care  Patient       Patient will benefit from skilled therapeutic intervention in order to improve the following deficits and impairments:     Visit Diagnosis: Chronic right shoulder pain  Muscle weakness (generalized)  Stiffness of right shoulder, not elsewhere classified     Problem List Patient Active Problem List   Diagnosis Date Noted  . Nontraumatic complete tear of right rotator cuff 11/15/2017  . Chronic right shoulder pain 11/15/2017  . Acute pain of  right shoulder 10/18/2017    HARRIS,KAREN PTA 12/28/2017, 10:59 AM  [facility] [address] [place], [place], [address] Phone: [phone]   Fax:  [phone]  Name: Caleb [name] MRN: [id number] Date of Birth: 1950/11/02

## 2018-01-02 ENCOUNTER — Ambulatory Visit: Payer: Medicare Other | Admitting: Physical Therapy

## 2018-01-02 DIAGNOSIS — M25611 Stiffness of right shoulder, not elsewhere classified: Secondary | ICD-10-CM | POA: Diagnosis not present

## 2018-01-02 DIAGNOSIS — G8929 Other chronic pain: Secondary | ICD-10-CM

## 2018-01-02 DIAGNOSIS — M25511 Pain in right shoulder: Principal | ICD-10-CM

## 2018-01-02 DIAGNOSIS — M6281 Muscle weakness (generalized): Secondary | ICD-10-CM | POA: Diagnosis not present

## 2018-01-02 NOTE — Therapy (Signed)
[place], [place], [address] Phone: [phone]   Fax:  [phone]  Physical Therapy Treatment  Patient Details  Name: Caleb [name] MRN: [id number] Date of Birth: Dec 20, 1950 Referring Provider: Dr. Ninfa Linden    Encounter Date: 01/02/2018  PT End of Session - 01/02/18 0850    Visit Number  11    Number of Visits  16    Date for PT Re-Evaluation  01/20/18    PT Start Time  0845    PT Stop Time  0940    PT Time Calculation (min)  55 min    Activity Tolerance  Patient tolerated treatment well    Behavior During Therapy  [facility] for tasks assessed/performed       Past Medical History:  Diagnosis Date  . High cholesterol   . Type II diabetes mellitus ([place])     Past Surgical History:  Procedure Laterality Date  . LEFT HEART CATHETERIZATION WITH CORONARY ANGIOGRAM N/A 05/04/2013   Procedure: LEFT HEART CATHETERIZATION WITH CORONARY ANGIOGRAM;  Surgeon: Laverda Page, MD;  Location: [facility] CATH LAB;  Service: Cardiovascular;  Laterality: N/A;  . NO PAST SURGERIES      There were no vitals filed for this visit.  Subjective Assessment - 01/02/18 0855    Subjective  Was hurting all weekend.  The therapy helps temporarily.  Sees MD 7/9.  Will finish PT this week.     Diagnostic tests  MRI Severe rotator cuff tendinopathy/tendinosis , full-thickness retracted tear involving the supraspinatus tendon, Moderate AC joint degenerative changes    Currently in Pain?  Yes    Pain Score  7     Pain Location  Shoulder    Pain Orientation  Right    Pain Relieving Factors  meds, rest, heat, ice, massage             [facility] Adult PT Treatment/Exercise - 01/02/18 0001      Self-Care   Other Self-Care Comments   medications, RICE, IFC and poan to DC next visit       Shoulder Exercises: Isometric Strengthening   Flexion  5X10"    Extension  5X10"    External Rotation  5X10"    Internal Rotation  5X10"    ABduction  5X10"    ADduction  5X10"      Modalities   Modalities  Electrical Stimulation      Moist Heat Therapy   Number Minutes Moist Heat  15 Minutes    Moist Heat Location  Shoulder      Electrical Stimulation   Electrical Stimulation Location  Rt shoulder    Electrical Stimulation Action  IFC    Electrical Stimulation Parameters  to tol    Electrical Stimulation Goals  Pain      Manual Therapy   Passive ROM  all planes Rt shoulder to tolerance              PT Education - 01/02/18 1034    Education provided  Yes    Education Details  discussed FOTO results, POC and IFC for pain relief, take meds as directed     Person(s) Educated  Patient    Methods  Explanation    Comprehension  Verbalized understanding       PT Short Term Goals - 12/28/17 1054      PT SHORT TERM GOAL #1   Title  Pt will be I with HEP for Rt UE ROM and strength  Time  4    Period  Weeks    Status  Achieved      PT SHORT TERM GOAL #2   Title  Pt will be able to get dressed, complete ADLs/hygiene, with 25% less pain    Baseline  able to do more ADL,  tie shoes with pain about the same at times    Time  4    Period  Weeks    Status  On-going      PT SHORT TERM GOAL #3   Title  Pt will be able to understand concepts of RICE and use for self care at home.     Time  4    Period  Weeks    Status  Achieved        PT Long Term Goals - 01/02/18 1035      PT LONG TERM GOAL #1   Title  Pt will score <40% impaired on FOTO to demo improved functional mobility.     Status  On-going      PT LONG TERM GOAL #2   Title  Pt will be able to raise R arm overhead with pain minimal (120 deg ore more) for home tasks, washing the car     Baseline  pain can be min to mod    Status  Partially Met      PT LONG TERM GOAL #3   Title  Pt will be I with HEP upon discharge for Rt UE and posture    Status  On-going      PT LONG TERM GOAL #4   Title  Pt will be demo 4+/5 strength in Rt UE in all  planes to return to part time work, volunteering.     Status  Unable to assess      PT LONG TERM GOAL #5   Title  Pt will be able to chop, lift saucepan in the kitchen using Rt UE without pain.     Status  On-going            Plan - 01/02/18 1036    Clinical Impression Statement  Patient has maintained decent ROM but continues with pain, mostly in evening hours.  He does agree that PT has helped with certain ADLs but his pain is poorly controlled.  He does not like the way his strogner meds make him feel and he is concerned about kidney and liver damage with the other med.  He plans to call MD and take as directed until he sees MD again in about 2 weeks.  He agrees to [employer] as he is not making more progress and can manage HEP independently.     PT Treatment/Interventions  ADLs/Self Care Home Management;Electrical Stimulation;Iontophoresis '4mg'$ /ml Dexamethasone;Moist Heat;Cryotherapy;Ultrasound;Patient/family education;Therapeutic exercise;Therapeutic activities;Functional mobility training;Neuromuscular re-education;Taping;Passive range of motion    PT Next Visit Plan  DC, FOTO, Info on home TENS, finalize HEP, MMT, ROM    PT Home Exercise Plan  table slides and scapular retraction Isometrics, red/green ER unattached, row, ext     Consulted and Agree with Plan of Care  Patient       Patient will benefit from skilled therapeutic intervention in order to improve the following deficits and impairments:  Decreased mobility, Hypomobility, Increased muscle spasms, Pain, Postural dysfunction, Impaired UE functional use, Impaired flexibility, Increased fascial restricitons, Decreased strength, Decreased range of motion  Visit Diagnosis: Chronic right shoulder pain  Muscle weakness (generalized)  Stiffness of right shoulder, not elsewhere classified  Problem List Patient Active Problem List   Diagnosis Date Noted  . Nontraumatic complete tear of right rotator cuff 11/15/2017  .  Chronic right shoulder pain 11/15/2017  . Acute pain of right shoulder 10/18/2017    PAA,JENNIFER 01/02/2018, 10:40 AM  [place], [place], [address] Phone: [phone]   Fax:  [phone]  Name: Caleb [name] MRN: [id number] Date of Birth: [birth year]  Raeford Razor, PT 01/02/18 10:41 AM Phone: [phone] Fax: [phone]

## 2018-01-04 ENCOUNTER — Encounter: Payer: Self-pay | Admitting: Physical Therapy

## 2018-01-04 ENCOUNTER — Ambulatory Visit: Payer: Medicare Other | Admitting: Physical Therapy

## 2018-01-04 DIAGNOSIS — M25511 Pain in right shoulder: Principal | ICD-10-CM

## 2018-01-04 DIAGNOSIS — M6281 Muscle weakness (generalized): Secondary | ICD-10-CM

## 2018-01-04 DIAGNOSIS — M25611 Stiffness of right shoulder, not elsewhere classified: Secondary | ICD-10-CM

## 2018-01-04 DIAGNOSIS — G8929 Other chronic pain: Secondary | ICD-10-CM

## 2018-01-04 NOTE — Therapy (Signed)
[place], [place], [address] Phone: [phone]   Fax:  [phone]  Physical Therapy Treatment/Discharge  Patient Details  Name: Caleb [name] MRN: [id number] Date of Birth: May 23, 1951 Referring Provider: Dr. Ninfa Linden    Encounter Date: 01/04/2018  PT End of Session - 01/04/18 0956    Visit Number  12    PT Start Time  0850    PT Stop Time  0943    PT Time Calculation (min)  53 min    Activity Tolerance  Patient tolerated treatment well    Behavior During Therapy  [facility] for tasks assessed/performed       Past Medical History:  Diagnosis Date  . High cholesterol   . Type II diabetes mellitus ([place])     Past Surgical History:  Procedure Laterality Date  . LEFT HEART CATHETERIZATION WITH CORONARY ANGIOGRAM N/A 05/04/2013   Procedure: LEFT HEART CATHETERIZATION WITH CORONARY ANGIOGRAM;  Surgeon: Laverda Page, MD;  Location: [facility] CATH LAB;  Service: Cardiovascular;  Laterality: N/A;  . NO PAST SURGERIES      There were no vitals filed for this visit.  Subjective Assessment - 01/04/18 0852    Subjective  It was hurting all night long.  Last visit I was good all day.  I took the medicine and it didnt really help.     Currently in Pain?  Yes    Pain Score  7          [facility] PT Assessment - 01/04/18 0001      Observation/Other Assessments   Focus on Therapeutic Outcomes (FOTO)   65%      AROM   Right Shoulder Flexion  150 Degrees    Right Shoulder ABduction  146 Degrees    Right Shoulder Internal Rotation  -- FR to L lumbar with Rt UE with pain     Right Shoulder External Rotation  -- FR to back of neck with effort      Strength   Right Shoulder Flexion  4/5 pain     Right Shoulder ABduction  4/5 pain     Right Shoulder Internal Rotation  4+/5    Right Shoulder External Rotation  4-/5           [facility] Adult PT Treatment/Exercise - 01/04/18 0001      Self-Care   Other Self-Care  Comments   DC, HEP final, band ex, TENS unit, progress despite pain       Shoulder Exercises: Supine   Horizontal ABduction  AAROM    External Rotation  AAROM    Flexion  AAROM    Other Supine Exercises  supine cane x 10 each as above       Shoulder Exercises: Standing   External Rotation  Strengthening;Right;10 reps    Internal Rotation  Strengthening;Right;10 reps    Flexion  Strengthening;Right;15 reps    ABduction  Strengthening;Right;10 reps    Extension  Strengthening;Both;15 reps    Theraband Level (Shoulder Extension)  Level 4 (Blue)    Row  Strengthening;Both;20 reps    Theraband Level (Shoulder Row)  Level 4 (Blue)      Modalities   Modalities  Electrical Stimulation      Moist Heat Therapy   Moist Heat Location  Shoulder      Electrical Stimulation   Electrical Stimulation Location  Rt shoulder    Electrical Stimulation Action  IFC    Electrical Stimulation Parameters  to tol  Electrical Stimulation Goals  Pain      Manual Therapy   Soft tissue mobilization  deltoid, ant shoulder    Passive ROM  all planes Rt shoulder to tolerance              PT Education - 01/04/18 0955    Education provided  Yes    Education Details  self care, DC     Person(s) Educated  Patient    Methods  Explanation    Comprehension  Verbalized understanding       PT Short Term Goals - 01/04/18 0956      PT SHORT TERM GOAL #1   Title  Pt will be I with HEP for Rt UE ROM and strength     Status  Achieved      PT SHORT TERM GOAL #2   Title  Pt will be able to get dressed, complete ADLs/hygiene, with 25% less pain    Baseline  pain is about the same     Status  Achieved      PT SHORT TERM GOAL #3   Title  Pt will be able to understand concepts of RICE and use for self care at home.     Status  Achieved        PT Long Term Goals - 01/04/18 0956      PT LONG TERM GOAL #1   Title  Pt will score <40% impaired on FOTO to demo improved functional mobility.     Status   Not Met      PT LONG TERM GOAL #2   Title  Pt will be able to raise R arm overhead with pain minimal (120 deg ore more) for home tasks, washing the car     Baseline  pain can be min to mod    Status  Partially Met      PT LONG TERM GOAL #3   Title  Pt will be I with HEP upon discharge for Rt UE and posture    Status  Achieved      PT LONG TERM GOAL #4   Title  Pt will be demo 4+/5 strength in Rt UE in all planes to return to part time work, volunteering.     Baseline  about 4/5 with pain     Status  Partially Met      PT LONG TERM GOAL #5   Title  Pt will be able to chop, lift saucepan in the kitchen using Rt UE without pain.     Baseline  pain moderate    Status  Partially Met            Plan - 01/04/18 0957    Clinical Impression Statement  Patient is discharged from PT.  He has improved to a degree in ROM and strength.  Pain with MMT in all planes. He is now realizing he may need surgery and would be open to it due to the severe pain he has at night.  He has partially met some LTGs.      PT Home Exercise Plan  table slides and scapular retraction Isometrics, red/green ER unattached, row, ext     Consulted and Agree with Plan of Care  Patient       Patient will benefit from skilled therapeutic intervention in order to improve the following deficits and impairments:     Visit Diagnosis: Chronic right shoulder pain  Muscle weakness (generalized)  Stiffness of right shoulder, not elsewhere  classified     Problem List Patient Active Problem List   Diagnosis Date Noted  . Nontraumatic complete tear of right rotator cuff 11/15/2017  . Chronic right shoulder pain 11/15/2017  . Acute pain of right shoulder 10/18/2017    PAA,JENNIFER 01/04/2018, 9:59 AM  [facility] [address] [place], [place], [address] Phone: [phone]   Fax:  [phone]  Name: Caleb [name] MRN: [id number] Date of Birth:  April 22, 1951  PHYSICAL THERAPY DISCHARGE SUMMARY  Visits from Start of Care: 12  Current functional level related to goals / functional outcomes: See above    Remaining deficits: Pain , ROM and strength    Education / Equipment: HEP, RICE, posture, Home TENS , options for self care and pain  Plan: Patient agrees to discharge.  Patient goals were partially met. Patient is being discharged due to lack of progress.  ?????    Raeford Razor, PT 01/04/18 10:01 AM Phone: [phone] Fax: [phone]

## 2018-01-17 ENCOUNTER — Ambulatory Visit (INDEPENDENT_AMBULATORY_CARE_PROVIDER_SITE_OTHER): Payer: BC Managed Care – PPO | Admitting: Orthopaedic Surgery

## 2018-01-17 ENCOUNTER — Encounter (INDEPENDENT_AMBULATORY_CARE_PROVIDER_SITE_OTHER): Payer: Self-pay | Admitting: Orthopaedic Surgery

## 2018-01-17 DIAGNOSIS — M25511 Pain in right shoulder: Secondary | ICD-10-CM

## 2018-01-17 DIAGNOSIS — M75121 Complete rotator cuff tear or rupture of right shoulder, not specified as traumatic: Secondary | ICD-10-CM | POA: Diagnosis not present

## 2018-01-17 DIAGNOSIS — G8929 Other chronic pain: Secondary | ICD-10-CM

## 2018-01-17 NOTE — Progress Notes (Signed)
The patient is well-known to me.  He is in continued follow-up for his right shoulder pain and weakness.  He has a known retracted rotator cuff tear with significant impingement of the shoulder as well.  He is tried and failed all forms conservative treatment at this point.  We last provided a steroid injection a month ago.  He has been through therapy as well.  He still having pain is giving him trouble sleeping at night.  Is having pain with overhead activities and weakness in the shoulder as well.  At this point having failed all conservative treatment measures he is asking about possible surgical intervention.  On examination of his right shoulder he has trouble with abduction and overhead motion of the shoulder.  There is weakness.  There is stiffness with internal rotation abduction.  All this is causing pain and there is definitely weakness as well.  We did go over the MRI again showing the extent of his shoulder issues on the right side.  I showed him a shoulder model explained in detail the function of the shoulder.  We talked about arthroscopic intervention of the shoulder with extensive debridement and subacromial decompression and a possible rotator cuff tear depending on the condition of the soft tissues in the shoulder and cuff in general.  He is a diabetic and working on better control.  His sugars did go up with the steroid injection.  At this point I do feel an arthroscopic intervention is warranted.  We had a long thorough discussion about the surgery.  I talked about his intraoperative and postoperative course as well as the risk and benefits of the surgery.  He would like to have this scheduled in the near future and will work on doing so.  We would then see him back in 1 week postoperative.  All question concerns were answered and addressed.

## 2018-01-26 DIAGNOSIS — M659 Synovitis and tenosynovitis, unspecified: Secondary | ICD-10-CM | POA: Diagnosis not present

## 2018-01-26 DIAGNOSIS — M75121 Complete rotator cuff tear or rupture of right shoulder, not specified as traumatic: Secondary | ICD-10-CM | POA: Diagnosis not present

## 2018-01-26 DIAGNOSIS — M19011 Primary osteoarthritis, right shoulder: Secondary | ICD-10-CM | POA: Diagnosis not present

## 2018-01-26 DIAGNOSIS — G8918 Other acute postprocedural pain: Secondary | ICD-10-CM | POA: Diagnosis not present

## 2018-02-02 ENCOUNTER — Other Ambulatory Visit (INDEPENDENT_AMBULATORY_CARE_PROVIDER_SITE_OTHER): Payer: Self-pay

## 2018-02-02 ENCOUNTER — Ambulatory Visit (INDEPENDENT_AMBULATORY_CARE_PROVIDER_SITE_OTHER): Payer: BC Managed Care – PPO | Admitting: Orthopaedic Surgery

## 2018-02-02 ENCOUNTER — Encounter (INDEPENDENT_AMBULATORY_CARE_PROVIDER_SITE_OTHER): Payer: Self-pay | Admitting: Orthopaedic Surgery

## 2018-02-02 DIAGNOSIS — Z9889 Other specified postprocedural states: Secondary | ICD-10-CM

## 2018-02-02 NOTE — Progress Notes (Signed)
The patient is one-week status post a right shoulder arthroscopy with an arthroscopically assisted rotator cuff repair.  He had significant inflamed tissue in the shoulder as well.  He is 67 years old.  He has been compliant with wearing a sling.  On exam I remove the sutures from his right shoulder.  His axillary nerve is functioning.  We will put him through gentle rotation of the shoulder and he does have pain but he seems to be doing well overall.  Distally his motor and sensory exam is in normal function.  I did share with him his arthroscopy pictures and explained in detail what the surgery was that we performed.  He understands that this will take extensive therapy to get better shoulder function and this can take 5 to 6 months to really recover from.  His initial now that we set him up for outpatient physical therapy to work on range of motion and eventually strengthening of his shoulder.  This can be done to the common system.  I will see him back in 4 weeks to see how is doing overall and hopefully he will have been through some therapy.  He will still wear his sling especially when he sleeps at night.

## 2018-02-07 ENCOUNTER — Ambulatory Visit: Payer: Medicare Other | Attending: Orthopaedic Surgery | Admitting: Physical Therapy

## 2018-02-07 ENCOUNTER — Encounter: Payer: Self-pay | Admitting: Physical Therapy

## 2018-02-07 DIAGNOSIS — M25611 Stiffness of right shoulder, not elsewhere classified: Secondary | ICD-10-CM | POA: Diagnosis not present

## 2018-02-07 DIAGNOSIS — M25511 Pain in right shoulder: Secondary | ICD-10-CM | POA: Insufficient documentation

## 2018-02-07 DIAGNOSIS — M6281 Muscle weakness (generalized): Secondary | ICD-10-CM | POA: Diagnosis not present

## 2018-02-07 DIAGNOSIS — R600 Localized edema: Secondary | ICD-10-CM | POA: Diagnosis not present

## 2018-02-07 DIAGNOSIS — G8929 Other chronic pain: Secondary | ICD-10-CM | POA: Diagnosis not present

## 2018-02-07 NOTE — Therapy (Signed)
[facility] [address] [place], [place], [address] Phone: [phone]   Fax:  [phone]  Physical Therapy Evaluation  Patient Details  Name: Caleb [name] MRN: [id number] Date of Birth: [birth year] Referring Provider: Dr. Magnus Ivan   Encounter Date: 02/07/2018  PT End of Session - 02/07/18 1259    Visit Number  1    Number of Visits  16    Date for PT Re-Evaluation  04/04/18    PT Start Time  1015    PT Stop Time  1100    PT Time Calculation (min)  45 min    Activity Tolerance  Patient tolerated treatment well    Behavior During Therapy  [facility] for tasks assessed/performed       Past Medical History:  Diagnosis Date  . High cholesterol   . Type II diabetes mellitus ([facility])     Past Surgical History:  Procedure Laterality Date  . LEFT HEART CATHETERIZATION WITH CORONARY ANGIOGRAM N/A 05/04/2013   Procedure: LEFT HEART CATHETERIZATION WITH CORONARY ANGIOGRAM;  Surgeon: Pamella Pert, MD;  Location: [facility] CATH LAB;  Service: Cardiovascular;  Laterality: N/A;  . NO PAST SURGERIES      There were no vitals filed for this visit.   Subjective Assessment - 02/07/18 1024    Subjective  Patient underwent Rt rotator cuff repair.  He is Rt handed.  He is having difficulty using Rt UE for ADLs, is unable to drive, general mobility.  Pain is in lateral shoulder, moderate at worst.  Wears sling.  Knows not to lift his arm.     Pertinent History  diabetes     Limitations  Lifting;House hold activities;Other (comment)    Currently in Pain?  No/denies    Pain Score  7  with certain movements    Pain Location  Shoulder    Pain Orientation  Right    Pain Descriptors / Indicators  Shooting;Sharp    Pain Type  Surgical pain    Pain Radiating Towards  upper arm and forearm     Pain Onset  1 to 4 weeks ago    Pain Frequency  Intermittent    Aggravating Factors   can be spontaneous, changing positions     Pain Relieving Factors   sling, meds, ice, sleep in recliner     Effect of Pain on Daily Activities  unable to volunteer, work, normal ADLs, sleep     Multiple Pain Sites  No         [facility] PT Assessment - 02/07/18 0001      Assessment   Medical Diagnosis  Rt shoulder  ([facility])     Referring Provider  Dr. Magnus Ivan    Onset Date/Surgical Date  01/26/18    Hand Dominance  Right    Next MD Visit  4 weeks     Prior Therapy  Yes       Precautions   Precautions  None      Restrictions   Weight Bearing Restrictions  No      Balance Screen   Has the patient fallen in the past 6 months  No      Home Environment   Living Environment  Private residence    Living Arrangements  Other relatives    Additional Comments  2 sisters       Prior Function   Level of Independence  Independent with basic ADLs;Needs assistance with homemaking    Leisure  Friends, family , very active  in church       Cognition   Overall Cognitive Status  Within Functional Limits for tasks assessed      Observation/Other Assessments   Focus on Therapeutic Outcomes (FOTO)   96%      Sensation   Light Touch  Appears Intact      PROM   Right Shoulder Flexion  90 Degrees    Right Shoulder ABduction  80 Degrees    Right Shoulder Internal Rotation  53 Degrees    Right Shoulder External Rotation  32 Degrees      Palpation   Palpation comment  Tenderness along well healed incision                Objective measurements completed on examination: See above findings.      [facility] Adult PT Treatment/Exercise - 02/07/18 0001      Self-Care   Other Self-Care Comments   see eval       Shoulder Exercises: ROM/Strengthening   Pendulum  A/p, lateral and circles, about 30 sec each with instruction.     Other ROM/Strengthening Exercises  flexion table slides, scap retraction       Cryotherapy   Number Minutes Cryotherapy  10 Minutes    Cryotherapy Location  Shoulder    Type of Cryotherapy  Ice pack             PT  Education - 02/07/18 1259    Education provided  Yes    Education Details  Protocol, HEP, precautions, RICE     Person(s) Educated  Patient    Methods  Explanation;Demonstration;Handout    Comprehension  Verbalized understanding       PT Short Term Goals - 02/07/18 1301      PT SHORT TERM GOAL #1   Title  Pt will be I with HEP for Rt UE ROM and strength within protocol limitations    Time  4    Period  Weeks    Status  New    Target Date  03/07/18      PT SHORT TERM GOAL #2   Title  Pt will be able to get dressed, complete ADLs/hygiene, with 25% less pain    Time  4    Period  Weeks    Status  New    Target Date  03/07/18      PT SHORT TERM GOAL #3   Title  Pt will be able to understand concepts of RICE and use for self care at home.     Time  4    Period  Weeks    Status  New    Target Date  03/07/18        PT Long Term Goals - 02/07/18 1302      PT LONG TERM GOAL #1   Title  Pt will score <50% impaired on FOTO to demo improved functional mobility.     Time  8    Period  Weeks    Status  New    Target Date  04/04/18      PT LONG TERM GOAL #2   Title  Pt will be able to raise R arm overhead with pain minimal (120 deg or more) for home tasks, washing the car     Time  8    Period  Weeks    Status  New    Target Date  04/04/18      PT LONG TERM GOAL #3   Title  Pt will be I with HEP upon discharge for Rt UE and posture    Time  8    Period  Weeks    Status  New    Target Date  04/04/18      PT LONG TERM GOAL #4   Title  Pt will be demo 4+/5 strength in Rt UE in all planes to return to part time work, volunteering.     Time  8    Period  Weeks    Status  New    Target Date  04/04/18      PT LONG TERM GOAL #5   Title  Pt will be able to chop, lift saucepan in the kitchen using Rt UE without pain.     Time  8    Period  Weeks    Status  New    Target Date  04/04/18      Additional Long Term Goals   Additional Long Term Goals  Yes      PT LONG TERM  GOAL #6   Title  Pt will be able to resume normal walking schedule , 30 min or more , 3 days per week with no increased shoulder discomfort     Time  8    Period  Weeks    Status  New    Target Date  04/04/18             Plan - 02/07/18 1304    Clinical Impression Statement  Patient presents for low complexity eval of R shoulder s/p arthroscopic repair done 01/26/18. Overall , he is having less pain and is pleased he had the surgery.  He was advised to refrain from any AROM of R UE.  He has min pain with PROM , limited to <90 deg flex, abd, did not push ER today.  He should do very well.     Clinical Presentation  Stable    Clinical Decision Making  Low    Rehab Potential  Excellent    PT Frequency  2x / week    PT Duration  8 weeks    PT Treatment/Interventions  ADLs/Self Care Home Management;Electrical Stimulation;Iontophoresis 4mg /ml Dexamethasone;Moist Heat;Cryotherapy;Ultrasound;Patient/family education;Therapeutic exercise;Therapeutic activities;Functional mobility training;Neuromuscular re-education;Taping;Passive range of motion;Manual techniques;Vasopneumatic Device    PT Next Visit Plan  check HEP, modalities for pain, PROM only     PT Home Exercise Plan  table slide flexion only and scapular retraction, pendulum, ball squeeze     Consulted and Agree with Plan of Care  Patient       Patient will benefit from skilled therapeutic intervention in order to improve the following deficits and impairments:  Decreased mobility, Hypomobility, Increased muscle spasms, Pain, Postural dysfunction, Impaired UE functional use, Impaired flexibility, Increased fascial restricitons, Decreased strength, Decreased range of motion, Decreased skin integrity, Increased edema  Visit Diagnosis: Muscle weakness (generalized)  Stiffness of right shoulder, not elsewhere classified  Acute pain of right shoulder  Localized edema     Problem List Patient Active Problem List   Diagnosis Date  Noted  . S/P right rotator cuff repair 02/02/2018  . Nontraumatic complete tear of right rotator cuff 11/15/2017  . Chronic right shoulder pain 11/15/2017  . Acute pain of right shoulder 10/18/2017    Joory Gough 02/07/2018, 1:10 PM  [facility] [address] [place], [place], [address] Phone: [phone]   Fax:  [phone]  Name: Caleb [name] MRN: [id number] Date of Birth: 02/17/1951   Victorino Dike  Kayman Snuffer, PT 02/07/18 1:16 PM Phone: [phone] Fax: [phone]

## 2018-02-07 NOTE — Patient Instructions (Signed)
ROM: Pendulum (Circular)  Let right arm move in circle clockwise, then counterclockwise, by rocking body weight in circular pattern. Circle __ times each direction per set. Do _3___ sessions per day. Try 1 min at a time   Pendulum Side to Side  Bend forward 90 at waist, leaning on table for support. Rock body from side to side and let arm swing freely. Repeat _10___ times. Do __3__ sessions per day. Try 1 min   Finger Flexors  Keeping right fingertips straight, press putty toward base of palm. Repeat __20__ times. Do __3__ sessions per day. Activity: Squeeze flour sifter, plastic squeeze bottles, [place] baster, juice from fruit.*  AROM: Elbow Flexion / Extension  With R  hand palm up, gently bend elbow as far as possible. Then straighten arm as far as possible. Repeat __10__ times per set. Do __3__ sessions per day.  SHOULDER: Flexion On Table  Place hands on table, elbows straight. Move hips away from body. Press hands down into table. Hold _3__ seconds. _10__ reps per set, __3_ sets per day.   Scapular Retraction (Standing)    With arms at sides, pinch shoulder blades together. Repeat __10__ times per set. Do ___1-2_ sets per session. Do ___2-3_ sessions per day.  http://orth.exer.us/944   Copyright  VHI. All rights reserved.   Posture - Sitting   Sit upright, head facing forward. Try using a roll to support lower back. Keep shoulders relaxed, and avoid rounded back. Keep hips level with knees. Avoid crossing legs for long periods.  Copyright  VHI. All rights reserved.   Cryotherapy  ICE 20 mins at most, 3 times a day following exercises.   Cryotherapy means treatment with cold. Ice or gel packs can be used to reduce both pain and swelling. Ice is the most helpful within the first 24 to 48 hours after an injury or flare-up from overusing a muscle or joint. Sprains, strains, spasms, burning pain, shooting pain, and aches can all be eased with ice. Ice can also be  used when recovering from surgery. Ice is effective, has very few side effects, and is safe for most people to use. PRECAUTIONS  Ice is not a safe treatment option for people with:  Raynaud phenomenon. This is a condition affecting small blood vessels in the extremities. Exposure to cold may cause your problems to return.  Cold hypersensitivity. There are many forms of cold hypersensitivity, including:  Cold urticaria. Red, itchy hives appear on the skin when the tissues begin to warm after being iced.  Cold erythema. This is a red, itchy rash caused by exposure to cold.  Cold hemoglobinuria. Red blood cells break down when the tissues begin to warm after being iced. The hemoglobin that carry oxygen are passed into the urine because they cannot combine with blood proteins fast enough.  Numbness or altered sensitivity in the area being iced. If you have any of the following conditions, do not use ice until you have discussed cryotherapy with your caregiver:  Heart conditions, such as arrhythmia, angina, or chronic heart disease.  High blood pressure.  Healing wounds or open skin in the area being iced.  Current infections.  Rheumatoid arthritis.  Poor circulation.  Diabetes. Ice slows the blood flow in the region it is applied. This is beneficial when trying to stop inflamed tissues from spreading irritating chemicals to surrounding tissues. However, if you expose your skin to cold temperatures for too long or without the proper protection, you can damage your skin or nerves.  Watch for signs of skin damage due to cold. HOME CARE INSTRUCTIONS Follow these tips to use ice and cold packs safely.  Place a dry or damp towel between the ice and skin. A damp towel will cool the skin more quickly, so you may need to shorten the time that the ice is used.  For a more rapid response, add gentle compression to the ice.  Ice for no more than 20 minutes at a time. The bonier the area you are  icing, the less time it will take to get the benefits of ice.  Check your skin after 5 minutes to make sure there are no signs of a poor response to cold or skin damage.  Rest 20 minutes or more between uses.  Once your skin is numb, you can end your treatment. You can test numbness by very lightly touching your skin. The touch should be so light that you do not see the skin dimple from the pressure of your fingertip. When using ice, most people will feel these normal sensations in this order: cold, burning, aching, and numbness.  Do not use ice on someone who cannot communicate their responses to pain, such as small children or people with dementia. HOW TO MAKE AN ICE PACK Ice packs are the most common way to use ice therapy. Other methods include ice massage, ice baths, and cryosprays. Muscle creams that cause a cold, tingly feeling do not offer the same benefits that ice offers and should not be used as a substitute unless recommended by your caregiver. To make an ice pack, do one of the following:  Place crushed ice or a bag of frozen vegetables in a sealable plastic bag. Squeeze out the excess air. Place this bag inside another plastic bag. Slide the bag into a pillowcase or place a damp towel between your skin and the bag.  Mix 3 parts water with 1 part rubbing alcohol. Freeze the mixture in a sealable plastic bag. When you remove the mixture from the freezer, it will be slushy. Squeeze out the excess air. Place this bag inside another plastic bag. Slide the bag into a pillowcase or place a damp towel between your skin and the bag. SEEK MEDICAL CARE IF:  You develop white spots on your skin. This may give the skin a blotchy (mottled) appearance.  Your skin turns blue or pale.  Your skin becomes waxy or hard.  Your swelling gets worse. MAKE SURE YOU:   Understand these instructions.  Will watch your condition.  Will get help right away if you are not doing well or get  worse. Document Released: 02/22/2011 Document Revised: 11/12/2013 Document Reviewed: 02/22/2011 [facility] Patient Information 2015 [place], [place]. This information is not intended to replace advice given to you by your health care provider. Make sure you discuss any questions you have with your health care provider.

## 2018-02-08 ENCOUNTER — Ambulatory Visit: Payer: Medicare Other | Admitting: Physical Therapy

## 2018-02-08 DIAGNOSIS — G8929 Other chronic pain: Secondary | ICD-10-CM | POA: Diagnosis not present

## 2018-02-08 DIAGNOSIS — M25611 Stiffness of right shoulder, not elsewhere classified: Secondary | ICD-10-CM

## 2018-02-08 DIAGNOSIS — M25511 Pain in right shoulder: Secondary | ICD-10-CM

## 2018-02-08 DIAGNOSIS — M6281 Muscle weakness (generalized): Secondary | ICD-10-CM | POA: Diagnosis not present

## 2018-02-08 DIAGNOSIS — R600 Localized edema: Secondary | ICD-10-CM | POA: Diagnosis not present

## 2018-02-08 NOTE — Therapy (Signed)
[facility] [address] [place], [place], [address] Phone: [phone]   Fax:  [phone]  Physical Therapy Treatment  Patient Details  Name: Caleb RamsClifton [name] MRN: [id number] Date of Birth: [birth year] Referring Provider: Dr. Magnus IvanBlackman   Encounter Date: 02/08/2018  PT End of Session - 02/08/18 1224    Visit Number  2    Number of Visits  16    Date for PT Re-Evaluation  04/04/18    PT Start Time  1143    PT Stop Time  1236    PT Time Calculation (min)  53 min    Activity Tolerance  Patient tolerated treatment well    Behavior During Therapy  [facility] for tasks assessed/performed       Past Medical History:  Diagnosis Date  . High cholesterol   . Type II diabetes mellitus (HCC)     Past Surgical History:  Procedure Laterality Date  . LEFT HEART CATHETERIZATION WITH CORONARY ANGIOGRAM N/A 05/04/2013   Procedure: LEFT HEART CATHETERIZATION WITH CORONARY ANGIOGRAM;  Surgeon: Pamella PertJagadeesh R Ganji, MD;  [facility];  Service: Cardiovascular;  Laterality: N/A;  . NO PAST SURGERIES      There were no vitals filed for this visit.  Subjective Assessment - 02/08/18 1156    Subjective  Had to take a pain pill this AM.      Currently in Pain?  Yes    Pain Score  4     Pain Location  Shoulder    Pain Orientation  Right          [facility] Adult PT Treatment/Exercise - 02/08/18 0001      Shoulder Exercises: Seated   Other Seated Exercises  scapular retraction x 10       Shoulder Exercises: ROM/Strengthening   Pendulum  A/p, lateral and circles, about 30 sec each with instruction.  x 3 , 30 sec       Shoulder Exercises: Stretch   Table Stretch - Flexion  10 seconds x 10     Other Shoulder Stretches  PROM with PT guiding Rt UE into flexion, scaption, ER/IR and circles seated with UE ranger       Vasopneumatic   Number Minutes Vasopneumatic   15 minutes    Vasopnuematic Location   Shoulder    Vasopneumatic Pressure   Medium    Vasopneumatic Temperature   coldest       Manual Therapy   Manual Therapy  Joint mobilization    Manual therapy comments  gentle    Joint Mobilization  Gr 1 inferior glides    Soft tissue mobilization  deltoid, ant shoulder    Passive ROM  all planes Rt shoulder to tolerance  elbow extension and supination              PT Education - 02/07/18 1259    Education provided  Yes    Education Details  Protocol, HEP, precautions, RICE     Person(s) Educated  Patient    Methods  Explanation;Demonstration;Handout    Comprehension  Verbalized understanding       PT Short Term Goals - 02/07/18 1301      PT SHORT TERM GOAL #1   Title  Pt will be I with HEP for Rt UE ROM and strength within protocol limitations    Time  4    Period  Weeks    Status  New    Target Date  03/07/18  PT SHORT TERM GOAL #2   Title  Pt will be able to get dressed, complete ADLs/hygiene, with 25% less pain    Time  4    Period  Weeks    Status  New    Target Date  03/07/18      PT SHORT TERM GOAL #3   Title  Pt will be able to understand concepts of RICE and use for self care at home.     Time  4    Period  Weeks    Status  New    Target Date  03/07/18        PT Long Term Goals - 02/07/18 1302      PT LONG TERM GOAL #1   Title  Pt will score <50% impaired on FOTO to demo improved functional mobility.     Time  8    Period  Weeks    Status  New    Target Date  04/04/18      PT LONG TERM GOAL #2   Title  Pt will be able to raise R arm overhead with pain minimal (120 deg or more) for home tasks, washing the car     Time  8    Period  Weeks    Status  New    Target Date  04/04/18      PT LONG TERM GOAL #3   Title  Pt will be I with HEP upon discharge for Rt UE and posture    Time  8    Period  Weeks    Status  New    Target Date  04/04/18      PT LONG TERM GOAL #4   Title  Pt will be demo 4+/5 strength in Rt UE in all planes to return to part time work, volunteering.      Time  8    Period  Weeks    Status  New    Target Date  04/04/18      PT LONG TERM GOAL #5   Title  Pt will be able to chop, lift saucepan in the kitchen using Rt UE without pain.     Time  8    Period  Weeks    Status  New    Target Date  04/04/18      Additional Long Term Goals   Additional Long Term Goals  Yes      PT LONG TERM GOAL #6   Title  Pt will be able to resume normal walking schedule , 30 min or more , 3 days per week with no increased shoulder discomfort     Time  8    Period  Weeks    Status  New    Target Date  04/04/18            Plan - 02/08/18 1224    Clinical Impression Statement  1st post surgical treatment today.  Pain more than yesterday, focused on gentle PROM and mobility, pain control.     PT Treatment/Interventions  ADLs/Self Care Home Management;Electrical Stimulation;Iontophoresis 4mg /ml Dexamethasone;Moist Heat;Cryotherapy;Ultrasound;Patient/family education;Therapeutic exercise;Therapeutic activities;Functional mobility training;Neuromuscular re-education;Taping;Passive range of motion;Manual techniques;Vasopneumatic Device    PT Next Visit Plan  check HEP, modalities for pain, PROM only     PT Home Exercise Plan  table slide flexion only and scapular retraction, pendulum, ball squeeze     Consulted and Agree with Plan of Care  Patient       Patient  will benefit from skilled therapeutic intervention in order to improve the following deficits and impairments:  Decreased mobility, Hypomobility, Increased muscle spasms, Pain, Postural dysfunction, Impaired UE functional use, Impaired flexibility, Increased fascial restricitons, Decreased strength, Decreased range of motion, Decreased skin integrity, Increased edema  Visit Diagnosis: Muscle weakness (generalized)  Stiffness of right shoulder, not elsewhere classified  Acute pain of right shoulder  Localized edema  Chronic right shoulder pain     Problem List Patient Active Problem  List   Diagnosis Date Noted  . S/P right rotator cuff repair 02/02/2018  . Nontraumatic complete tear of right rotator cuff 11/15/2017  . Chronic right shoulder pain 11/15/2017  . Acute pain of right shoulder 10/18/2017    Francisco Eyerly 02/08/2018, 12:26 PM  [facility] [address] [place], [place], [address] Phone: [phone]   Fax:  [phone]  Name: Memphis Decoteau MRN: [id number] Date of Birth: 03/14/1951  Karie Mainland, PT 02/08/18 12:26 PM Phone: [phone] Fax: [phone]

## 2018-02-16 ENCOUNTER — Ambulatory Visit: Payer: Medicare Other | Attending: Orthopaedic Surgery | Admitting: Physical Therapy

## 2018-02-16 ENCOUNTER — Encounter: Payer: Self-pay | Admitting: Physical Therapy

## 2018-02-16 DIAGNOSIS — M25511 Pain in right shoulder: Secondary | ICD-10-CM | POA: Insufficient documentation

## 2018-02-16 DIAGNOSIS — M25611 Stiffness of right shoulder, not elsewhere classified: Secondary | ICD-10-CM | POA: Diagnosis not present

## 2018-02-16 DIAGNOSIS — R600 Localized edema: Secondary | ICD-10-CM | POA: Diagnosis not present

## 2018-02-16 DIAGNOSIS — G8929 Other chronic pain: Secondary | ICD-10-CM | POA: Diagnosis not present

## 2018-02-16 DIAGNOSIS — M6281 Muscle weakness (generalized): Secondary | ICD-10-CM | POA: Diagnosis not present

## 2018-02-16 NOTE — Therapy (Signed)
[facility] [address] [place], [place], [address] Phone: [phone]   Fax:  [phone]  Physical Therapy Treatment  Patient Details  Name: Caleb [name] MRN: [id number] Date of Birth: [birth year] Referring Provider: Dr. Magnus Ivan   Encounter Date: 02/16/2018  PT End of Session - 02/16/18 1022    Visit Number  3    Number of Visits  16    Date for PT Re-Evaluation  04/04/18    PT Start Time  0931    PT Stop Time  1023    PT Time Calculation (min)  52 min    Activity Tolerance  Patient tolerated treatment well    Behavior During Therapy  [facility] for tasks assessed/performed       Past Medical History:  Diagnosis Date  . High cholesterol   . Type II diabetes mellitus (HCC)     Past Surgical History:  Procedure Laterality Date  . LEFT HEART CATHETERIZATION WITH CORONARY ANGIOGRAM N/A 05/04/2013   Procedure: LEFT HEART CATHETERIZATION WITH CORONARY ANGIOGRAM;  Surgeon: Pamella Pert, MD;  Location: [facility] CATH LAB;  Service: Cardiovascular;  Laterality: N/A;  . NO PAST SURGERIES      There were no vitals filed for this visit.  Subjective Assessment - 02/16/18 0930    Subjective  Hurts from time to time.  Doing exercises as best he can. Sees MD in a couple weeks .  Hopes to go to beach with family in a couple weeks     Currently in Pain?  No/denies          [facility] Adult PT Treatment/Exercise - 02/16/18 0001      Self-Care   ADL's  putty     Other Self-Care Comments   Ask MD about Voltaren, PROM vs AAROM, positioning       Shoulder Exercises: Supine   External Rotation  PROM;Right;10 reps    Other Supine Exercises  biceps flexion and extension x 10, yellow      Shoulder Exercises: Seated   Retraction  Strengthening;Both;10 reps    Horizontal ABduction  PROM;Right;10 reps    Flexion  PROM;Right;10 reps    Abduction  PROM;Right;10 reps    Other Seated Exercises  scapular retraction x 10     Other Seated  Exercises  ER gentle 10 sec hold x 10       Shoulder Exercises: ROM/Strengthening   Pendulum  all planes about 1 min each , including circles      Hand Exercises   Other Hand Exercises  putty yelow for home use       Cryotherapy   Number Minutes Cryotherapy  5 Minutes    Cryotherapy Location  Shoulder    Type of Cryotherapy  Ice pack      Ultrasound   Ultrasound Location  Rt post lateral shoulder     Ultrasound Parameters  100%, 1.2 W/cm2, 7 min     Ultrasound Goals  Pain      Manual Therapy   Manual Therapy  Joint mobilization    Manual therapy comments  gentle    Joint Mobilization  Gr 1 inferior glides    Passive ROM  all planes Rt shoulder to tolerance              PT Education - 02/16/18 1022    Education provided  Yes    Education Details  see flowsheet     Person(s) Educated  Patient    Methods  Explanation  Comprehension  Verbalized understanding                 Plan - 02/16/18 1022    Clinical Impression Statement  Doing well, talked about sleep positioning and tips to avoid increased pain.  Pain today with PROM 90 deg flex and abd     PT Treatment/Interventions  ADLs/Self Care Home Management;Electrical Stimulation;Iontophoresis 4mg /ml Dexamethasone;Moist Heat;Cryotherapy;Ultrasound;Patient/family education;Therapeutic exercise;Therapeutic activities;Functional mobility training;Neuromuscular re-education;Taping;Passive range of motion;Manual techniques;Vasopneumatic Device    PT Next Visit Plan  check HEP, modalities for pain, PROM only     PT Home Exercise Plan  table slide flexion only and scapular retraction, pendulum, ball squeeze , putty     Consulted and Agree with Plan of Care  Patient       Patient will benefit from skilled therapeutic intervention in order to improve the following deficits and impairments:  Decreased mobility, Hypomobility, Increased muscle spasms, Pain, Postural dysfunction, Impaired UE functional use, Impaired  flexibility, Increased fascial restricitons, Decreased strength, Decreased range of motion, Decreased skin integrity, Increased edema  Visit Diagnosis: Muscle weakness (generalized)  Stiffness of right shoulder, not elsewhere classified  Acute pain of right shoulder  Localized edema  Chronic right shoulder pain     Problem List Patient Active Problem List   Diagnosis Date Noted  . S/P right rotator cuff repair 02/02/2018  . Nontraumatic complete tear of right rotator cuff 11/15/2017  . Chronic right shoulder pain 11/15/2017  . Acute pain of right shoulder 10/18/2017    Raschelle Wisenbaker 02/16/2018, 10:53 AM  [facility] [address] [place], [place], [address] Phone: [phone]   Fax:  [phone]  Name: Caleb [name] MRN: [id number] Date of Birth: [birth year]  Karie MainlandJennifer Breylen Agyeman, PT 02/16/18 10:53 AM Phone: [phone] Fax: [phone]

## 2018-02-21 ENCOUNTER — Ambulatory Visit: Payer: Medicare Other | Admitting: Physical Therapy

## 2018-02-21 ENCOUNTER — Encounter: Payer: Self-pay | Admitting: Physical Therapy

## 2018-02-21 DIAGNOSIS — R600 Localized edema: Secondary | ICD-10-CM

## 2018-02-21 DIAGNOSIS — M25511 Pain in right shoulder: Secondary | ICD-10-CM

## 2018-02-21 DIAGNOSIS — M6281 Muscle weakness (generalized): Secondary | ICD-10-CM | POA: Diagnosis not present

## 2018-02-21 DIAGNOSIS — M25611 Stiffness of right shoulder, not elsewhere classified: Secondary | ICD-10-CM

## 2018-02-21 NOTE — Therapy (Signed)
[facility] [address] [place], [place], [address] Phone: [phone]   Fax:  [phone]  Physical Therapy Treatment  Patient Details  Name: Caleb [name] MRN: [id number] Date of Birth: 31-Jan-1951 Referring Provider: Dr. Magnus Ivan   Encounter Date: 02/21/2018  PT End of Session - 02/21/18 0840    Visit Number  4    Number of Visits  16    Date for PT Re-Evaluation  04/04/18    PT Start Time  0845    PT Stop Time  0935    PT Time Calculation (min)  50 min    Activity Tolerance  Patient tolerated treatment well    Behavior During Therapy  [facility] for tasks assessed/performed       Past Medical History:  Diagnosis Date  . High cholesterol   . Type II diabetes mellitus (HCC)     Past Surgical History:  Procedure Laterality Date  . LEFT HEART CATHETERIZATION WITH CORONARY ANGIOGRAM N/A 05/04/2013   Procedure: LEFT HEART CATHETERIZATION WITH CORONARY ANGIOGRAM;  Surgeon: Pamella Pert, MD;  Location: [facility] CATH LAB;  Service: Cardiovascular;  Laterality: N/A;  . NO PAST SURGERIES      There were no vitals filed for this visit.  Subjective Assessment - 02/21/18 0848    Subjective  I'm in no pain right now .  Just aggravated with how hard it is to sleep.  Brought in the surgical pictures for you to see.     Currently in Pain?  No/denies         [facility] PT Assessment - 02/21/18 0001      PROM   Right Shoulder Flexion  110 Degrees    Right Shoulder ABduction  100 Degrees    Right Shoulder External Rotation  45 Degrees         [facility] Adult PT Treatment/Exercise - 02/21/18 0001      Cryotherapy   Number Minutes Cryotherapy  15 Minutes    Cryotherapy Location  Shoulder    Type of Cryotherapy  Ice pack      Electrical Stimulation   Electrical Stimulation Location  Rt Shoulder    Electrical Stimulation Action  IFC     Electrical Stimulation Parameters  to tol (17)     Electrical Stimulation Goals  Pain       Manual Therapy   Manual Therapy  Joint mobilization;Scapular mobilization    Manual therapy comments  gentle    Joint Mobilization  Gentle distraction, Gr 1 inferior glides    Soft tissue mobilization  posterior cuff     Scapular Mobilization  pulled scapula frpom ribcage, upward and downward rotation, elevation and depression     Passive ROM  Rt Elbow extension, all planes Rt shoulder to tolerance    elbow extension and supination            PT Education - 02/21/18 0922    Education provided  Yes    Education Details  progress, sling     Person(s) Educated  Patient    Methods  Explanation    Comprehension  Verbalized understanding       PT Short Term Goals - 02/21/18 0927      PT SHORT TERM GOAL #1   Title  Pt will be I with HEP for Rt UE ROM and strength within protocol limitations    Status  Achieved      PT SHORT TERM GOAL #2   Title  Pt will be able  to get dressed, complete ADLs/hygiene, with 25% less pain    Status  On-going      PT SHORT TERM GOAL #3   Title  Pt will be able to understand concepts of RICE and use for self care at home.     Status  Achieved        PT Long Term Goals - 02/07/18 1302      PT LONG TERM GOAL #1   Title  Pt will score <50% impaired on FOTO to demo improved functional mobility.     Time  8    Period  Weeks    Status  New    Target Date  04/04/18      PT LONG TERM GOAL #2   Title  Pt will be able to raise R arm overhead with pain minimal (120 deg or more) for home tasks, washing the car     Time  8    Period  Weeks    Status  New    Target Date  04/04/18      PT LONG TERM GOAL #3   Title  Pt will be I with HEP upon discharge for Rt UE and posture    Time  8    Period  Weeks    Status  New    Target Date  04/04/18      PT LONG TERM GOAL #4   Title  Pt will be demo 4+/5 strength in Rt UE in all planes to return to part time work, volunteering.     Time  8    Period  Weeks    Status  New    Target Date  04/04/18       PT LONG TERM GOAL #5   Title  Pt will be able to chop, lift saucepan in the kitchen using Rt UE without pain.     Time  8    Period  Weeks    Status  New    Target Date  04/04/18      Additional Long Term Goals   Additional Long Term Goals  Yes      PT LONG TERM GOAL #6   Title  Pt will be able to resume normal walking schedule , 30 min or more , 3 days per week with no increased shoulder discomfort     Time  8    Period  Weeks    Status  New    Target Date  04/04/18            Plan - 02/21/18 0922    Clinical Impression Statement  Improved tolerance for PROM, stretching.  Able to get to approx. 110 deg flexion with moderate pain.     PT Treatment/Interventions  ADLs/Self Care Home Management;Electrical Stimulation;Iontophoresis 4mg /ml Dexamethasone;Moist Heat;Cryotherapy;Ultrasound;Patient/family education;Therapeutic exercise;Therapeutic activities;Functional mobility training;Neuromuscular re-education;Taping;Passive range of motion;Manual techniques;Vasopneumatic Device    PT Next Visit Plan  check HEP, modalities for pain, PROM only     PT Home Exercise Plan  table slide flexion only and scapular retraction, pendulum, ball squeeze , putty     Consulted and Agree with Plan of Care  Patient       Patient will benefit from skilled therapeutic intervention in order to improve the following deficits and impairments:  Decreased mobility, Hypomobility, Increased muscle spasms, Pain, Postural dysfunction, Impaired UE functional use, Impaired flexibility, Increased fascial restricitons, Decreased strength, Decreased range of motion, Decreased skin integrity, Increased edema  Visit Diagnosis: Muscle weakness (  generalized)  Stiffness of right shoulder, not elsewhere classified  Acute pain of right shoulder  Localized edema     Problem List Patient Active Problem List   Diagnosis Date Noted  . S/P right rotator cuff repair 02/02/2018  . Nontraumatic complete tear of  right rotator cuff 11/15/2017  . Chronic right shoulder pain 11/15/2017  . Acute pain of right shoulder 10/18/2017    Shalin Linders 02/21/2018, 9:27 AM  [facility] [address] [place], [place], [address] Phone: [phone]   Fax:  [phone]  Name: Legrand RamsClifton Cadogan MRN: [id number] Date of Birth: Nov 08, 1950  Karie MainlandJennifer Breyden Jeudy, PT 02/21/18 9:27 AM Phone: [phone] Fax: [phone]

## 2018-02-23 ENCOUNTER — Encounter: Payer: Self-pay | Admitting: Physical Therapy

## 2018-02-23 ENCOUNTER — Ambulatory Visit: Payer: Medicare Other | Admitting: Physical Therapy

## 2018-02-23 DIAGNOSIS — M6281 Muscle weakness (generalized): Secondary | ICD-10-CM | POA: Diagnosis not present

## 2018-02-23 DIAGNOSIS — R600 Localized edema: Secondary | ICD-10-CM

## 2018-02-23 DIAGNOSIS — M25611 Stiffness of right shoulder, not elsewhere classified: Secondary | ICD-10-CM

## 2018-02-23 DIAGNOSIS — G8929 Other chronic pain: Secondary | ICD-10-CM

## 2018-02-23 DIAGNOSIS — M25511 Pain in right shoulder: Secondary | ICD-10-CM

## 2018-02-23 NOTE — Therapy (Signed)
[facility] [address] [place], [place], [address] Phone: [phone]   Fax:  [phone]  Physical Therapy Treatment  Patient Details  Name: Caleb [name] MRN: [id number] Date of Birth: Jan 18, 1951 Referring Provider: Dr. Magnus IvanBlackman   Encounter Date: 02/23/2018  PT End of Session - 02/23/18 1205    Visit Number  5    Number of Visits  16    Date for PT Re-Evaluation  04/04/18    PT Start Time  1015    PT Stop Time  1106    PT Time Calculation (min)  51 min    Activity Tolerance  Patient tolerated treatment well    Behavior During Therapy  [facility] for tasks assessed/performed       Past Medical History:  Diagnosis Date  . High cholesterol   . Type II diabetes mellitus (HCC)     Past Surgical History:  Procedure Laterality Date  . LEFT HEART CATHETERIZATION WITH CORONARY ANGIOGRAM N/A 05/04/2013   Procedure: LEFT HEART CATHETERIZATION WITH CORONARY ANGIOGRAM;  Surgeon: Pamella PertJagadeesh R Ganji, MD;  [facility];  Service: Cardiovascular;  Laterality: N/A;  . NO PAST SURGERIES      There were no vitals filed for this visit.  Subjective Assessment - 02/23/18 1022    Subjective  I just can't sleep with this shoulder.  Takes an oxy.  4 weeks out today.     Currently in Pain?  No/denies   none at rest    Pain Location  Shoulder    Pain Orientation  Right           [facility] Adult PT Treatment/Exercise - 02/23/18 0001      Shoulder Exercises: Supine   Protraction Weight (lbs)  retraction x 10     External Rotation  AAROM;Right;15 reps    Flexion  AAROM;Right;12 reps    Shoulder Flexion Weight (lbs)  chest press UE ranger with PT assist       Shoulder Exercises: Seated   Horizontal ABduction  AAROM;Right;10 reps    External Rotation  AAROM;Right;10 reps    Flexion  AAROM;Right;15 reps    Diagonals Limitations  circles below shoulder height x 30 sec     Other Seated Exercises  AAROM scaption x 15     Other  Seated Exercises  UE ranger seated       Shoulder Exercises: ROM/Strengthening   Pendulum  A/P, M/L 30 sec each       Electrical Stimulation   Electrical Stimulation Location  Rt Shoulder    Electrical Stimulation Action  IFC    Electrical Stimulation Parameters  22    Electrical Stimulation Goals  Pain      Manual Therapy   Manual Therapy  Joint mobilization;Scapular mobilization    Manual therapy comments  gentle    Joint Mobilization  Gentle distraction, Gr 1 inferior glides    Passive ROM  all planes to tolerance              PT Education - 02/23/18 1205    Education provided  Yes    Education Details  AAROM gentle     Person(s) Educated  Patient    Methods  Explanation    Comprehension  Verbalized understanding       PT Short Term Goals - 02/21/18 0927      PT SHORT TERM GOAL #1   Title  Pt will be I with HEP for Rt UE ROM and strength within protocol  limitations    Status  Achieved      PT SHORT TERM GOAL #2   Title  Pt will be able to get dressed, complete ADLs/hygiene, with 25% less pain    Status  On-going      PT SHORT TERM GOAL #3   Title  Pt will be able to understand concepts of RICE and use for self care at home.     Status  Achieved        PT Long Term Goals - 02/07/18 1302      PT LONG TERM GOAL #1   Title  Pt will score <50% impaired on FOTO to demo improved functional mobility.     Time  8    Period  Weeks    Status  New    Target Date  04/04/18      PT LONG TERM GOAL #2   Title  Pt will be able to raise R arm overhead with pain minimal (120 deg or more) for home tasks, washing the car     Time  8    Period  Weeks    Status  New    Target Date  04/04/18      PT LONG TERM GOAL #3   Title  Pt will be I with HEP upon discharge for Rt UE and posture    Time  8    Period  Weeks    Status  New    Target Date  04/04/18      PT LONG TERM GOAL #4   Title  Pt will be demo 4+/5 strength in Rt UE in all planes to return to part time  work, volunteering.     Time  8    Period  Weeks    Status  New    Target Date  04/04/18      PT LONG TERM GOAL #5   Title  Pt will be able to chop, lift saucepan in the kitchen using Rt UE without pain.     Time  8    Period  Weeks    Status  New    Target Date  04/04/18      Additional Long Term Goals   Additional Long Term Goals  Yes      PT LONG TERM GOAL #6   Title  Pt will be able to resume normal walking schedule , 30 min or more , 3 days per week with no increased shoulder discomfort     Time  8    Period  Weeks    Status  New    Target Date  04/04/18            Plan - 02/23/18 1207    Clinical Impression Statement  Pt is 4 weeks post surgery today.  Began very gentle AAROM today, painful abduction and flexion passively, needed A to perform chest press .      PT Treatment/Interventions  ADLs/Self Care Home Management;Electrical Stimulation;Iontophoresis 4mg /ml Dexamethasone;Moist Heat;Cryotherapy;Ultrasound;Patient/family education;Therapeutic exercise;Therapeutic activities;Functional mobility training;Neuromuscular re-education;Taping;Passive range of motion;Manual techniques;Vasopneumatic Device    PT Next Visit Plan  light AAROM, modalities    PT Home Exercise Plan  table slide flexion only and scapular retraction, pendulum, ball squeeze , putty     Consulted and Agree with Plan of Care  Patient       Patient will benefit from skilled therapeutic intervention in order to improve the following deficits and impairments:  Decreased mobility, Hypomobility, Increased muscle  spasms, Pain, Postural dysfunction, Impaired UE functional use, Impaired flexibility, Increased fascial restricitons, Decreased strength, Decreased range of motion, Decreased skin integrity, Increased edema  Visit Diagnosis: Muscle weakness (generalized)  Stiffness of right shoulder, not elsewhere classified  Acute pain of right shoulder  Localized edema  Chronic right shoulder  pain     Problem List Patient Active Problem List   Diagnosis Date Noted  . S/P right rotator cuff repair 02/02/2018  . Nontraumatic complete tear of right rotator cuff 11/15/2017  . Chronic right shoulder pain 11/15/2017  . Acute pain of right shoulder 10/18/2017    PAA,JENNIFER 02/23/2018, 12:42 PM  [facility] [address] [place], [place], [address] Phone: [phone]   Fax:  [phone]  Name: Caleb [name] MRN: [id number] Date of Birth: 01/17/1951  Karie Mainland, PT 02/23/18 12:42 PM Phone: [phone] Fax: [phone]

## 2018-02-28 ENCOUNTER — Ambulatory Visit: Payer: Medicare Other | Admitting: Physical Therapy

## 2018-02-28 ENCOUNTER — Encounter: Payer: Self-pay | Admitting: Physical Therapy

## 2018-02-28 DIAGNOSIS — M25611 Stiffness of right shoulder, not elsewhere classified: Secondary | ICD-10-CM

## 2018-02-28 DIAGNOSIS — M6281 Muscle weakness (generalized): Secondary | ICD-10-CM

## 2018-02-28 DIAGNOSIS — G8929 Other chronic pain: Secondary | ICD-10-CM

## 2018-02-28 DIAGNOSIS — R600 Localized edema: Secondary | ICD-10-CM

## 2018-02-28 DIAGNOSIS — M25511 Pain in right shoulder: Secondary | ICD-10-CM

## 2018-02-28 NOTE — Therapy (Signed)
[facility] [address] [place], [place], [address] Phone: [phone]   Fax:  [phone]  Physical Therapy Treatment  Patient Details  Name: Caleb [name] MRN: [id number] Date of Birth: [birth year] Referring Provider: Dr. Magnus Ivan   Encounter Date: 02/28/2018  PT End of Session - 02/28/18 0934    Visit Number  6    Number of Visits  16    Date for PT Re-Evaluation  04/04/18    PT Start Time  0932    PT Stop Time  1035    PT Time Calculation (min)  63 min    Activity Tolerance  Patient tolerated treatment well    Behavior During Therapy  [facility] for tasks assessed/performed       Past Medical History:  Diagnosis Date  . High cholesterol   . Type II diabetes mellitus (HCC)     Past Surgical History:  Procedure Laterality Date  . LEFT HEART CATHETERIZATION WITH CORONARY ANGIOGRAM N/A 05/04/2013   Procedure: LEFT HEART CATHETERIZATION WITH CORONARY ANGIOGRAM;  Surgeon: Pamella Pert, MD;  Location: [facility];  Service: Cardiovascular;  Laterality: N/A;  . NO PAST SURGERIES      There were no vitals filed for this visit.  Subjective Assessment - 02/28/18 0932    Subjective  Its coming along. No pain right now. Comes again tomorrow before MD appt     Currently in Pain?  No/denies           [facility] Adult PT Treatment/Exercise - 02/28/18 0001      Shoulder Exercises: Supine   Flexion  AAROM;Right;12 reps    Shoulder Flexion Weight (lbs)  cane       Shoulder Exercises: Seated   Flexion  AAROM;Right;15 reps   UE Ranger standing   Abduction  --   too painful in standing    Other Seated Exercises  UE ranger seated scaption, circles, and horizontal add/abd for about 45 sec each       Shoulder Exercises: Pulleys   Flexion  3 minutes      Shoulder Exercises: Isometric Strengthening   Flexion  5X5"    Extension  5X5"    External Rotation  5X5"    Internal Rotation  5X5"    ABduction  5X5"    ADduction   5X5"    Other Isometric Exercises  PT assisting to ensure 30% effort       Cryotherapy   Number Minutes Cryotherapy  15 Minutes    Cryotherapy Location  Shoulder    Type of Cryotherapy  Ice pack      Electrical Stimulation   Electrical Stimulation Location  Rt Shoulder    Electrical Stimulation Action  IFC    Electrical Stimulation Parameters  18    Electrical Stimulation Goals  Pain      Manual Therapy   Manual Therapy  Joint mobilization;Scapular mobilization    Manual therapy comments  gentle, isometrics at neutral     Joint Mobilization  Gentle distraction, Gr 1 inferior glides    Soft tissue mobilization  ant and middle deltoid, pec release     Passive ROM  all planes to tolerance                PT Short Term Goals - 02/21/18 6962      PT SHORT TERM GOAL #1   Title  Pt will be I with HEP for Rt UE ROM and strength within protocol limitations  Status  Achieved      PT SHORT TERM GOAL #2   Title  Pt will be able to get dressed, complete ADLs/hygiene, with 25% less pain    Status  On-going      PT SHORT TERM GOAL #3   Title  Pt will be able to understand concepts of RICE and use for self care at home.     Status  Achieved        PT Long Term Goals - 02/07/18 1302      PT LONG TERM GOAL #1   Title  Pt will score <50% impaired on FOTO to demo improved functional mobility.     Time  8    Period  Weeks    Status  New    Target Date  04/04/18      PT LONG TERM GOAL #2   Title  Pt will be able to raise R arm overhead with pain minimal (120 deg or more) for home tasks, washing the car     Time  8    Period  Weeks    Status  New    Target Date  04/04/18      PT LONG TERM GOAL #3   Title  Pt will be I with HEP upon discharge for Rt UE and posture    Time  8    Period  Weeks    Status  New    Target Date  04/04/18      PT LONG TERM GOAL #4   Title  Pt will be demo 4+/5 strength in Rt UE in all planes to return to part time work, volunteering.      Time  8    Period  Weeks    Status  New    Target Date  04/04/18      PT LONG TERM GOAL #5   Title  Pt will be able to chop, lift saucepan in the kitchen using Rt UE without pain.     Time  8    Period  Weeks    Status  New    Target Date  04/04/18      Additional Long Term Goals   Additional Long Term Goals  Yes      PT LONG TERM GOAL #6   Title  Pt will be able to resume normal walking schedule , 30 min or more , 3 days per week with no increased shoulder discomfort     Time  8    Period  Weeks    Status  New    Target Date  04/04/18            Plan - 02/28/18 1024    Clinical Impression Statement  Pt with limitations in AAROM in all planes, especially abduction/scaption.  Working towards goals.  Able to use pulleys and standing UE ranger with mild difficulty.     PT Treatment/Interventions  ADLs/Self Care Home Management;Electrical Stimulation;Iontophoresis 4mg /ml Dexamethasone;Moist Heat;Cryotherapy;Ultrasound;Patient/family education;Therapeutic exercise;Therapeutic activities;Functional mobility training;Neuromuscular re-education;Taping;Passive range of motion;Manual techniques;Vasopneumatic Device    PT Next Visit Plan  light AAROM, modalities    PT Home Exercise Plan  table slide flexion only and scapular retraction, pendulum, ball squeeze , putty     Consulted and Agree with Plan of Care  Patient       Patient will benefit from skilled therapeutic intervention in order to improve the following deficits and impairments:  Decreased mobility, Hypomobility, Increased muscle spasms, Pain, Postural dysfunction,  Impaired UE functional use, Impaired flexibility, Increased fascial restricitons, Decreased strength, Decreased range of motion, Decreased skin integrity, Increased edema  Visit Diagnosis: Muscle weakness (generalized)  Stiffness of right shoulder, not elsewhere classified  Localized edema  Chronic right shoulder pain  Acute pain of right  shoulder     Problem List Patient Active Problem List   Diagnosis Date Noted  . S/P right rotator cuff repair 02/02/2018  . Nontraumatic complete tear of right rotator cuff 11/15/2017  . Chronic right shoulder pain 11/15/2017  . Acute pain of right shoulder 10/18/2017    Shayne Deerman 02/28/2018, 10:27 AM  [facility] [address] [place], [place], [address] Phone: [phone]   Fax:  [phone]  Name: Caleb [name] MRN: [id number] Date of Birth: 1951/02/09  Karie MainlandJennifer Teleshia Lemere, PT 02/28/18 10:27 AM Phone: [phone] Fax: [phone]

## 2018-03-01 ENCOUNTER — Encounter: Payer: Self-pay | Admitting: Physical Therapy

## 2018-03-01 ENCOUNTER — Ambulatory Visit: Payer: Medicare Other | Admitting: Physical Therapy

## 2018-03-01 DIAGNOSIS — M6281 Muscle weakness (generalized): Secondary | ICD-10-CM | POA: Diagnosis not present

## 2018-03-01 DIAGNOSIS — G8929 Other chronic pain: Secondary | ICD-10-CM

## 2018-03-01 DIAGNOSIS — R600 Localized edema: Secondary | ICD-10-CM

## 2018-03-01 DIAGNOSIS — M25511 Pain in right shoulder: Secondary | ICD-10-CM

## 2018-03-01 DIAGNOSIS — M25611 Stiffness of right shoulder, not elsewhere classified: Secondary | ICD-10-CM

## 2018-03-01 NOTE — Therapy (Signed)
[facility] [address] [place], [place], [address] Phone: [phone]   Fax:  [phone]  Physical Therapy Treatment  Patient Details  Name: Caleb RamsClifton [name] MRN: [id number] Date of Birth: 17-Feb-1951 Referring Provider: Dr. Magnus IvanBlackman   Encounter Date: 03/01/2018  PT End of Session - 03/01/18 1117    Visit Number  7    Number of Visits  16    Date for PT Re-Evaluation  04/04/18    PT Start Time  1103    PT Stop Time  1150    PT Time Calculation (min)  47 min    Activity Tolerance  Patient tolerated treatment well    Behavior During Therapy  [facility] for tasks assessed/performed       Past Medical History:  Diagnosis Date  . High cholesterol   . Type II diabetes mellitus (HCC)     Past Surgical History:  Procedure Laterality Date  . LEFT HEART CATHETERIZATION WITH CORONARY ANGIOGRAM N/A 05/04/2013   Procedure: LEFT HEART CATHETERIZATION WITH CORONARY ANGIOGRAM;  Surgeon: Pamella PertJagadeesh R Ganji, MD;  [facility];  Service: Cardiovascular;  Laterality: N/A;  . NO PAST SURGERIES      There were no vitals filed for this visit.  Subjective Assessment - 03/01/18 1104    Subjective  Didnt rest well last night.  Pain in shoulder and elbow last night.  min right now in shoulder.     Currently in Pain?  Yes    Pain Score  3     Pain Location  Shoulder    Pain Orientation  Right    Pain Descriptors / Indicators  Aching    Pain Type  Surgical pain    Pain Onset  More than a month ago    Pain Frequency  Intermittent    Aggravating Factors   PM, sleeping     Pain Relieving Factors  sling, rest, meds, ice          [facility] PT Assessment - 03/01/18 0001      Observation/Other Assessments   Focus on Therapeutic Outcomes (FOTO)   75%      AROM   Right Shoulder Flexion  100 Degrees   AAROM    Right Shoulder ABduction  100 Degrees   AAROM    Right Shoulder Internal Rotation  --   AAROM to low lumbar, Rt glute      PROM    Right Shoulder Flexion  120 Degrees    Right Shoulder ABduction  100 Degrees    Right Shoulder External Rotation  45 Degrees         [facility] Adult PT Treatment/Exercise - 03/01/18 0001      Shoulder Exercises: Standing   External Rotation  AAROM;Both;10 reps    Flexion  AAROM;Both;10 reps    Extension  AAROM;Both;10 reps    Other Standing Exercises  standing cane against wall , chest press x 10       Shoulder Exercises: Pulleys   Flexion  3 minutes    Scaption  2 minutes    Other Pulley Exercises  IR in standing       Shoulder Exercises: Stretch   Table Stretch - Flexion  10 seconds    Table Stretch - Abduction  10 seconds    Table Stretch - External Rotation  10 seconds    Other Shoulder Stretches  AAROM above       Programme researcher, broadcasting/film/videolectrical Stimulation   Electrical Stimulation Location  --  Electrical Stimulation Action  --    Statisticianlectrical Stimulation Goals  --      Manual Therapy   Joint Mobilization  Gentle distraction, Gr 1 inferior glides    Passive ROM  all planes to tolerance              PT Education - 03/01/18 1116    Education provided  Yes    Education Details  standing AAROM     Person(s) Educated  Patient    Methods  Explanation;Verbal cues;Handout    Comprehension  Verbalized understanding;Need further instruction       PT Short Term Goals - 03/01/18 1117      PT SHORT TERM GOAL #1   Title  Pt will be I with HEP for Rt UE ROM and strength within protocol limitations    Status  Achieved      PT SHORT TERM GOAL #2   Title  Pt will be able to get dressed, complete ADLs/hygiene, with 25% less pain    Baseline  does not use for ADLs    Status  On-going      PT SHORT TERM GOAL #3   Title  Pt will be able to understand concepts of RICE and use for self care at home.     Status  Achieved        PT Long Term Goals - 03/01/18 1118      PT LONG TERM GOAL #1   Title  Pt will score <50% impaired on FOTO to demo improved functional mobility.       PT LONG  TERM GOAL #2   Title  Pt will be able to raise R arm overhead with pain minimal (120 deg or more) for home tasks, washing the car     Status  On-going      PT LONG TERM GOAL #3   Title  Pt will be I with HEP upon discharge for Rt UE and posture    Status  On-going      PT LONG TERM GOAL #4   Title  Pt will be demo 4+/5 strength in Rt UE in all planes to return to part time work, volunteering.     Status  On-going      PT LONG TERM GOAL #5   Title  Pt will be able to chop, lift saucepan in the kitchen using Rt UE without pain.     Status  On-going      PT LONG TERM GOAL #6   Title  Pt will be able to resume normal walking schedule , 30 min or more , 3 days per week with no increased shoulder discomfort     Status  On-going            Plan - 03/01/18 1142    Clinical Impression Statement  Patient sees MD Monday.  We discussed activity restrictions, progression of PT.  AAROM improving, PROM as well.     PT Treatment/Interventions  ADLs/Self Care Home Management;Electrical Stimulation;Iontophoresis 4mg /ml Dexamethasone;Moist Heat;Cryotherapy;Ultrasound;Patient/family education;Therapeutic exercise;Therapeutic activities;Functional mobility training;Neuromuscular re-education;Taping;Passive range of motion;Manual techniques;Vasopneumatic Device    PT Next Visit Plan  what did MD say.  Progress ROm and strength, isometrics     PT Home Exercise Plan  table slide flexion only and scapular retraction, pendulum, ball squeeze , putty     Consulted and Agree with Plan of Care  Patient       Patient will benefit from skilled therapeutic intervention in order to  improve the following deficits and impairments:  Decreased mobility, Hypomobility, Increased muscle spasms, Pain, Postural dysfunction, Impaired UE functional use, Impaired flexibility, Increased fascial restricitons, Decreased strength, Decreased range of motion, Decreased skin integrity, Increased edema  Visit Diagnosis: Stiffness  of right shoulder, not elsewhere classified  Localized edema  Chronic right shoulder pain  Acute pain of right shoulder  Muscle weakness (generalized)     Problem List Patient Active Problem List   Diagnosis Date Noted  . S/P right rotator cuff repair 02/02/2018  . Nontraumatic complete tear of right rotator cuff 11/15/2017  . Chronic right shoulder pain 11/15/2017  . Acute pain of right shoulder 10/18/2017    PAA,JENNIFER 03/01/2018, 11:50 AM  [facility] [address] [place], [place], [address] Phone: [phone]   Fax:  [phone]  Name: Caleb [name] MRN: [id number] Date of Birth: Aug 18, 1950  Karie Mainland, PT 03/01/18 11:50 AM Phone: [phone] Fax: [phone]

## 2018-03-02 ENCOUNTER — Encounter: Payer: BC Managed Care – PPO | Admitting: Physical Therapy

## 2018-03-06 ENCOUNTER — Encounter (INDEPENDENT_AMBULATORY_CARE_PROVIDER_SITE_OTHER): Payer: Self-pay | Admitting: Orthopaedic Surgery

## 2018-03-06 ENCOUNTER — Ambulatory Visit (INDEPENDENT_AMBULATORY_CARE_PROVIDER_SITE_OTHER): Payer: BC Managed Care – PPO | Admitting: Orthopaedic Surgery

## 2018-03-06 DIAGNOSIS — Z9889 Other specified postprocedural states: Secondary | ICD-10-CM

## 2018-03-06 NOTE — Progress Notes (Signed)
The patient is now 5 and half weeks status post a right shoulder arthroscopic rotator cuff repair.  He is 67 years old.  He understands this will be a slow process.  He has been going to physical therapy and compliant with this.  He is also been wearing a sling still.  At this point I told him he can be out of sling completely.  He still shows significant deficits of his rotator cuff with shoulder abduction being significantly weak and using more of his deltoids to abduct his shoulder.  We will continue aggressive outpatient therapy and a twice daily home exercise program for his shoulder.  He understands this can take 5 to 6 months to get good improvement.  I will see him back in 4 weeks he is doing overall.  All question concerns were answered and addressed.

## 2018-03-07 ENCOUNTER — Encounter: Payer: Self-pay | Admitting: Physical Therapy

## 2018-03-07 ENCOUNTER — Ambulatory Visit: Payer: Medicare Other | Admitting: Physical Therapy

## 2018-03-07 DIAGNOSIS — G8929 Other chronic pain: Secondary | ICD-10-CM

## 2018-03-07 DIAGNOSIS — M6281 Muscle weakness (generalized): Secondary | ICD-10-CM | POA: Diagnosis not present

## 2018-03-07 DIAGNOSIS — M25511 Pain in right shoulder: Secondary | ICD-10-CM

## 2018-03-07 DIAGNOSIS — R600 Localized edema: Secondary | ICD-10-CM

## 2018-03-07 DIAGNOSIS — M25611 Stiffness of right shoulder, not elsewhere classified: Secondary | ICD-10-CM

## 2018-03-07 NOTE — Therapy (Signed)
[facility] [address] [place], [place], [address] Phone: [phone]   Fax:  [phone]  Physical Therapy Treatment  Patient Details  Name: Caleb [name] MRN: [id number] Date of Birth: [birth year] Referring Provider: Dr. Magnus Ivan   Encounter Date: 03/07/2018  PT End of Session - 03/07/18 1244    Visit Number  8    Number of Visits  16    Date for PT Re-Evaluation  04/04/18    PT Start Time  1016    PT Stop Time  1110    PT Time Calculation (min)  54 min    Activity Tolerance  Patient tolerated treatment well    Behavior During Therapy  [facility] for tasks assessed/performed       Past Medical History:  Diagnosis Date  . High cholesterol   . Type II diabetes mellitus (HCC)     Past Surgical History:  Procedure Laterality Date  . LEFT HEART CATHETERIZATION WITH CORONARY ANGIOGRAM N/A 05/04/2013   Procedure: LEFT HEART CATHETERIZATION WITH CORONARY ANGIOGRAM;  Surgeon: Pamella Pert, MD;  Location: [facility] CATH LAB;  Service: Cardiovascular;  Laterality: N/A;  . NO PAST SURGERIES      There were no vitals filed for this visit.  Subjective Assessment - 03/07/18 1021    Subjective  Saw MD.  He was pleased.  Continue PT and do at home.  Went to [employer] .  Sleeping without sling was so much better.  Has a headache.     Currently in Pain?  Yes    Pain Score  4     Pain Location  Shoulder    Pain Orientation  Right    Pain Descriptors / Indicators  Aching    Pain Type  Surgical pain    Pain Onset  More than a month ago    Pain Frequency  Intermittent    Aggravating Factors   reaching up high     Pain Relieving Factors  rest, ice , meds                        [facility] Adult PT Treatment/Exercise - 03/07/18 0001      Shoulder Exercises: Standing   Flexion  AAROM;Both;10 reps    ABduction  AAROM;Right;10 reps    Extension  Strengthening;Both;12 reps    Theraband Level (Shoulder Extension)  Level 2 (Red)     Row  Strengthening;Both;10 reps    Theraband Level (Shoulder Row)  Level 2 (Red)    Other Standing Exercises  standing cane against wall , chest press x 10       Shoulder Exercises: ROM/Strengthening   Other ROM/Strengthening Exercises  standing wall slides, painful x 5       Shoulder Exercises: Isometric Strengthening   Flexion  5X10"    Extension  5X10"    External Rotation  5X10"    Internal Rotation  5X10"    ABduction  5X10"    ADduction  5X10"    Other Isometric Exercises  10 reps, 5 sec hold added to HEP       Cryotherapy   Number Minutes Cryotherapy  10 Minutes    Cryotherapy Location  Shoulder    Type of Cryotherapy  Ice pack      Manual Therapy   Manual Therapy  Joint mobilization;Scapular mobilization    Joint Mobilization  Gentle distraction, Gr 1 inferior glides    Soft tissue mobilization  ant and middle deltoid,  pec release     Scapular Mobilization  worked post cuff and upper trap as well     Passive ROM  all planes to tolerance              PT Education - 03/07/18 1243    Education provided  No       PT Short Term Goals - 03/01/18 1117      PT SHORT TERM GOAL #1   Title  Pt will be I with HEP for Rt UE ROM and strength within protocol limitations    Status  Achieved      PT SHORT TERM GOAL #2   Title  Pt will be able to get dressed, complete ADLs/hygiene, with 25% less pain    Baseline  does not use for ADLs    Status  On-going      PT SHORT TERM GOAL #3   Title  Pt will be able to understand concepts of RICE and use for self care at home.     Status  Achieved        PT Long Term Goals - 03/01/18 1118      PT LONG TERM GOAL #1   Title  Pt will score <50% impaired on FOTO to demo improved functional mobility.       PT LONG TERM GOAL #2   Title  Pt will be able to raise R arm overhead with pain minimal (120 deg or more) for home tasks, washing the car     Status  On-going      PT LONG TERM GOAL #3   Title  Pt will be I with HEP  upon discharge for Rt UE and posture    Status  On-going      PT LONG TERM GOAL #4   Title  Pt will be demo 4+/5 strength in Rt UE in all planes to return to part time work, volunteering.     Status  On-going      PT LONG TERM GOAL #5   Title  Pt will be able to chop, lift saucepan in the kitchen using Rt UE without pain.     Status  On-going      PT LONG TERM GOAL #6   Title  Pt will be able to resume normal walking schedule , 30 min or more , 3 days per week with no increased shoulder discomfort     Status  On-going            Plan - 03/07/18 1244    Clinical Impression Statement  Moving on to next phase of PT, about 6 weeks out.  MD recommended aggressive PT.  Made more appts.  Worked on pain in Rt post shoulder, burning in post cuff. Much better post session.  Sling DC>     PT Treatment/Interventions  ADLs/Self Care Home Management;Electrical Stimulation;Iontophoresis 4mg /ml Dexamethasone;Moist Heat;Cryotherapy;Ultrasound;Patient/family education;Therapeutic exercise;Therapeutic activities;Functional mobility training;Neuromuscular re-education;Taping;Passive range of motion;Manual techniques;Vasopneumatic Device    PT Next Visit Plan  Progress ROm and strength, isometrics     PT Home Exercise Plan  scapular retraction, pendulum, ball squeeze , putty , isometrics, standing wall, cane     Consulted and Agree with Plan of Care  Patient       Patient will benefit from skilled therapeutic intervention in order to improve the following deficits and impairments:  Decreased mobility, Hypomobility, Increased muscle spasms, Pain, Postural dysfunction, Impaired UE functional use, Impaired flexibility, Increased fascial restricitons, Decreased strength, Decreased range  of motion, Decreased skin integrity, Increased edema  Visit Diagnosis: Stiffness of right shoulder, not elsewhere classified  Localized edema  Chronic right shoulder pain  Acute pain of right shoulder  Muscle  weakness (generalized)     Problem List Patient Active Problem List   Diagnosis Date Noted  . S/P right rotator cuff repair 02/02/2018  . Nontraumatic complete tear of right rotator cuff 11/15/2017  . Chronic right shoulder pain 11/15/2017  . Acute pain of right shoulder 10/18/2017    Mykai Wendorf 03/07/2018, 12:46 PM  [facility] [address] [place], [place], [address] Phone: [phone]   Fax:  [phone]  Name: Dierks Wach MRN: [id number] Date of Birth: Aug 19, 1950  Karie Mainland, PT 03/07/18 12:46 PM Phone: [phone] Fax: [phone]

## 2018-03-07 NOTE — Patient Instructions (Signed)
Access Code: [email or web address]  URL: [email or web address]  Date: 03/07/2018  Prepared by: Karie MainlandJennifer Johnta Couts   Exercises  Isometric Shoulder Flexion at Wall - 10 reps - 1 sets - 10 hold - 1x daily - 7x weekly  Isometric Shoulder Abduction at Wall - 10 reps - 1 sets - 10 hold - 1x daily - 7x weekly  Seated Isometric Shoulder External Rotation - 10 reps - 1 sets - 10 hold - 1x daily - 7x weekly  Standing Bilateral Low Shoulder Row with Anchored Resistance - 10 reps - 2 sets - 5 hold - 1x daily - 7x weekly

## 2018-03-09 ENCOUNTER — Encounter: Payer: Self-pay | Admitting: Physical Therapy

## 2018-03-09 ENCOUNTER — Ambulatory Visit: Payer: Medicare Other | Admitting: Physical Therapy

## 2018-03-09 DIAGNOSIS — M6281 Muscle weakness (generalized): Secondary | ICD-10-CM | POA: Diagnosis not present

## 2018-03-09 DIAGNOSIS — M25511 Pain in right shoulder: Secondary | ICD-10-CM

## 2018-03-09 DIAGNOSIS — M25611 Stiffness of right shoulder, not elsewhere classified: Secondary | ICD-10-CM

## 2018-03-09 DIAGNOSIS — R600 Localized edema: Secondary | ICD-10-CM

## 2018-03-09 DIAGNOSIS — G8929 Other chronic pain: Secondary | ICD-10-CM

## 2018-03-09 NOTE — Therapy (Signed)
[facility] [address] [place], [place], [address] Phone: [phone]   Fax:  [phone]  Physical Therapy Treatment  Patient Details  Name: Caleb [name] MRN: [id number] Date of Birth: Feb 13, 1951 Referring Provider: Dr. Magnus Ivan   Encounter Date: 03/09/2018  PT End of Session - 03/09/18 1841    Visit Number  9    Number of Visits  16    Date for PT Re-Evaluation  04/04/18    PT Start Time  0847    PT Stop Time  0943    PT Time Calculation (min)  56 min    Activity Tolerance  Patient tolerated treatment well    Behavior During Therapy  [facility] for tasks assessed/performed       Past Medical History:  Diagnosis Date  . High cholesterol   . Type II diabetes mellitus (HCC)     Past Surgical History:  Procedure Laterality Date  . LEFT HEART CATHETERIZATION WITH CORONARY ANGIOGRAM N/A 05/04/2013   Procedure: LEFT HEART CATHETERIZATION WITH CORONARY ANGIOGRAM;  Surgeon: Pamella Pert, MD;  Location: [facility] CATH LAB;  Service: Cardiovascular;  Laterality: N/A;  . NO PAST SURGERIES      There were no vitals filed for this visit.  Subjective Assessment - 03/09/18 0851    Subjective  No pain right now.  Able to sleep a little better with the pillow.  He is doing the exercises 10- 12 minutes a day .    Currently in Pain?  Yes    Pain Score  0-No pain   up to 5-6/10   Pain Location  Shoulder    Pain Orientation  Right;Posterior    Pain Descriptors / Indicators  Aching;Burning    Pain Type  Surgical pain    Pain Radiating Towards  No    Pain Frequency  Intermittent    Aggravating Factors   reaching,  rolling on    Pain Relieving Factors  rest ice meds         [facility] PT Assessment - 03/09/18 0001      PROM   Right Shoulder Flexion  140 Degrees                   [facility] Adult PT Treatment/Exercise - 03/09/18 0001      Shoulder Exercises: Supine   Other Supine Exercises  supine cane chest press and  flexion      Shoulder Exercises: Seated   Other Seated Exercises  table slides 3 different angles,  HEP      Shoulder Exercises: Standing   Extension  Strengthening;Both;12 reps    Theraband Level (Shoulder Extension)  Level 2 (Red)    Row  Strengthening;Both;10 reps    Theraband Level (Shoulder Row)  Level 2 (Red)      Shoulder Exercises: Pulleys   Flexion  3 minutes    Scaption  3 minutes      Cryotherapy   Number Minutes Cryotherapy  10 Minutes    Cryotherapy Location  Shoulder    Type of Cryotherapy  --   cold pack     Manual Therapy   Manual Therapy  Joint mobilization    Manual therapy comments  also self mobs    Joint Mobilization  Gentle distraction, Gr 1 inferior glides    Soft tissue mobilization  scar tissue  work,  scapular mobilization trese minor  tight    Passive ROM  all planes to tolerance  PT Education - 03/09/18 1841    Education provided  No       PT Short Term Goals - 03/01/18 1117      PT SHORT TERM GOAL #1   Title  Pt will be I with HEP for Rt UE ROM and strength within protocol limitations    Status  Achieved      PT SHORT TERM GOAL #2   Title  Pt will be able to get dressed, complete ADLs/hygiene, with 25% less pain    Baseline  does not use for ADLs    Status  On-going      PT SHORT TERM GOAL #3   Title  Pt will be able to understand concepts of RICE and use for self care at home.     Status  Achieved        PT Long Term Goals - 03/01/18 1118      PT LONG TERM GOAL #1   Title  Pt will score <50% impaired on FOTO to demo improved functional mobility.       PT LONG TERM GOAL #2   Title  Pt will be able to raise R arm overhead with pain minimal (120 deg or more) for home tasks, washing the car     Status  On-going      PT LONG TERM GOAL #3   Title  Pt will be I with HEP upon discharge for Rt UE and posture    Status  On-going      PT LONG TERM GOAL #4   Title  Pt will be demo 4+/5 strength in Rt UE in all  planes to return to part time work, volunteering.     Status  On-going      PT LONG TERM GOAL #5   Title  Pt will be able to chop, lift saucepan in the kitchen using Rt UE without pain.     Status  On-going      PT LONG TERM GOAL #6   Title  Pt will be able to resume normal walking schedule , 30 min or more , 3 days per week with no increased shoulder discomfort     Status  On-going            Plan - 03/09/18 1841    Clinical Impression Statement  Patient demo increased Shoulder flexion post manual AA .  He tolerated exercises today with brief increases in pain.  He was able to make progress toward his HEP goals.  No pain at end of session,  just sore.    PT Next Visit Plan  Progress ROm and strength, isometrics ,  review table slides    PT Home Exercise Plan  scapular retraction, pendulum, ball squeeze , putty , isometrics, standing wall, cane ,  table slides    Consulted and Agree with Plan of Care  Patient       Patient will benefit from skilled therapeutic intervention in order to improve the following deficits and impairments:     Visit Diagnosis: Stiffness of right shoulder, not elsewhere classified  Localized edema  Chronic right shoulder pain  Acute pain of right shoulder  Muscle weakness (generalized)     Problem List Patient Active Problem List   Diagnosis Date Noted  . S/P right rotator cuff repair 02/02/2018  . Nontraumatic complete tear of right rotator cuff 11/15/2017  . Chronic right shoulder pain 11/15/2017  . Acute pain of right shoulder 10/18/2017  Norita Meigs PTA 03/09/2018, 6:45 PM  [facility] [address] [place], [place], [address] Phone: [phone]   Fax:  [phone]  Name: Caleb [name] MRN: [id number] Date of Birth: [birth year]

## 2018-03-14 ENCOUNTER — Ambulatory Visit: Payer: Medicare Other | Attending: Orthopaedic Surgery | Admitting: Physical Therapy

## 2018-03-14 ENCOUNTER — Encounter: Payer: Self-pay | Admitting: Physical Therapy

## 2018-03-14 DIAGNOSIS — M25611 Stiffness of right shoulder, not elsewhere classified: Secondary | ICD-10-CM | POA: Insufficient documentation

## 2018-03-14 DIAGNOSIS — G8929 Other chronic pain: Secondary | ICD-10-CM | POA: Diagnosis not present

## 2018-03-14 DIAGNOSIS — R600 Localized edema: Secondary | ICD-10-CM | POA: Diagnosis not present

## 2018-03-14 DIAGNOSIS — M25511 Pain in right shoulder: Secondary | ICD-10-CM | POA: Insufficient documentation

## 2018-03-14 DIAGNOSIS — M6281 Muscle weakness (generalized): Secondary | ICD-10-CM

## 2018-03-14 NOTE — Therapy (Signed)
[place], [place], [address] Phone: [phone]   Fax:  [phone]  Physical Therapy Treatment  Patient Details  Name: Caleb [name] MRN: [id number] Date of Birth: Aug 12, 1950 Referring Provider: Dr. Ninfa Linden   Encounter Date: 03/14/2018  PT End of Session - 03/14/18 0828    Visit Number  10    Number of Visits  16    Date for PT Re-Evaluation  04/04/18    PT Start Time  0804    PT Stop Time  0905    PT Time Calculation (min)  61 min    Activity Tolerance  Patient tolerated treatment well    Behavior During Therapy  [facility] for tasks assessed/performed       Past Medical History:  Diagnosis Date  . High cholesterol   . Type II diabetes mellitus ([place])     Past Surgical History:  Procedure Laterality Date  . LEFT HEART CATHETERIZATION WITH CORONARY ANGIOGRAM N/A 05/04/2013   Procedure: LEFT HEART CATHETERIZATION WITH CORONARY ANGIOGRAM;  Surgeon: Laverda Page, MD;  Location: [facility] CATH LAB;  Service: Cardiovascular;  Laterality: N/A;  . NO PAST SURGERIES      There were no vitals filed for this visit.  Subjective Assessment - 03/14/18 0804    Subjective  It really only hurts at night.  Burns post shoulder.  During the day i'm good.  Doing my exercises.      Currently in Pain?  No/denies         [facility] PT Assessment - 03/14/18 0001      PROM   Right Shoulder Flexion  155 Degrees    Right Shoulder External Rotation  52 Degrees      Strength   Right Shoulder Flexion  4-/5    Right Shoulder Internal Rotation  5/5    Right Shoulder External Rotation  4-/5         [facility] Adult PT Treatment/Exercise - 03/14/18 0001      Shoulder Exercises: Supine   Horizontal ABduction  AAROM;Right;10 reps    External Rotation  AAROM;Right;10 reps    Other Supine Exercises  supine cane chest press and flexion x 10       Shoulder Exercises: Standing   External Rotation  AAROM;Right;10 reps     Internal Rotation  AAROM;Right;10 reps   towel    Other Standing Exercises  standing cane: flexion, abduction x 5 slow , cues to avoid arching       Shoulder Exercises: ROM/Strengthening   Other ROM/Strengthening Exercises  wall ladder x 5 flexion mod cues       Moist Heat Therapy   Number Minutes Moist Heat  15 Minutes    Moist Heat Location  Shoulder      Electrical Stimulation   Electrical Stimulation Location  Rt shoulder post to ant     Electrical Stimulation Action  IFC     Electrical Stimulation Parameters  to tolerance     Electrical Stimulation Goals  Pain      Manual Therapy   Soft tissue mobilization  teres minor    Scapular Mobilization  Rt scapula gentle     Passive ROM  all planes to tolerance              PT Education - 03/14/18 0851    Education provided  Yes    Education Details  heat is OK     Person(s) Educated  Patient    Methods  Explanation    Comprehension  Verbalized understanding       PT Short Term Goals - 03/14/18 0831      PT SHORT TERM GOAL #1   Title  Pt will be I with HEP for Rt UE ROM and strength within protocol limitations    Status  Achieved      PT SHORT TERM GOAL #2   Title  Pt will be able to get dressed, complete ADLs/hygiene, with 25% less pain    Status  Achieved      PT SHORT TERM GOAL #3   Title  Pt will be able to understand concepts of RICE and use for self care at home.     Status  Achieved        PT Long Term Goals - 03/14/18 0831      PT LONG TERM GOAL #1   Title  Pt will score <50% impaired on FOTO to demo improved functional mobility.     Status  On-going      PT LONG TERM GOAL #2   Title  Pt will be able to raise R arm overhead with pain minimal (120 deg or more) for home tasks, washing the car     Baseline  pain min against gravity     Status  Partially Met      PT LONG TERM GOAL #3   Title  Pt will be I with HEP upon discharge for Rt UE and posture    Status  On-going      PT LONG TERM GOAL #4    Title  Pt will be demo 4+/5 strength in Rt UE in all planes to return to part time work, volunteering.     Status  On-going      PT LONG TERM GOAL #5   Title  Pt will be able to chop, lift saucepan in the kitchen using Rt UE without pain.     Baseline  not really cutting but doing dishes     Status  Partially Met      PT LONG TERM GOAL #6   Title  Pt will be able to resume normal walking schedule , 30 min or more , 3 days per week with no increased shoulder discomfort     Status  On-going            Plan - 03/14/18 0836    Clinical Impression Statement  PAtient improving in ROM in all planes.  No longer has pain during the day. Reassured him that this was normal  for where he is in the process .      PT Treatment/Interventions  ADLs/Self Care Home Management;Electrical Stimulation;Iontophoresis '4mg'$ /ml Dexamethasone;Moist Heat;Cryotherapy;Ultrasound;Patient/family education;Therapeutic exercise;Therapeutic activities;Functional mobility training;Neuromuscular re-education;Taping;Passive range of motion;Manual techniques;Vasopneumatic Device    PT Next Visit Plan  Progress ROm and strength, manual     PT Home Exercise Plan  scapular retraction, pendulum, ball squeeze , putty , isometrics, standing wall, cane ,  table slides    Consulted and Agree with Plan of Care  Patient       Patient will benefit from skilled therapeutic intervention in order to improve the following deficits and impairments:  Decreased mobility, Hypomobility, Increased muscle spasms, Pain, Postural dysfunction, Impaired UE functional use, Impaired flexibility, Increased fascial restricitons, Decreased strength, Decreased range of motion, Decreased skin integrity, Increased edema  Visit Diagnosis: Stiffness of right shoulder, not elsewhere classified  Localized edema  Chronic right shoulder pain  Acute pain of right  shoulder  Muscle weakness (generalized)     Problem List Patient Active Problem List    Diagnosis Date Noted  . S/P right rotator cuff repair 02/02/2018  . Nontraumatic complete tear of right rotator cuff 11/15/2017  . Chronic right shoulder pain 11/15/2017  . Acute pain of right shoulder 10/18/2017    Mieshia Pepitone 03/14/2018, 9:58 AM  [facility] [address] [place], [place], [address] Phone: [phone]   Fax:  [phone]  Name: Caleb [name] MRN: [id number] Date of Birth: [birth year]  Raeford Razor, PT 03/14/18 9:58 AM Phone: [phone] Fax: [phone]

## 2018-03-16 ENCOUNTER — Encounter: Payer: Self-pay | Admitting: Physical Therapy

## 2018-03-16 ENCOUNTER — Ambulatory Visit: Payer: Medicare Other | Admitting: Physical Therapy

## 2018-03-16 DIAGNOSIS — G8929 Other chronic pain: Secondary | ICD-10-CM | POA: Diagnosis not present

## 2018-03-16 DIAGNOSIS — R600 Localized edema: Secondary | ICD-10-CM

## 2018-03-16 DIAGNOSIS — M25511 Pain in right shoulder: Secondary | ICD-10-CM

## 2018-03-16 DIAGNOSIS — M25611 Stiffness of right shoulder, not elsewhere classified: Secondary | ICD-10-CM | POA: Diagnosis not present

## 2018-03-16 DIAGNOSIS — M6281 Muscle weakness (generalized): Secondary | ICD-10-CM | POA: Diagnosis not present

## 2018-03-16 NOTE — Therapy (Signed)
[place], [place], [address] Phone: [phone]   Fax:  [phone]  Physical Therapy Treatment  Patient Details  Name: Caleb [name] MRN: [id number] Date of Birth: [birth year] Referring Provider: Dr. Ninfa Linden   Encounter Date: 03/16/2018  PT End of Session - 03/16/18 1008    Visit Number  11    Number of Visits  16    Date for PT Re-Evaluation  04/04/18    Authorization Type  FOTO at visit 29, KX at 31     PT Start Time  1010    PT Stop Time  1113    PT Time Calculation (min)  63 min    Activity Tolerance  Patient tolerated treatment well    Behavior During Therapy  [facility] for tasks assessed/performed       Past Medical History:  Diagnosis Date  . High cholesterol   . Type II diabetes mellitus ([place])     Past Surgical History:  Procedure Laterality Date  . LEFT HEART CATHETERIZATION WITH CORONARY ANGIOGRAM N/A 05/04/2013   Procedure: LEFT HEART CATHETERIZATION WITH CORONARY ANGIOGRAM;  Surgeon: Laverda Page, MD;  Location: [facility] CATH LAB;  Service: Cardiovascular;  Laterality: N/A;  . NO PAST SURGERIES      There were no vitals filed for this visit.  Subjective Assessment - 03/16/18 1008    Subjective  I'm OK tonight.  not having pain right now.     Currently in Pain?  No/denies        [facility] Adult PT Treatment/Exercise - 03/16/18 0001      Shoulder Exercises: Supine   Horizontal ABduction  Strengthening;Both;10 reps    Theraband Level (Shoulder Horizontal ABduction)  Level 1 (Yellow)    External Rotation  Strengthening;Both;10 reps    Theraband Level (Shoulder External Rotation)  Level 1 (Yellow)    Flexion  Strengthening;Right;10 reps    Theraband Level (Shoulder Flexion)  Level 1 (Yellow)    Shoulder Flexion Weight (lbs)  unable to do- done without wgt ,elbow flexed slightly     Other Supine Exercises  punch Rt UE at 90 deg flexion x 10     Other Supine Exercises  small circles Rt  UE at shoudler hgt. x 10 each direction       Shoulder Exercises: Standing   Flexion  Strengthening;Right;10 reps    Theraband Level (Shoulder Flexion)  Level 1 (Yellow)    Row  Strengthening;Both;20 reps;Theraband    Theraband Level (Shoulder Row)  Level 4 (Blue)    Row Weight (lbs)  high row       Shoulder Exercises: ROM/Strengthening   UBE (Upper Arm Bike)  level 1, forward, easy pace, encouraged him to use equal pressure as best he could     Ball on Wall  3 min     Other ROM/Strengthening Exercises  standing wall slides with towel    x 10 flexion and x 10 scaption      Moist Heat Therapy   Number Minutes Moist Heat  10 Minutes    Moist Heat Location  Shoulder      Manual Therapy   Soft tissue mobilization  Rt bicep, ant deltoid, mid deltoid upper trap    Rt teres minor, subscap, levator    Passive ROM  all planes to tolerance                PT Short Term Goals - 03/14/18 0831      PT SHORT  TERM GOAL #1   Title  Pt will be I with HEP for Rt UE ROM and strength within protocol limitations    Status  Achieved      PT SHORT TERM GOAL #2   Title  Pt will be able to get dressed, complete ADLs/hygiene, with 25% less pain    Status  Achieved      PT SHORT TERM GOAL #3   Title  Pt will be able to understand concepts of RICE and use for self care at home.     Status  Achieved        PT Long Term Goals - 03/14/18 0831      PT LONG TERM GOAL #1   Title  Pt will score <50% impaired on FOTO to demo improved functional mobility.     Status  On-going      PT LONG TERM GOAL #2   Title  Pt will be able to raise R arm overhead with pain minimal (120 deg or more) for home tasks, washing the car     Baseline  pain min against gravity     Status  Partially Met      PT LONG TERM GOAL #3   Title  Pt will be I with HEP upon discharge for Rt UE and posture    Status  On-going      PT LONG TERM GOAL #4   Title  Pt will be demo 4+/5 strength in Rt UE in all planes to return  to part time work, volunteering.     Status  On-going      PT LONG TERM GOAL #5   Title  Pt will be able to chop, lift saucepan in the kitchen using Rt UE without pain.     Baseline  not really cutting but doing dishes     Status  Partially Met      PT LONG TERM GOAL #6   Title  Pt will be able to resume normal walking schedule , 30 min or more , 3 days per week with no increased shoulder discomfort     Status  On-going            Plan - 03/16/18 1006    Clinical Impression Statement  Worked on supine strengthening and standing ROM to maximize function.  Rt elbow flexed during exercises, due to guarding long term? ROM in all planes improved, especially External rotation end of session 60 deg with 45 deg abd.     PT Treatment/Interventions  ADLs/Self Care Home Management;Electrical Stimulation;Iontophoresis '4mg'$ /ml Dexamethasone;Moist Heat;Cryotherapy;Ultrasound;Patient/family education;Therapeutic exercise;Therapeutic activities;Functional mobility training;Neuromuscular re-education;Taping;Passive range of motion;Manual techniques;Vasopneumatic Device    PT Next Visit Plan  UBE, supine band for flex, abd , Progress ROM and strength, manual , STW to periscap. heat or ice     PT Home Exercise Plan  scapular retraction, pendulum, ball squeeze , putty , isometrics, standing wall, cane ,  table slides    Consulted and Agree with Plan of Care  Patient       Patient will benefit from skilled therapeutic intervention in order to improve the following deficits and impairments:  Decreased mobility, Hypomobility, Increased muscle spasms, Pain, Postural dysfunction, Impaired UE functional use, Impaired flexibility, Increased fascial restricitons, Decreased strength, Decreased range of motion, Decreased skin integrity, Increased edema  Visit Diagnosis: Stiffness of right shoulder, not elsewhere classified  Localized edema  Chronic right shoulder pain  Acute pain of right shoulder  Muscle  weakness (generalized)  Problem List Patient Active Problem List   Diagnosis Date Noted  . S/P right rotator cuff repair 02/02/2018  . Nontraumatic complete tear of right rotator cuff 11/15/2017  . Chronic right shoulder pain 11/15/2017  . Acute pain of right shoulder 10/18/2017    Eleno Weimar 03/16/2018, 11:09 AM  [place], [place], [address] Phone: [phone]   Fax:  [phone]  Name: Lemonte Al MRN: [id number] Date of Birth: 04-Oct-1950  Raeford Razor, PT 03/16/18 11:09 AM Phone: [phone] Fax: [phone]

## 2018-03-21 ENCOUNTER — Ambulatory Visit: Payer: Medicare Other | Admitting: Physical Therapy

## 2018-03-21 ENCOUNTER — Encounter: Payer: Self-pay | Admitting: Physical Therapy

## 2018-03-21 DIAGNOSIS — M25611 Stiffness of right shoulder, not elsewhere classified: Secondary | ICD-10-CM

## 2018-03-21 DIAGNOSIS — G8929 Other chronic pain: Secondary | ICD-10-CM

## 2018-03-21 DIAGNOSIS — M6281 Muscle weakness (generalized): Secondary | ICD-10-CM | POA: Diagnosis not present

## 2018-03-21 DIAGNOSIS — M25511 Pain in right shoulder: Secondary | ICD-10-CM | POA: Diagnosis not present

## 2018-03-21 DIAGNOSIS — R600 Localized edema: Secondary | ICD-10-CM | POA: Diagnosis not present

## 2018-03-21 NOTE — Therapy (Signed)
[place], [place], [address] Phone: [phone]   Fax:  [phone]  Physical Therapy Treatment  Patient Details  Name: Caleb [name] MRN: [id number] Date of Birth: 1951-03-23 Referring Provider: Dr. Ninfa Linden   Encounter Date: 03/21/2018  PT End of Session - 03/21/18 1840    Visit Number  12    Number of Visits  16    Date for PT Re-Evaluation  04/04/18    PT Start Time  0930    PT Stop Time  1026    PT Time Calculation (min)  56 min    Behavior During Therapy  [facility] for tasks assessed/performed       Past Medical History:  Diagnosis Date  . High cholesterol   . Type II diabetes mellitus ([place])     Past Surgical History:  Procedure Laterality Date  . LEFT HEART CATHETERIZATION WITH CORONARY ANGIOGRAM N/A 05/04/2013   Procedure: LEFT HEART CATHETERIZATION WITH CORONARY ANGIOGRAM;  Surgeon: Laverda Page, MD;  Location: [facility] CATH LAB;  Service: Cardiovascular;  Laterality: N/A;  . NO PAST SURGERIES      There were no vitals filed for this visit.  Subjective Assessment - 03/21/18 0937    Subjective  Shoulder was acting up over the weekend.   No pain right now.    Currently in Pain?  No/denies    Pain Location  Shoulder    Pain Orientation  Right;Posterior    Pain Descriptors / Indicators  Aching   also has a light headach.   Pain Type  Surgical pain    Pain Radiating Towards  little finger has been sore since surgery.  down arm to fingers    Aggravating Factors   laying down,  cold rooms    Pain Relieving Factors  rest ice meds,  goes outside to warm up    Effect of Pain on Daily Activities  Not back to normal ADL's yard work.           [facility] PT Assessment - 03/21/18 0001      AROM   Right Shoulder External Rotation  42 Degrees    neutral                  [facility] Adult PT Treatment/Exercise - 03/21/18 0001      Shoulder Exercises: Supine   External Rotation   Strengthening;10 reps;Theraband    Theraband Level (Shoulder External Rotation)  Level 1 (Yellow)    Flexion  Strengthening;10 reps;AROM    Other Supine Exercises  punch 3 LBA  CGA 10 x no pain,  triceps 3 LBS 10 X       Shoulder Exercises: Standing   Flexion  10 reps   towel on wall both   ABduction  10 reps   scaption wall slide with towel  6/10     Shoulder Exercises: ROM/Strengthening   UBE (Upper Arm Bike)  level 1, forward, easy pace, encouraged him to use equal pressure as best he could       Cryotherapy   Number Minutes Cryotherapy  10 Minutes    Cryotherapy Location  Shoulder    Type of Cryotherapy  --   cold pack     Manual Therapy   Manual Therapy  Joint mobilization    Joint Mobilization  inferior glides posterior glides for shoulder ROM    Soft tissue mobilization  teres minor biceps upper trap softened.     Passive ROM  all planes  to tolerance              PT Education - 03/21/18 1840    Education provided  No       PT Short Term Goals - 03/14/18 0831      PT SHORT TERM GOAL #1   Title  Pt will be I with HEP for Rt UE ROM and strength within protocol limitations    Status  Achieved      PT SHORT TERM GOAL #2   Title  Pt will be able to get dressed, complete ADLs/hygiene, with 25% less pain    Status  Achieved      PT SHORT TERM GOAL #3   Title  Pt will be able to understand concepts of RICE and use for self care at home.     Status  Achieved        PT Long Term Goals - 03/21/18 1844      PT LONG TERM GOAL #1   Title  Pt will score <50% impaired on FOTO to demo improved functional mobility.     Time  8    Period  Weeks    Status  Unable to assess      PT LONG TERM GOAL #2   Title  Pt will be able to raise R arm overhead with pain minimal (120 deg or more) for home tasks, washing the car     Baseline  pain varies,  raisung arm overhead limited at times    Time  8    Period  Weeks    Status  Partially Met      PT LONG TERM GOAL #3    Title  Pt will be I with HEP upon discharge for Rt UE and posture    Baseline  ongoing exercise additions    Time  8    Status  On-going      PT LONG TERM GOAL #4   Title  Pt will be demo 4+/5 strength in Rt UE in all planes to return to part time work, volunteering.     Time  8    Period  Weeks    Status  Unable to assess      PT LONG TERM GOAL #5   Title  Pt will be able to chop, lift saucepan in the kitchen using Rt UE without pain.     Baseline  can peel a potato,  not chopping  afraid may not be ready or may do too much    Time  8    Period  Weeks    Status  Partially Met      PT LONG TERM GOAL #6   Title  Pt will be able to resume normal walking schedule , 30 min or more , 3 days per week with no increased shoulder discomfort     Time  8    Period  Weeks    Status  On-going   pain incerases with walking           Plan - 03/21/18 1841    Clinical Impression Statement  patient continues to work on stretches and strengthening.  Pain flared over weekend may be due to starting new strengthening.  Pain 3/10 at end of session priotr to cold pack.    See f;low sheet for RO,M.    PT Next Visit Plan  UBE, supine band for flex, abd , Progress ROM and strength, manual , STW to periscap. heat or  ice add yellow band to HEP    PT Home Exercise Plan  scapular retraction, pendulum, ball squeeze , putty , isometrics, standing wall, cane ,  table slides    Consulted and Agree with Plan of Care  Patient       Patient will benefit from skilled therapeutic intervention in order to improve the following deficits and impairments:     Visit Diagnosis: Stiffness of right shoulder, not elsewhere classified  Localized edema  Chronic right shoulder pain  Acute pain of right shoulder  Muscle weakness (generalized)     Problem List Patient Active Problem List   Diagnosis Date Noted  . S/P right rotator cuff repair 02/02/2018  . Nontraumatic complete tear of right rotator cuff  11/15/2017  . Chronic right shoulder pain 11/15/2017  . Acute pain of right shoulder 10/18/2017    HARRIS,KAREN PTA 03/21/2018, 6:47 PM  [place], [place], [address] Phone: [phone]   Fax:  [phone]  Name: Caleb [name] MRN: [id number] Date of Birth: 09/15/1950

## 2018-03-24 ENCOUNTER — Encounter: Payer: Self-pay | Admitting: Physical Therapy

## 2018-03-24 ENCOUNTER — Ambulatory Visit: Payer: Medicare Other | Admitting: Physical Therapy

## 2018-03-24 DIAGNOSIS — M25511 Pain in right shoulder: Secondary | ICD-10-CM | POA: Diagnosis not present

## 2018-03-24 DIAGNOSIS — G8929 Other chronic pain: Secondary | ICD-10-CM | POA: Diagnosis not present

## 2018-03-24 DIAGNOSIS — M25611 Stiffness of right shoulder, not elsewhere classified: Secondary | ICD-10-CM

## 2018-03-24 DIAGNOSIS — R600 Localized edema: Secondary | ICD-10-CM | POA: Diagnosis not present

## 2018-03-24 DIAGNOSIS — M6281 Muscle weakness (generalized): Secondary | ICD-10-CM

## 2018-03-24 NOTE — Patient Instructions (Signed)
FLEXION: Sitting - Resistance Band (Active)    Sit with right arm down. Against yellow resistance band, lift arm forward and up as high as possible, keeping elbow straight. Complete _1-2__ sets of _10-20__ repetitions. Perform __2_ sessions per day. YOU CAN DO THIS LYING DOWN< SITTING <STANDING   Copyright  VHI. All rights reserved.

## 2018-03-24 NOTE — Therapy (Signed)
[place], [place], [address] Phone: [phone]   Fax:  [phone]  Physical Therapy Treatment  Patient Details  Name: Caleb [name] MRN: [id number] Date of Birth: 04/23/1951 Referring Provider: Dr. Ninfa Linden   Encounter Date: 03/24/2018  PT End of Session - 03/24/18 0954    Visit Number  13    Number of Visits  16    Date for PT Re-Evaluation  04/04/18    Authorization Type  FOTO at visit 20, KX at 57     PT Start Time  0933    PT Stop Time  1032    PT Time Calculation (min)  59 min    Activity Tolerance  Patient tolerated treatment well    Behavior During Therapy  [facility] for tasks assessed/performed       Past Medical History:  Diagnosis Date  . High cholesterol   . Type II diabetes mellitus ([place])     Past Surgical History:  Procedure Laterality Date  . LEFT HEART CATHETERIZATION WITH CORONARY ANGIOGRAM N/A 05/04/2013   Procedure: LEFT HEART CATHETERIZATION WITH CORONARY ANGIOGRAM;  Surgeon: Laverda Page, MD;  Location: [facility] CATH LAB;  Service: Cardiovascular;  Laterality: N/A;  . NO PAST SURGERIES      There were no vitals filed for this visit.  Subjective Assessment - 03/24/18 0943    Subjective  No pain right now.  Slept pretty good.     Currently in Pain?  No/denies           [facility] Adult PT Treatment/Exercise - 03/24/18 0001      Shoulder Exercises: Standing   Horizontal ABduction  Strengthening;Both;10 reps;Theraband    Theraband Level (Shoulder Horizontal ABduction)  Level 2 (Red)    External Rotation  AAROM;Right;10 reps   10 sec hold    Flexion  10 reps   towel on wall both   ABduction  10 reps   scaption wall slide with towel  6/10   Extension  Strengthening;Both;15 reps    Theraband Level (Shoulder Extension)  Level 3 (Green)    Row  Strengthening;Both;20 reps;Theraband    Theraband Level (Shoulder Row)  Level 4 (Blue)      Shoulder Exercises: ROM/Strengthening    UBE (Upper Arm Bike)  3 min FW and 3 min Back       Manual Therapy   Joint Mobilization  inferior glides posterior glides for shoulder ROM    Soft tissue mobilization  Rt bicep, ant deltoid, mid deltoid upper trap    Rt teres minor, subscap, levator    Scapular Mobilization  gentle     Passive ROM  all planes to tolerance              PT Education - 03/24/18 0953    Education provided  Yes    Education Details  form with exercise     Person(s) Educated  Patient    Methods  Explanation    Comprehension  Verbalized understanding       PT Short Term Goals - 03/14/18 0831      PT SHORT TERM GOAL #1   Title  Pt will be I with HEP for Rt UE ROM and strength within protocol limitations    Status  Achieved      PT SHORT TERM GOAL #2   Title  Pt will be able to get dressed, complete ADLs/hygiene, with 25% less pain    Status  Achieved  PT SHORT TERM GOAL #3   Title  Pt will be able to understand concepts of RICE and use for self care at home.     Status  Achieved        PT Long Term Goals - 03/21/18 1844      PT LONG TERM GOAL #1   Title  Pt will score <50% impaired on FOTO to demo improved functional mobility.     Time  8    Period  Weeks    Status  Unable to assess      PT LONG TERM GOAL #2   Title  Pt will be able to raise R arm overhead with pain minimal (120 deg or more) for home tasks, washing the car     Baseline  pain varies,  raisung arm overhead limited at times    Time  8    Period  Weeks    Status  Partially Met      PT LONG TERM GOAL #3   Title  Pt will be I with HEP upon discharge for Rt UE and posture    Baseline  ongoing exercise additions    Time  8    Status  On-going      PT LONG TERM GOAL #4   Title  Pt will be demo 4+/5 strength in Rt UE in all planes to return to part time work, volunteering.     Time  8    Period  Weeks    Status  Unable to assess      PT LONG TERM GOAL #5   Title  Pt will be able to chop, lift saucepan in  the kitchen using Rt UE without pain.     Baseline  can peel a potato,  not chopping  afraid may not be ready or may do too much    Time  8    Period  Weeks    Status  Partially Met      PT LONG TERM GOAL #6   Title  Pt will be able to resume normal walking schedule , 30 min or more , 3 days per week with no increased shoulder discomfort     Time  8    Period  Weeks    Status  On-going   pain incerases with walking           Plan - 03/24/18 0958    Clinical Impression Statement  Pain increases with exercises <4/10 , difficulty with reaching into scaption, abduction.  Wants to walk in the park and he may use his older less bulky sling just so he can walk and not increase pain.     PT Treatment/Interventions  ADLs/Self Care Home Management;Electrical Stimulation;Iontophoresis 25m/ml Dexamethasone;Moist Heat;Cryotherapy;Ultrasound;Patient/family education;Therapeutic exercise;Therapeutic activities;Functional mobility training;Neuromuscular re-education;Taping;Passive range of motion;Manual techniques;Vasopneumatic Device    PT Next Visit Plan  UBE, supine band for flex, abd , Progress ROM and strength, manual , STW to periscap. heat or ice add yellow band to HEP    PT Home Exercise Plan  scapular retraction, pendulum, ball squeeze , putty , isometrics, standing wall, cane ,  table slides    Consulted and Agree with Plan of Care  Patient       Patient will benefit from skilled therapeutic intervention in order to improve the following deficits and impairments:  Decreased mobility, Hypomobility, Increased muscle spasms, Pain, Postural dysfunction, Impaired UE functional use, Impaired flexibility, Increased fascial restricitons, Decreased strength, Decreased range of motion,  Decreased skin integrity, Increased edema  Visit Diagnosis: Stiffness of right shoulder, not elsewhere classified  Localized edema  Chronic right shoulder pain  Acute pain of right shoulder  Muscle weakness  (generalized)     Problem List Patient Active Problem List   Diagnosis Date Noted  . S/P right rotator cuff repair 02/02/2018  . Nontraumatic complete tear of right rotator cuff 11/15/2017  . Chronic right shoulder pain 11/15/2017  . Acute pain of right shoulder 10/18/2017    PAA,JENNIFER 03/24/2018, 10:37 AM  [place], [place], [address] Phone: [phone]   Fax:  [phone]  Name: Caleb [name] MRN: [id number] Date of Birth: 12-Jul-1951  Raeford Razor, PT 03/24/18 10:37 AM Phone: [phone] Fax: [phone]

## 2018-03-28 ENCOUNTER — Encounter: Payer: Self-pay | Admitting: Physical Therapy

## 2018-03-28 ENCOUNTER — Ambulatory Visit: Payer: Medicare Other | Admitting: Physical Therapy

## 2018-03-28 DIAGNOSIS — R600 Localized edema: Secondary | ICD-10-CM | POA: Diagnosis not present

## 2018-03-28 DIAGNOSIS — G8929 Other chronic pain: Secondary | ICD-10-CM | POA: Diagnosis not present

## 2018-03-28 DIAGNOSIS — M25511 Pain in right shoulder: Secondary | ICD-10-CM | POA: Diagnosis not present

## 2018-03-28 DIAGNOSIS — M25611 Stiffness of right shoulder, not elsewhere classified: Secondary | ICD-10-CM | POA: Diagnosis not present

## 2018-03-28 DIAGNOSIS — M6281 Muscle weakness (generalized): Secondary | ICD-10-CM

## 2018-03-28 NOTE — Therapy (Signed)
[place], [place], [address] Phone: [phone]   Fax:  [phone]  Physical Therapy Treatment  Patient Details  Name: Caleb [name] MRN: [id number] Date of Birth: 11-15-1950 Referring Provider: Dr. Ninfa Linden   Encounter Date: 03/28/2018  PT End of Session - 03/28/18 0956    Visit Number  14    Number of Visits  16    Date for PT Re-Evaluation  04/04/18    Authorization Type  FOTO at visit 5, KX at 105     PT Start Time  0930    PT Stop Time  1024    PT Time Calculation (min)  54 min    Activity Tolerance  Patient tolerated treatment well    Behavior During Therapy  [facility] for tasks assessed/performed       Past Medical History:  Diagnosis Date  . High cholesterol   . Type II diabetes mellitus ([place])     Past Surgical History:  Procedure Laterality Date  . LEFT HEART CATHETERIZATION WITH CORONARY ANGIOGRAM N/A 05/04/2013   Procedure: LEFT HEART CATHETERIZATION WITH CORONARY ANGIOGRAM;  Surgeon: Laverda Page, MD;  Location: [facility] CATH LAB;  Service: Cardiovascular;  Laterality: N/A;  . NO PAST SURGERIES      There were no vitals filed for this visit.  Subjective Assessment - 03/28/18 0934    Subjective  I'm aching today.  6/10 .  Pain lat night and this AM.     Currently in Pain?  Yes    Pain Score  6     Pain Location  Shoulder    Pain Orientation  Right;Anterior;Posterior    Pain Descriptors / Indicators  Aching;Sore    Pain Type  Surgical pain    Pain Onset  More than a month ago    Pain Frequency  Intermittent    Aggravating Factors   PM, laying down    Pain Relieving Factors  heat, ice, PT stretching                [facility] Adult PT Treatment/Exercise - 03/28/18 0001      Self-Care   Heat/Ice Application  heat to relax, pain end ofthe day when laying down     Other Self-Care Comments   trigger points, ROM , tennis ball against wall       Shoulder Exercises: Standing   Horizontal ABduction  Strengthening;Both;10 reps;Theraband    Theraband Level (Shoulder Horizontal ABduction)  Level 2 (Red)    External Rotation  Strengthening;Both;20 reps    Flexion  Strengthening;Both;10 reps    Other Standing Exercises  used ball behind back for scapular stab exercises      Moist Heat Therapy   Number Minutes Moist Heat  10 Minutes    Moist Heat Location  Shoulder      Manual Therapy   Manual therapy comments  showed self care with tennis ball for self care Tr P release     Joint Mobilization  distraction, oscillation     Soft tissue mobilization  deltoid, teres minor, levator scapula, upper trap     Scapular Mobilization  gentle     Passive ROM  all planes to tolerance              PT Education - 03/28/18 0955    Education provided  Yes    Education Details  self care     Person(s) Educated  Patient    Methods  Explanation  Comprehension  Verbalized understanding       PT Short Term Goals - 03/14/18 0831      PT SHORT TERM GOAL #1   Title  Pt will be I with HEP for Rt UE ROM and strength within protocol limitations    Status  Achieved      PT SHORT TERM GOAL #2   Title  Pt will be able to get dressed, complete ADLs/hygiene, with 25% less pain    Status  Achieved      PT SHORT TERM GOAL #3   Title  Pt will be able to understand concepts of RICE and use for self care at home.     Status  Achieved        PT Long Term Goals - 03/21/18 1844      PT LONG TERM GOAL #1   Title  Pt will score <50% impaired on FOTO to demo improved functional mobility.     Time  8    Period  Weeks    Status  Unable to assess      PT LONG TERM GOAL #2   Title  Pt will be able to raise R arm overhead with pain minimal (120 deg or more) for home tasks, washing the car     Baseline  pain varies,  raisung arm overhead limited at times    Time  8    Period  Weeks    Status  Partially Met      PT LONG TERM GOAL #3   Title  Pt will be I with HEP upon discharge  for Rt UE and posture    Baseline  ongoing exercise additions    Time  8    Status  On-going      PT LONG TERM GOAL #4   Title  Pt will be demo 4+/5 strength in Rt UE in all planes to return to part time work, volunteering.     Time  8    Period  Weeks    Status  Unable to assess      PT LONG TERM GOAL #5   Title  Pt will be able to chop, lift saucepan in the kitchen using Rt UE without pain.     Baseline  can peel a potato,  not chopping  afraid may not be ready or may do too much    Time  8    Period  Weeks    Status  Partially Met      PT LONG TERM GOAL #6   Title  Pt will be able to resume normal walking schedule , 30 min or more , 3 days per week with no increased shoulder discomfort     Time  8    Period  Weeks    Status  On-going   pain incerases with walking           Plan - 03/28/18 1015    Clinical Impression Statement  Pt with increased pain and soreness today.  Spent time addressing burning in scapular area, used tennis ball.  Sees MD next week he may opt for 1 time per week as he cont to have pain and weakness.  Will follow.     PT Treatment/Interventions  ADLs/Self Care Home Management;Electrical Stimulation;Iontophoresis 43m/ml Dexamethasone;Moist Heat;Cryotherapy;Ultrasound;Patient/family education;Therapeutic exercise;Therapeutic activities;Functional mobility training;Neuromuscular re-education;Taping;Passive range of motion;Manual techniques;Vasopneumatic Device    PT Next Visit Plan  UBE, supine band for flex, abd , Progress ROM and strength, manual ,  STW to periscap. heat or ice add yellow band to HEP    PT Home Exercise Plan  scapular retraction, pendulum, ball squeeze , putty , isometrics, standing wall, cane ,  table slides    Consulted and Agree with Plan of Care  Patient       Patient will benefit from skilled therapeutic intervention in order to improve the following deficits and impairments:  Decreased mobility, Hypomobility, Increased muscle  spasms, Pain, Postural dysfunction, Impaired UE functional use, Impaired flexibility, Increased fascial restricitons, Decreased strength, Decreased range of motion, Decreased skin integrity, Increased edema  Visit Diagnosis: Stiffness of right shoulder, not elsewhere classified  Localized edema  Chronic right shoulder pain  Acute pain of right shoulder  Muscle weakness (generalized)     Problem List Patient Active Problem List   Diagnosis Date Noted  . S/P right rotator cuff repair 02/02/2018  . Nontraumatic complete tear of right rotator cuff 11/15/2017  . Chronic right shoulder pain 11/15/2017  . Acute pain of right shoulder 10/18/2017    Laroy Mustard 03/28/2018, 10:27 AM  [facility] [address] [place], [place], [address] Phone: [phone]   Fax:  [phone]  Name: Ismael Karge MRN: [id number] Date of Birth: March 27, 1951  Raeford Razor, PT 03/28/18 10:27 AM Phone: [phone] Fax: [phone]

## 2018-03-30 ENCOUNTER — Ambulatory Visit: Payer: Medicare Other | Admitting: Physical Therapy

## 2018-03-30 DIAGNOSIS — M25611 Stiffness of right shoulder, not elsewhere classified: Secondary | ICD-10-CM

## 2018-03-30 DIAGNOSIS — G8929 Other chronic pain: Secondary | ICD-10-CM | POA: Diagnosis not present

## 2018-03-30 DIAGNOSIS — R600 Localized edema: Secondary | ICD-10-CM | POA: Diagnosis not present

## 2018-03-30 DIAGNOSIS — M6281 Muscle weakness (generalized): Secondary | ICD-10-CM | POA: Diagnosis not present

## 2018-03-30 DIAGNOSIS — M25511 Pain in right shoulder: Secondary | ICD-10-CM

## 2018-03-30 NOTE — Patient Instructions (Signed)
ROM: Towel Stretch - with Interior Rotation    Pull left arm up behind back by pulling towel up with other arm. Hold ____10 seconds. Repeat __10__ times per set. Do _1___ sets per session. Do __2__ sessions per day. http://orth.exer.us/888   Copyright  VHI. All rights reserved.

## 2018-03-30 NOTE — Therapy (Signed)
[place], [place], [address] Phone: [phone]   Fax:  [phone]  Physical Therapy Treatment  Patient Details  Name: Caleb [name] MRN: [id number] Date of Birth: [birth year] Referring Provider: Dr. Ninfa Linden   Encounter Date: 03/30/2018  PT End of Session - 03/30/18 1254    Visit Number  15    Number of Visits  16    Date for PT Re-Evaluation  04/04/18    Authorization Type  FOTO at visit 34, KX at 35     PT Start Time  1100    PT Stop Time  1205    PT Time Calculation (min)  65 min    Activity Tolerance  Patient tolerated treatment well    Behavior During Therapy  [facility] for tasks assessed/performed       Past Medical History:  Diagnosis Date  . High cholesterol   . Type II diabetes mellitus ([place])     Past Surgical History:  Procedure Laterality Date  . LEFT HEART CATHETERIZATION WITH CORONARY ANGIOGRAM N/A 05/04/2013   Procedure: LEFT HEART CATHETERIZATION WITH CORONARY ANGIOGRAM;  Surgeon: Laverda Page, MD;  Location: [facility] CATH LAB;  Service: Cardiovascular;  Laterality: N/A;  . NO PAST SURGERIES      There were no vitals filed for this visit.  Subjective Assessment - 03/30/18 1106    Subjective  No pain today.      Currently in Pain?  No/denies         [facility] Adult PT Treatment/Exercise - 03/30/18 0001      Shoulder Exercises: Supine   Horizontal ABduction  Strengthening;Right;10 reps    Theraband Level (Shoulder Horizontal ABduction)  --   single arm 2 lb    External Rotation  Strengthening;Right;15 reps    Theraband Level (Shoulder External Rotation)  Level 2 (Red)    Flexion  Strengthening    Shoulder Flexion Weight (lbs)  2    Other Supine Exercises  punch 2 lbs and tricep ext 2 lbs x 10       Shoulder Exercises: Sidelying   Other Sidelying Exercises  abduction x 10 , 2 lbs, External rotation 3lbs x 10       Shoulder Exercises: Stretch   External Rotation Stretch  5  reps    Wall Stretch - Flexion  5 reps    Other Shoulder Stretches  Internal rot, add, ext with strap x 30 sec x 3 HEP       Moist Heat Therapy   Number Minutes Moist Heat  15 Minutes    Moist Heat Location  Shoulder      Electrical Stimulation   Electrical Stimulation Location  Rt anterior shoulder    Electrical Stimulation Action  IFC    Electrical Stimulation Parameters  to tol    Electrical Stimulation Goals  Pain             PT Education - 03/30/18 1153    Education provided  Yes    Education Details  IR stretch     Person(s) Educated  Patient    Methods  Explanation;Handout    Comprehension  Verbalized understanding;Verbal cues required       PT Short Term Goals - 03/14/18 0831      PT SHORT TERM GOAL #1   Title  Pt will be I with HEP for Rt UE ROM and strength within protocol limitations    Status  Achieved      PT SHORT  TERM GOAL #2   Title  Pt will be able to get dressed, complete ADLs/hygiene, with 25% less pain    Status  Achieved      PT SHORT TERM GOAL #3   Title  Pt will be able to understand concepts of RICE and use for self care at home.     Status  Achieved        PT Long Term Goals - 03/21/18 1844      PT LONG TERM GOAL #1   Title  Pt will score <50% impaired on FOTO to demo improved functional mobility.     Time  8    Period  Weeks    Status  Unable to assess      PT LONG TERM GOAL #2   Title  Pt will be able to raise R arm overhead with pain minimal (120 deg or more) for home tasks, washing the car     Baseline  pain varies,  raisung arm overhead limited at times    Time  8    Period  Weeks    Status  Partially Met      PT LONG TERM GOAL #3   Title  Pt will be I with HEP upon discharge for Rt UE and posture    Baseline  ongoing exercise additions    Time  8    Status  On-going      PT LONG TERM GOAL #4   Title  Pt will be demo 4+/5 strength in Rt UE in all planes to return to part time work, volunteering.     Time  8     Period  Weeks    Status  Unable to assess      PT LONG TERM GOAL #5   Title  Pt will be able to chop, lift saucepan in the kitchen using Rt UE without pain.     Baseline  can peel a potato,  not chopping  afraid may not be ready or may do too much    Time  8    Period  Weeks    Status  Partially Met      PT LONG TERM GOAL #6   Title  Pt will be able to resume normal walking schedule , 30 min or more , 3 days per week with no increased shoulder discomfort     Time  8    Period  Weeks    Status  On-going   pain incerases with walking           Plan - 03/30/18 1255    Clinical Impression Statement  Pt has pain only at night and on rare occasions, ADLs.  Has used tennis ball as recommended with good relief.  Making functional gains despite pain.      PT Treatment/Interventions  ADLs/Self Care Home Management;Electrical Stimulation;Iontophoresis 21m/ml Dexamethasone;Moist Heat;Cryotherapy;Ultrasound;Patient/family education;Therapeutic exercise;Therapeutic activities;Functional mobility training;Neuromuscular re-education;Taping;Passive range of motion;Manual techniques;Vasopneumatic Device    PT Next Visit Plan  UBE, supine band for flex, abd , Progress ROM and strength, manual , STW to periscap. heat or ice add yellow band to HEP    PT Home Exercise Plan  scapular retraction, pendulum, ball squeeze , putty , isometrics, standing wall, cane ,  table slides    Consulted and Agree with Plan of Care  Patient       Patient will benefit from skilled therapeutic intervention in order to improve the following deficits and impairments:  Decreased mobility,  Hypomobility, Increased muscle spasms, Pain, Postural dysfunction, Impaired UE functional use, Impaired flexibility, Increased fascial restricitons, Decreased strength, Decreased range of motion, Decreased skin integrity, Increased edema  Visit Diagnosis: Stiffness of right shoulder, not elsewhere classified  Localized edema  Chronic  right shoulder pain  Acute pain of right shoulder  Muscle weakness (generalized)     Problem List Patient Active Problem List   Diagnosis Date Noted  . S/P right rotator cuff repair 02/02/2018  . Nontraumatic complete tear of right rotator cuff 11/15/2017  . Chronic right shoulder pain 11/15/2017  . Acute pain of right shoulder 10/18/2017    Gurneet Matarese 03/30/2018, 1:06 PM  [place], [place], [address] Phone: [phone]   Fax:  [phone]  Name: Maciej Schweitzer MRN: [id number] Date of Birth: [birth year]

## 2018-04-04 ENCOUNTER — Ambulatory Visit: Payer: Medicare Other | Admitting: Physical Therapy

## 2018-04-04 ENCOUNTER — Encounter: Payer: Self-pay | Admitting: Physical Therapy

## 2018-04-04 DIAGNOSIS — M25511 Pain in right shoulder: Secondary | ICD-10-CM

## 2018-04-04 DIAGNOSIS — G8929 Other chronic pain: Secondary | ICD-10-CM | POA: Diagnosis not present

## 2018-04-04 DIAGNOSIS — M25611 Stiffness of right shoulder, not elsewhere classified: Secondary | ICD-10-CM

## 2018-04-04 DIAGNOSIS — R600 Localized edema: Secondary | ICD-10-CM

## 2018-04-04 DIAGNOSIS — M6281 Muscle weakness (generalized): Secondary | ICD-10-CM

## 2018-04-04 NOTE — Therapy (Signed)
[place], [place], [address] Phone: [phone]   Fax:  [phone]  Physical Therapy Treatment  Patient Details  Name: Caleb [name] MRN: [id number] Date of Birth: 1951/05/15 Referring Provider: Dr. Ninfa Linden   Encounter Date: 04/04/2018  PT End of Session - 04/04/18 1016    Visit Number  16    Number of Visits  16    Date for PT Re-Evaluation  04/04/18    Authorization Type  FOTO at visit 61, KX at 37     PT Start Time  0933    PT Stop Time  1027    PT Time Calculation (min)  54 min    Activity Tolerance  Patient tolerated treatment well    Behavior During Therapy  [facility] for tasks assessed/performed       Past Medical History:  Diagnosis Date  . High cholesterol   . Type II diabetes mellitus ([place])     Past Surgical History:  Procedure Laterality Date  . LEFT HEART CATHETERIZATION WITH CORONARY ANGIOGRAM N/A 05/04/2013   Procedure: LEFT HEART CATHETERIZATION WITH CORONARY ANGIOGRAM;  Surgeon: Laverda Page, MD;  Location: [facility] CATH LAB;  Service: Cardiovascular;  Laterality: N/A;  . NO PAST SURGERIES      There were no vitals filed for this visit.  Subjective Assessment - 04/04/18 0935    Subjective  No pain today.  I put walking on hold for awhile.  I do the exercises .  i am waiting for the day I am able to sleep well.     Currently in Pain?  No/denies    Pain Score  0-No pain   6-7/10 pain at night,  better with sitting ib bed and working the shoulder.   Pain Location  Shoulder    Pain Orientation  Right;Anterior;Posterior    Pain Descriptors / Indicators  Aching;Sore    Pain Radiating Towards  tingles in little finger.    Pain Frequency  Intermittent    Aggravating Factors   laying down.stretching overhead.  using arm to wash under opposits arm    Pain Relieving Factors  working the shoulder,  ice    Effect of Pain on Daily Activities  sleep disturbed         [facility] PT  Assessment - 04/04/18 0001      AROM   Right Shoulder Flexion  127 Degrees    No pain     Strength   Right Shoulder Flexion  4/5    Right Shoulder ABduction  4/5    Right Shoulder Internal Rotation  5/5    Right Shoulder External Rotation  4/5                   [facility] Adult PT Treatment/Exercise - 04/04/18 0001      Shoulder Exercises: Seated   Other Seated Exercises  triceps green band 10 X ans sitting elbow supported 3 LBS 10 X challanging      Shoulder Exercises: Standing   Horizontal ABduction  Strengthening    Theraband Level (Shoulder Horizontal ABduction)  Level 3 (Green)    External Rotation  10 reps   with roll   Theraband Level (Shoulder External Rotation)  Level 3 (Green)    Internal Rotation  10 reps   with roll,  green band   Extension  Strengthening;Right;10 reps    Theraband Level (Shoulder Extension)  Level 3 (Green)    Row  10 reps;Strengthening  Theraband Level (Shoulder Row)  Level 3 (Green)    Other Standing Exercises  wall push up 10 X   UE Ranger to work on horizontal ADDuction 10 X     Shoulder Exercises: Pulleys   Flexion  2 minutes      Shoulder Exercises: ROM/Strengthening   UBE (Upper Arm Bike)  3 min FW and 3 min Back       Cryotherapy   Number Minutes Cryotherapy  10 Minutes    Cryotherapy Location  Shoulder    Type of Cryotherapy  --   cold pack              PT Short Term Goals - 03/14/18 0831      PT SHORT TERM GOAL #1   Title  Pt will be I with HEP for Rt UE ROM and strength within protocol limitations    Status  Achieved      PT SHORT TERM GOAL #2   Title  Pt will be able to get dressed, complete ADLs/hygiene, with 25% less pain    Status  Achieved      PT SHORT TERM GOAL #3   Title  Pt will be able to understand concepts of RICE and use for self care at home.     Status  Achieved        PT Long Term Goals - 04/04/18 0942      PT LONG TERM GOAL #1   Title  Pt will score <50% impaired on FOTO to  demo improved functional mobility.     Time  8    Period  Weeks    Status  Unable to assess      PT LONG TERM GOAL #2   Title  Pt will be able to raise R arm overhead with pain minimal (120 deg or more) for home tasks, washing the car     Baseline  127   no pain in clinic    Time  8    Period  Weeks    Status  Achieved      PT LONG TERM GOAL #3   Title  Pt will be I with HEP upon discharge for Rt UE and posture    Baseline  ongoing exercise additions    Time  8    Period  Weeks    Status  On-going      PT LONG TERM GOAL #4   Title  Pt will be demo 4+/5 strength in Rt UE in all planes to return to part time work, volunteering.     Baseline  IR 5/5,  others 4/5.  pain in neck during MMT    Time  8    Period  Weeks    Status  On-going      PT LONG TERM GOAL #5   Title  Pt will be able to chop, lift saucepan in the kitchen using Rt UE without pain.     Baseline  HAS NOT BEEN IN KITCHEN A LOT    Time  8    Period  Weeks    Status  Unable to assess      PT LONG TERM GOAL #6   Title  Pt will be able to resume normal walking schedule , 30 min or more , 3 days per week with no increased shoulder discomfort    has avoided walking lately   Time  8    Period  Weeks    Status  On-going  Plan - 04/04/18 1037    Clinical Impression Statement  No pain at end of session.  STG#2 met.   Strength 4 to 5/5 in shoulder.  Patient was able to progress to green band without pain today.  1 more more visit scheduled    PT Next Visit Plan  1 more visit  . See how MD visit went.    PT Home Exercise Plan  scapular retraction, pendulum, ball squeeze , putty , isometrics, standing wall, cane ,  table slides.   shoulder bands    Consulted and Agree with Plan of Care  Patient       Patient will benefit from skilled therapeutic intervention in order to improve the following deficits and impairments:     Visit Diagnosis: Stiffness of right shoulder, not elsewhere  classified  Localized edema  Chronic right shoulder pain  Acute pain of right shoulder  Muscle weakness (generalized)     Problem List Patient Active Problem List   Diagnosis Date Noted  . S/P right rotator cuff repair 02/02/2018  . Nontraumatic complete tear of right rotator cuff 11/15/2017  . Chronic right shoulder pain 11/15/2017  . Acute pain of right shoulder 10/18/2017    Valita Righter  PTA 04/04/2018, 10:54 AM  [place], [place], [address] Phone: [phone]   Fax:  [phone]  Name: Caleb [name] MRN: [id number] Date of Birth: 01/01/1951

## 2018-04-05 ENCOUNTER — Ambulatory Visit (INDEPENDENT_AMBULATORY_CARE_PROVIDER_SITE_OTHER): Payer: BC Managed Care – PPO | Admitting: Orthopaedic Surgery

## 2018-04-05 ENCOUNTER — Encounter (INDEPENDENT_AMBULATORY_CARE_PROVIDER_SITE_OTHER): Payer: Self-pay | Admitting: Orthopaedic Surgery

## 2018-04-05 DIAGNOSIS — Z125 Encounter for screening for malignant neoplasm of prostate: Secondary | ICD-10-CM | POA: Diagnosis not present

## 2018-04-05 DIAGNOSIS — E7800 Pure hypercholesterolemia, unspecified: Secondary | ICD-10-CM | POA: Diagnosis not present

## 2018-04-05 DIAGNOSIS — Z9889 Other specified postprocedural states: Secondary | ICD-10-CM

## 2018-04-05 DIAGNOSIS — E119 Type 2 diabetes mellitus without complications: Secondary | ICD-10-CM | POA: Diagnosis not present

## 2018-04-05 DIAGNOSIS — Z0000 Encounter for general adult medical examination without abnormal findings: Secondary | ICD-10-CM | POA: Diagnosis not present

## 2018-04-05 MED ORDER — LIDOCAINE HCL 1 % IJ SOLN
3.0000 mL | INTRAMUSCULAR | Status: AC | PRN
  Administered 2018-04-05: 3 mL

## 2018-04-05 MED ORDER — METHYLPREDNISOLONE ACETATE 40 MG/ML IJ SUSP
40.0000 mg | INTRAMUSCULAR | Status: AC | PRN
  Administered 2018-04-05: 40 mg via INTRA_ARTICULAR

## 2018-04-05 NOTE — Progress Notes (Signed)
   Procedure Note  Patient: Caleb [name]             Date of Birth: [birth year]           MRN: [id number]             Visit Date: 04/05/2018  Procedures: Visit Diagnoses: S/P right rotator cuff repair  Large Joint Inj: R subacromial bursa on 04/05/2018 3:47 PM Indications: pain and diagnostic evaluation Details: 22 G 1.5 in needle  Arthrogram: No  Medications: 3 mL lidocaine 1 %; 40 mg methylPREDNISolone acetate 40 MG/ML Outcome: tolerated well, no immediate complications Procedure, treatment alternatives, risks and benefits explained, specific risks discussed. Consent was given by the patient. Immediately prior to procedure a time out was called to verify the correct patient, procedure, equipment, support staff and site/side marked as required. Patient was prepped and draped in the usual sterile fashion.    The patient is now between 9 to 10 weeks status post a right shoulder arthroscopy with an arthroscopically assisted rotator cuff repair.  He has been going through extensive physical therapy with his right shoulder.  He says tomorrow is his last visit.  He still has some pain and soreness in the shoulder in general but is making progress.  On exam I agree that his progress is increased in terms of improved motion of his right shoulder from his last exam.  He is still using somewhat of his deltoids to abduct his shoulder but not nearly like he was before surgery and even at the last visit.  He is made significant improvements.  There is still some slight weakness of the cuff to be expected.  He is 67 years old.  I did provide a steroid injection subacromial outlet area since he is finishing therapy and I think this will help with just general inflammation in the shoulder postoperative.  We will see him back in 4 weeks to see how is doing overall but no x-rays are needed.  He will transition to home exercise program after tomorrow's PT visit

## 2018-04-06 ENCOUNTER — Encounter: Payer: Self-pay | Admitting: Physical Therapy

## 2018-04-06 ENCOUNTER — Ambulatory Visit: Payer: Medicare Other | Admitting: Physical Therapy

## 2018-04-06 DIAGNOSIS — M25511 Pain in right shoulder: Secondary | ICD-10-CM | POA: Diagnosis not present

## 2018-04-06 DIAGNOSIS — G8929 Other chronic pain: Secondary | ICD-10-CM

## 2018-04-06 DIAGNOSIS — R600 Localized edema: Secondary | ICD-10-CM | POA: Diagnosis not present

## 2018-04-06 DIAGNOSIS — M25611 Stiffness of right shoulder, not elsewhere classified: Secondary | ICD-10-CM | POA: Diagnosis not present

## 2018-04-06 DIAGNOSIS — M6281 Muscle weakness (generalized): Secondary | ICD-10-CM | POA: Diagnosis not present

## 2018-04-06 NOTE — Therapy (Signed)
[place], [place], [address] Phone: [phone]   Fax:  [phone]  Physical Therapy Treatment/Discharge  Patient Details  Name: Caleb [name] MRN: [id number] Date of Birth: 07-12-1951 Referring Provider (PT): Dr. Ninfa Linden   Encounter Date: 04/06/2018  PT End of Session - 04/06/18 1026    Visit Number  18    Number of Visits  17    Date for PT Re-Evaluation  04/04/18    Authorization Type  FOTO at visit 74, KX at 75     PT Start Time  1010    PT Stop Time  1100    PT Time Calculation (min)  50 min    Activity Tolerance  Patient tolerated treatment well    Behavior During Therapy  [facility] for tasks assessed/performed       Past Medical History:  Diagnosis Date  . High cholesterol   . Type II diabetes mellitus ([place])     Past Surgical History:  Procedure Laterality Date  . LEFT HEART CATHETERIZATION WITH CORONARY ANGIOGRAM N/A 05/04/2013   Procedure: LEFT HEART CATHETERIZATION WITH CORONARY ANGIOGRAM;  Surgeon: Laverda Page, MD;  Location: [facility] CATH LAB;  Service: Cardiovascular;  Laterality: N/A;  . NO PAST SURGERIES      There were no vitals filed for this visit.  Subjective Assessment - 04/06/18 1014    Subjective  Saw my doctor and he said I'm ready to be DC.  Got an injection.  Had some trouble last night cause I had a couple shots (flu, pneumonia).      Currently in Pain?  No/denies         [facility] PT Assessment - 04/06/18 0001      Observation/Other Assessments   Focus on Therapeutic Outcomes (FOTO)   50%      AROM   Right Shoulder Flexion  127 Degrees    Right Shoulder ABduction  108 Degrees      Strength   Right Shoulder Flexion  4/5    Right Shoulder ABduction  4/5    Right Shoulder Internal Rotation  5/5    Right Shoulder External Rotation  4/5        [facility] Adult PT Treatment/Exercise - 04/06/18 0001      Shoulder Exercises: Supine   Other Supine Exercises  supine  cane ER/IR , adduction and , flexion x 10       Shoulder Exercises: Standing   Horizontal ABduction  Strengthening    Theraband Level (Shoulder Horizontal ABduction)  Level 3 (Green)    External Rotation  10 reps    Theraband Level (Shoulder External Rotation)  Level 3 (Green)    Flexion  Strengthening;Right;10 reps    Theraband Level (Shoulder Flexion)  Level 2 (Red)      Shoulder Exercises: ROM/Strengthening   UBE (Upper Arm Bike)  3 min FW and 3 min Back     Other ROM/Strengthening Exercises  cane exercises in standing extension x 10, IR slide up back      Shoulder Exercises: Stretch   Other Shoulder Stretches  corner stretch 30 sec x 3       Moist Heat Therapy   Number Minutes Moist Heat  10 Minutes    Moist Heat Location  Shoulder      Manual Therapy   Joint Mobilization  Rt UE distraction, inferior glides     Soft tissue mobilization  anterior, deltoid, upper trap, rhomboids, teres     Scapular Mobilization  Rt gentle    Passive ROM  all planes to tolerance              PT Education - 04/06/18 1024    Education provided  Yes    Education Details  DC, FOTO results, stretching , final HEP     Person(s) Educated  Patient    Methods  Explanation    Comprehension  Verbalized understanding;Returned demonstration       PT Short Term Goals - 03/14/18 0831      PT SHORT TERM GOAL #1   Title  Pt will be I with HEP for Rt UE ROM and strength within protocol limitations    Status  Achieved      PT SHORT TERM GOAL #2   Title  Pt will be able to get dressed, complete ADLs/hygiene, with 25% less pain    Status  Achieved      PT SHORT TERM GOAL #3   Title  Pt will be able to understand concepts of RICE and use for self care at home.     Status  Achieved        PT Long Term Goals - 04/06/18 1026      PT LONG TERM GOAL #1   Title  Pt will score <50% impaired on FOTO to demo improved functional mobility.     Baseline  right at 50%     Status  Achieved      PT  LONG TERM GOAL #2   Title  Pt will be able to raise R arm overhead with pain minimal (120 deg or more) for home tasks, washing the car     Baseline  127   no pain in clinic    Status  Achieved      PT LONG TERM GOAL #3   Title  Pt will be I with HEP upon discharge for Rt UE and posture    Status  Achieved      PT LONG TERM GOAL #4   Title  Pt will be demo 4+/5 strength in Rt UE in all planes to return to part time work, volunteering.     Status  Partially Met      PT LONG TERM GOAL #5   Title  Pt will be able to chop, lift saucepan in the kitchen using Rt UE without pain.     Baseline  can simulate in the clinic     Status  Partially Met      PT LONG TERM GOAL #6   Title  Pt will be able to resume normal walking schedule , 30 min or more , 3 days per week with no increased shoulder discomfort     Status  Not Met            Plan - 04/06/18 1255    Clinical Impression Statement  Patient has self limited certain activities due to discomfort.  Patient has improved his FOTO score by 46%.  He is pleased with his progress and hopes that his recent in jection will help with sleep comfort.  DC     PT Next Visit Plan  NA    PT Home Exercise Plan  scapular retraction, pendulum, ball squeeze , putty , isometrics, standing wall, cane ,  table slides.   shoulder bands    Consulted and Agree with Plan of Care  Patient       Patient will benefit from skilled therapeutic intervention in order to improve the  following deficits and impairments:  Decreased mobility, Hypomobility, Increased muscle spasms, Pain, Postural dysfunction, Impaired UE functional use, Impaired flexibility, Increased fascial restricitons, Decreased strength, Decreased range of motion, Decreased skin integrity, Increased edema  Visit Diagnosis: Localized edema  Chronic right shoulder pain  Acute pain of right shoulder  Muscle weakness (generalized)  Stiffness of right shoulder, not elsewhere  classified     Problem List Patient Active Problem List   Diagnosis Date Noted  . S/P right rotator cuff repair 02/02/2018  . Nontraumatic complete tear of right rotator cuff 11/15/2017  . Chronic right shoulder pain 11/15/2017  . Acute pain of right shoulder 10/18/2017    Korena Nass 04/06/2018, 1:03 PM  [facility] [address] [place], [place], [address] Phone: [phone]   Fax:  [phone]  Name: Caleb [name] MRN: [id number] Date of Birth: 04/29/1951  Raeford Razor, PT 04/06/18 1:03 PM Phone: [phone] Fax: [phone]   PHYSICAL THERAPY DISCHARGE SUMMARY  Visits from Start of Care: 18  Current functional level related to goals / functional outcomes: See above    Remaining deficits: ROM, strength, mobility, pain    Education / Equipment: HEP, posture, RICE,   Plan: Patient agrees to discharge.  Patient goals were met. Patient is being discharged due to being pleased with the current functional level.  ?????    Raeford Razor, PT 04/06/18 1:03 PM Phone: [phone] Fax: [phone]

## 2018-04-06 NOTE — Addendum Note (Signed)
Addended by: Karie Mainland L on: 04/06/2018 01:07 PM   Modules accepted: Orders

## 2018-05-03 ENCOUNTER — Encounter (INDEPENDENT_AMBULATORY_CARE_PROVIDER_SITE_OTHER): Payer: Self-pay | Admitting: Orthopaedic Surgery

## 2018-05-03 ENCOUNTER — Ambulatory Visit (INDEPENDENT_AMBULATORY_CARE_PROVIDER_SITE_OTHER): Payer: BC Managed Care – PPO | Admitting: Orthopaedic Surgery

## 2018-05-03 DIAGNOSIS — Z9889 Other specified postprocedural states: Secondary | ICD-10-CM

## 2018-05-03 NOTE — Progress Notes (Signed)
Office Visit Note   Patient: Caleb [name]           Date of Birth: [birth year]           MRN: [id number] Visit Date: 05/03/2018              Requested by: Clovis Riley, L.August Saucer, MD [address] [place], [place] [address] PCP: Clovis Riley, L.August Saucer, MD   Assessment & Plan: Visit Diagnoses:  1. S/P right rotator cuff repair     Plan: He will continue to work on range of motion daily of the right shoulder and strengthening every other day.  Told him he has some discomfort pain for up to a year.  Follow-up with [place] on as-needed basis.  Questions were encouraged and answered at length.  He will return if there is any concerns.  Follow-Up Instructions: Return if symptoms worsen or fail to improve.   Orders:  No orders of the defined types were placed in this encounter.  No orders of the defined types were placed in this encounter.     Procedures: No procedures performed   Clinical Data: No additional findings.   Subjective: No chief complaint on file.   HPI Mr. Foister status post right shoulder arthroscopy with arthroscopic assisted rotator cuff repair July 2019.  Patient states that shoulder injection he was given by Dr. Magnus Ivan on 04/05/2018 helped with the pain range of motion the shoulder.  He continues to work on range of motion strengthening shoulder.  Feels overall he is doing well.  He has some limits to motion and some mild discomfort. Review of Systems Denies any fevers chills  Objective: Vital Signs: There were no vitals taken for this visit.  Physical Exam  Constitutional: He is oriented to person, place, and time. He appears well-developed and well-nourished. No distress.  Pulmonary/Chest: Effort normal.  Neurological: He is alert and oriented to person, place, and time.  Skin: He is not diaphoretic.    Ortho Exam Right shoulder is active nearly full forward flexion passively and bring down at 80 degrees.  He has 5 out of 5 strength in  external and internal rotation against resistance bilaterally.  Empty can test is negative bilaterally.  He has slightly limited internal rotation of the right shoulder. Specialty Comments:  No specialty comments available.  Imaging: No results found.   PMFS History: Patient Active Problem List   Diagnosis Date Noted  . S/P right rotator cuff repair 02/02/2018  . Nontraumatic complete tear of right rotator cuff 11/15/2017  . Chronic right shoulder pain 11/15/2017  . Acute pain of right shoulder 10/18/2017   Past Medical History:  Diagnosis Date  . High cholesterol   . Type II diabetes mellitus ([facility])     Family History  Problem Relation Age of Onset  . Cancer Mother   . Cancer Father   . Cancer Brother     Past Surgical History:  Procedure Laterality Date  . LEFT HEART CATHETERIZATION WITH CORONARY ANGIOGRAM N/A 05/04/2013   Procedure: LEFT HEART CATHETERIZATION WITH CORONARY ANGIOGRAM;  Surgeon: Pamella Pert, MD;  Location: [facility] CATH LAB;  Service: Cardiovascular;  Laterality: N/A;  . NO PAST SURGERIES     Social History   Occupational History  . Not on file  Tobacco Use  . Smoking status: Former Smoker    Packs/day: 0.12    Years: 20.00    Pack years: 2.40    Types: Cigarettes  . Smokeless tobacco: Never Used  .  Tobacco comment: 05/04/2013 "quit smoking ~ 1991"  Substance and Sexual Activity  . Alcohol use: No  . Drug use: No  . Sexual activity: Never

## 2018-05-03 NOTE — Progress Notes (Signed)
6 

## 2018-07-12 HISTORY — PX: ROTATOR CUFF REPAIR: SHX139

## 2018-09-20 DIAGNOSIS — R198 Other specified symptoms and signs involving the digestive system and abdomen: Secondary | ICD-10-CM | POA: Diagnosis not present

## 2018-09-20 DIAGNOSIS — R079 Chest pain, unspecified: Secondary | ICD-10-CM | POA: Diagnosis not present

## 2018-10-05 DIAGNOSIS — E119 Type 2 diabetes mellitus without complications: Secondary | ICD-10-CM | POA: Diagnosis not present

## 2018-10-05 DIAGNOSIS — E7800 Pure hypercholesterolemia, unspecified: Secondary | ICD-10-CM | POA: Diagnosis not present

## 2018-10-05 DIAGNOSIS — R079 Chest pain, unspecified: Secondary | ICD-10-CM | POA: Diagnosis not present

## 2018-10-05 DIAGNOSIS — J309 Allergic rhinitis, unspecified: Secondary | ICD-10-CM | POA: Diagnosis not present

## 2019-05-03 DIAGNOSIS — E7800 Pure hypercholesterolemia, unspecified: Secondary | ICD-10-CM | POA: Diagnosis not present

## 2019-05-03 DIAGNOSIS — E119 Type 2 diabetes mellitus without complications: Secondary | ICD-10-CM | POA: Diagnosis not present

## 2019-05-03 DIAGNOSIS — J309 Allergic rhinitis, unspecified: Secondary | ICD-10-CM | POA: Diagnosis not present

## 2019-05-03 DIAGNOSIS — Z0000 Encounter for general adult medical examination without abnormal findings: Secondary | ICD-10-CM | POA: Diagnosis not present

## 2019-08-10 ENCOUNTER — Emergency Department (HOSPITAL_COMMUNITY): Payer: No Typology Code available for payment source

## 2019-08-10 ENCOUNTER — Encounter (HOSPITAL_COMMUNITY): Payer: Self-pay

## 2019-08-10 ENCOUNTER — Emergency Department (HOSPITAL_COMMUNITY)
Admission: EM | Admit: 2019-08-10 | Discharge: 2019-08-10 | Disposition: A | Discharge status: Home / Self Care | Payer: No Typology Code available for payment source | Attending: Emergency Medicine | Admitting: Emergency Medicine

## 2019-08-10 ENCOUNTER — Other Ambulatory Visit: Payer: Self-pay

## 2019-08-10 DIAGNOSIS — R519 Headache, unspecified: Secondary | ICD-10-CM | POA: Diagnosis not present

## 2019-08-10 DIAGNOSIS — Y9389 Activity, other specified: Secondary | ICD-10-CM | POA: Insufficient documentation

## 2019-08-10 DIAGNOSIS — M545 Low back pain: Secondary | ICD-10-CM | POA: Insufficient documentation

## 2019-08-10 DIAGNOSIS — R0789 Other chest pain: Secondary | ICD-10-CM

## 2019-08-10 DIAGNOSIS — S3992XA Unspecified injury of lower back, initial encounter: Secondary | ICD-10-CM | POA: Diagnosis not present

## 2019-08-10 DIAGNOSIS — M546 Pain in thoracic spine: Secondary | ICD-10-CM | POA: Insufficient documentation

## 2019-08-10 DIAGNOSIS — R0781 Pleurodynia: Secondary | ICD-10-CM | POA: Diagnosis not present

## 2019-08-10 DIAGNOSIS — R079 Chest pain, unspecified: Secondary | ICD-10-CM | POA: Diagnosis not present

## 2019-08-10 DIAGNOSIS — S299XXA Unspecified injury of thorax, initial encounter: Secondary | ICD-10-CM | POA: Diagnosis not present

## 2019-08-10 DIAGNOSIS — M542 Cervicalgia: Secondary | ICD-10-CM | POA: Insufficient documentation

## 2019-08-10 DIAGNOSIS — Z7984 Long term (current) use of oral hypoglycemic drugs: Secondary | ICD-10-CM | POA: Diagnosis not present

## 2019-08-10 DIAGNOSIS — S0990XA Unspecified injury of head, initial encounter: Secondary | ICD-10-CM | POA: Diagnosis not present

## 2019-08-10 DIAGNOSIS — E119 Type 2 diabetes mellitus without complications: Secondary | ICD-10-CM | POA: Diagnosis not present

## 2019-08-10 DIAGNOSIS — Y92410 Unspecified street and highway as the place of occurrence of the external cause: Secondary | ICD-10-CM | POA: Diagnosis not present

## 2019-08-10 DIAGNOSIS — Y999 Unspecified external cause status: Secondary | ICD-10-CM | POA: Insufficient documentation

## 2019-08-10 DIAGNOSIS — S199XXA Unspecified injury of neck, initial encounter: Secondary | ICD-10-CM | POA: Diagnosis not present

## 2019-08-10 MED ORDER — LIDOCAINE 5 % EX PTCH
1.0000 | MEDICATED_PATCH | Freq: Once | CUTANEOUS | Status: DC
  Administered 2019-08-10: 13:00:00 1 via TRANSDERMAL
  Filled 2019-08-10: qty 1

## 2019-08-10 MED ORDER — ACETAMINOPHEN 325 MG PO TABS
650.0000 mg | ORAL_TABLET | Freq: Once | ORAL | Status: AC
  Administered 2019-08-10: 13:00:00 650 mg via ORAL
  Filled 2019-08-10: qty 2
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
## 2019-08-10 NOTE — ED Notes (Signed)
Pt verbalizes understanding of DC instructions. Pt belongings returned and is ambulatory out of ED.  

## 2019-08-14 DIAGNOSIS — M542 Cervicalgia: Secondary | ICD-10-CM | POA: Diagnosis not present

## 2019-09-06 ENCOUNTER — Ambulatory Visit: Payer: BC Managed Care – PPO | Attending: Internal Medicine

## 2019-09-06 DIAGNOSIS — Z23 Encounter for immunization: Secondary | ICD-10-CM

## 2019-09-06 NOTE — Progress Notes (Signed)
   Covid-19 Vaccination Clinic  Name:  Ransom Nickson    MRN: [id number] DOB: Feb 05, 1951  09/06/2019  Mr. Thielen was observed post Covid-19 immunization for 15 minutes without incidence. He was provided with Vaccine Information Sheet and instruction to access the V-Safe system.   Mr. Garraway was instructed to call 911 with any severe reactions post vaccine: Marland Kitchen Difficulty breathing  . Swelling of your face and throat  . A fast heartbeat  . A bad rash all over your body  . Dizziness and weakness    Immunizations Administered    Name Date Dose VIS Date Route   Pfizer COVID-19 Vaccine 09/06/2019  8:38 AM 0.3 mL 06/22/2019 Intramuscular   Manufacturer: [employer], [employer]   Lot: [id number]   NDC: [address]-6

## 2019-09-26 ENCOUNTER — Ambulatory Visit: Payer: Medicare Other | Attending: Internal Medicine

## 2019-09-26 DIAGNOSIS — Z23 Encounter for immunization: Secondary | ICD-10-CM

## 2019-09-26 NOTE — Progress Notes (Signed)
   Covid-19 Vaccination Clinic  Name:  Caleb [name]    MRN: [id number] DOB: 25-Nov-1950  09/26/2019  Caleb [name] was observed post Covid-19 immunization for 15 minutes without incident. He was provided with Vaccine Information Sheet and instruction to access the V-Safe system.   Caleb [name] was instructed to call 911 with any severe reactions post vaccine: Marland Kitchen Difficulty breathing  . Swelling of face and throat  . A fast heartbeat  . A bad rash all over body  . Dizziness and weakness   Immunizations Administered    Name Date Dose VIS Date Route   Pfizer COVID-19 Vaccine 09/26/2019  9:25 AM 0.3 mL 06/22/2019 Intramuscular   Manufacturer: [employer], [employer]   Lot: [id number]   NDC: [address]-9

## 2019-11-09 DIAGNOSIS — E119 Type 2 diabetes mellitus without complications: Secondary | ICD-10-CM | POA: Diagnosis not present

## 2019-11-09 DIAGNOSIS — R972 Elevated prostate specific antigen [PSA]: Secondary | ICD-10-CM | POA: Diagnosis not present

## 2019-11-09 DIAGNOSIS — E7800 Pure hypercholesterolemia, unspecified: Secondary | ICD-10-CM | POA: Diagnosis not present

## 2019-11-09 DIAGNOSIS — J309 Allergic rhinitis, unspecified: Secondary | ICD-10-CM | POA: Diagnosis not present

## 2019-12-05 DIAGNOSIS — M25562 Pain in left knee: Secondary | ICD-10-CM | POA: Diagnosis not present

## 2020-03-13 DIAGNOSIS — M545 Low back pain: Secondary | ICD-10-CM | POA: Diagnosis not present

## 2020-03-13 DIAGNOSIS — R079 Chest pain, unspecified: Secondary | ICD-10-CM | POA: Diagnosis not present

## 2020-03-13 DIAGNOSIS — M79606 Pain in leg, unspecified: Secondary | ICD-10-CM | POA: Diagnosis not present

## 2020-03-27 ENCOUNTER — Ambulatory Visit (INDEPENDENT_AMBULATORY_CARE_PROVIDER_SITE_OTHER): Payer: Medicare Other | Admitting: Orthopaedic Surgery

## 2020-03-27 ENCOUNTER — Ambulatory Visit: Payer: Self-pay

## 2020-03-27 ENCOUNTER — Encounter: Payer: Self-pay | Admitting: Orthopaedic Surgery

## 2020-03-27 ENCOUNTER — Other Ambulatory Visit: Payer: Self-pay

## 2020-03-27 DIAGNOSIS — M79605 Pain in left leg: Secondary | ICD-10-CM | POA: Diagnosis not present

## 2020-03-27 DIAGNOSIS — M25562 Pain in left knee: Secondary | ICD-10-CM | POA: Diagnosis not present

## 2020-03-27 MED ORDER — METHYLPREDNISOLONE ACETATE 40 MG/ML IJ SUSP
40.0000 mg | INTRAMUSCULAR | Status: AC | PRN
  Administered 2020-03-27: 40 mg via INTRA_ARTICULAR

## 2020-03-27 MED ORDER — LIDOCAINE HCL 1 % IJ SOLN
3.0000 mL | INTRAMUSCULAR | Status: AC | PRN
  Administered 2020-03-27: 3 mL

## 2020-03-27 NOTE — Progress Notes (Signed)
Office Visit Note   Patient: Caleb [name]           Date of Birth: 16-Dec-1950           MRN: [id number] Visit Date: 03/27/2020              Requested by: Clovis Riley, L.August Saucer, MD [address] [place],  [place] [address] PCP: Clovis Riley, L.August Saucer, MD   Assessment & Plan: Visit Diagnoses:  1. Pain in left leg     Plan: Based on the pain that he is feeling around his knee, I do feel it is appropriate to try steroid injection in his left knee and he agrees with this treatment plan.  I did describe the risk of steroid injections as a relates to his diabetes.  All questions and concerns were answered addressed.  He tolerated the steroid injection well.  We will see him back in 4 weeks as he is doing overall.  Follow-Up Instructions: Return in about 4 weeks (around 04/24/2020).   Orders:  Orders Placed This Encounter  Procedures  . Large Joint Inj  . XR KNEE 3 VIEW LEFT  . XR Lumbar Spine 2-3 Views   No orders of the defined types were placed in this encounter.     Procedures: Large Joint Inj: L knee on 03/27/2020 2:42 PM Indications: diagnostic evaluation and pain Details: 22 G 1.5 in needle, superolateral approach  Arthrogram: No  Medications: 3 mL lidocaine 1 %; 40 mg methylPREDNISolone acetate 40 MG/ML Outcome: tolerated well, no immediate complications Procedure, treatment alternatives, risks and benefits explained, specific risks discussed. Consent was given by the patient. Immediately prior to procedure a time out was called to verify the correct patient, procedure, equipment, support staff and site/side marked as required. Patient was prepped and draped in the usual sterile fashion.       Clinical Data: No additional findings.   Subjective: Chief Complaint  Patient presents with  . Left Leg - Pain  The patient is a very pleasant 69 year old gentleman who comes in with a several month history of left knee pain.  He says it hurts on both medial lateral  aspect of his knee.  He tried diclofenac and that did not help much.  He wondered if it was from his statin medications causing the thigh knee pain and he stopped those for several weeks but this did not alleviate his symptoms.  He does get pain that wakes him up at night.  He had some low back pain.  He denies any groin pain.  He is a diabetic but reports good control and denies numbness and tingling in his feet or weakness in his legs.  He does walk about an hour every morning and it does cause him to have pain in his knee and distal thigh during his walks.  Again it does wake him up at night.  He has had no other acute changes in medical status.  He denies any pain in his right knee and this is only his left knee that is bothering him.  HPI  Review of Systems He currently denies any headache, chest pain, shortness of breath, fever, chills, nausea, vomiting  Objective: Vital Signs: There were no vitals taken for this visit.  Physical Exam He is alert and orient x3 and in no acute distress Ortho Exam Examination of his left knee shows no effusion.  He has normal-appearing alignment.  His range of motion is full with a negative McMurray's and negative  Lachman's exam.  His hip exam on the left side is normal he has negative straight leg raise and excellent strength and normal sensation throughout the leg and foot on the left side. Specialty Comments:  No specialty comments available.  Imaging: XR KNEE 3 VIEW LEFT  Result Date: 03/27/2020 3 views of the left knee show no acute findings.  The joint space is still well maintained in all 3 compartments  XR Lumbar Spine 2-3 Views  Result Date: 03/27/2020 2 views lumbar spine show no acute findings.  The alignment and disc spaces are all well-maintained.    PMFS History: Patient Active Problem List   Diagnosis Date Noted  . S/P right rotator cuff repair 02/02/2018  . Nontraumatic complete tear of right rotator cuff 11/15/2017  . Chronic  right shoulder pain 11/15/2017  . Acute pain of right shoulder 10/18/2017   Past Medical History:  Diagnosis Date  . High cholesterol   . Type II diabetes mellitus (HCC)     Family History  Problem Relation Age of Onset  . Cancer Mother   . Cancer Father   . Cancer Brother     Past Surgical History:  Procedure Laterality Date  . LEFT HEART CATHETERIZATION WITH CORONARY ANGIOGRAM N/A 05/04/2013   Procedure: LEFT HEART CATHETERIZATION WITH CORONARY ANGIOGRAM;  Surgeon: Pamella Pert, MD;  Location: [facility] CATH LAB;  Service: Cardiovascular;  Laterality: N/A;  . NO PAST SURGERIES     Social History   Occupational History  . Not on file  Tobacco Use  . Smoking status: Former Smoker    Packs/day: 0.12    Years: 20.00    Pack years: 2.40    Types: Cigarettes  . Smokeless tobacco: Never Used  . Tobacco comment: 05/04/2013 "quit smoking ~ 1991"  Substance and Sexual Activity  . Alcohol use: No  . Drug use: No  . Sexual activity: Never

## 2020-04-22 DIAGNOSIS — M545 Low back pain, unspecified: Secondary | ICD-10-CM | POA: Diagnosis not present
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
## 2020-04-28 ENCOUNTER — Ambulatory Visit: Payer: Medicare Other | Admitting: Orthopaedic Surgery

## 2020-04-28 ENCOUNTER — Encounter: Payer: Self-pay | Admitting: Orthopaedic Surgery

## 2020-04-28 DIAGNOSIS — M25562 Pain in left knee: Secondary | ICD-10-CM

## 2020-04-28 NOTE — Progress Notes (Signed)
The patient comes in today a few weeks after I injected his left knee due to generalized pain.  He is 69 years old very active.  His x-rays do not show any significant findings.  He says he is doing well and has no problems and no pain with his knee.  He denies any locking catching with his left knee.  Examination of his left knee is entirely normal.  Since he has done so well follow-up can be as needed.  If he does experience knee issues again he knows to let us know.  All questions and concerns were answered and addressed.

## 2020-05-06 DIAGNOSIS — M545 Low back pain, unspecified: Secondary | ICD-10-CM | POA: Diagnosis not present

## 2020-05-26 DIAGNOSIS — Z23 Encounter for immunization: Secondary | ICD-10-CM | POA: Diagnosis not present

## 2020-06-18 ENCOUNTER — Other Ambulatory Visit (HOSPITAL_COMMUNITY): Payer: Self-pay | Admitting: Internal Medicine

## 2020-06-18 ENCOUNTER — Ambulatory Visit: Payer: No Typology Code available for payment source | Attending: Internal Medicine

## 2020-06-18 DIAGNOSIS — Z23 Encounter for immunization: Secondary | ICD-10-CM

## 2020-06-18 NOTE — Progress Notes (Signed)
   Covid-19 Vaccination Clinic  Name:  Caleb [name]    MRN: [id number] DOB: 07-29-1950  06/18/2020  Mr. Bean was observed post Covid-19 immunization for 15 minutes without incident. He was provided with Vaccine Information Sheet and instruction to access the V-Safe system.   Vaccinated by [facility]  Mr. Sturdy was instructed to call 911 with any severe reactions post vaccine: Marland Kitchen Difficulty breathing  . Swelling of face and throat  . A fast heartbeat  . A bad rash all over body  . Dizziness and weakness   Immunizations Administered    Name Date Dose VIS Date Route   Pfizer COVID-19 Vaccine 06/18/2020  9:33 AM 0.3 mL 04/30/2020 Intramuscular   Manufacturer: [employer], [employer]   Lot: [id number]   NDC: [address]-2

## 2020-06-30 DIAGNOSIS — Z0000 Encounter for general adult medical examination without abnormal findings: Secondary | ICD-10-CM | POA: Diagnosis not present

## 2020-06-30 DIAGNOSIS — M150 Primary generalized (osteo)arthritis: Secondary | ICD-10-CM | POA: Diagnosis not present

## 2020-06-30 DIAGNOSIS — E1169 Type 2 diabetes mellitus with other specified complication: Secondary | ICD-10-CM | POA: Diagnosis not present

## 2020-06-30 DIAGNOSIS — E7800 Pure hypercholesterolemia, unspecified: Secondary | ICD-10-CM | POA: Diagnosis not present

## 2020-08-20 DIAGNOSIS — Z111 Encounter for screening for respiratory tuberculosis: Secondary | ICD-10-CM | POA: Diagnosis not present

## 2020-09-25 DIAGNOSIS — L309 Dermatitis, unspecified: Secondary | ICD-10-CM | POA: Diagnosis not present

## 2020-09-25 DIAGNOSIS — R079 Chest pain, unspecified: Secondary | ICD-10-CM | POA: Diagnosis not present

## 2020-12-11 ENCOUNTER — Emergency Department (HOSPITAL_COMMUNITY)
Admission: EM | Admit: 2020-12-11 | Discharge: 2020-12-11 | Disposition: A | Discharge status: Home / Self Care | Payer: Medicare Other | Attending: Emergency Medicine | Admitting: Emergency Medicine

## 2020-12-11 ENCOUNTER — Encounter (HOSPITAL_COMMUNITY): Payer: Self-pay | Admitting: Emergency Medicine

## 2020-12-11 ENCOUNTER — Ambulatory Visit (HOSPITAL_COMMUNITY)
Admission: EM | Admit: 2020-12-11 | Discharge: 2020-12-11 | Disposition: A | Discharge status: Home / Self Care | Payer: Medicare Other

## 2020-12-11 ENCOUNTER — Emergency Department (HOSPITAL_COMMUNITY): Payer: Medicare Other

## 2020-12-11 ENCOUNTER — Encounter (HOSPITAL_COMMUNITY): Payer: Self-pay

## 2020-12-11 ENCOUNTER — Other Ambulatory Visit: Payer: Self-pay

## 2020-12-11 DIAGNOSIS — R42 Dizziness and giddiness: Secondary | ICD-10-CM | POA: Insufficient documentation

## 2020-12-11 DIAGNOSIS — E785 Hyperlipidemia, unspecified: Secondary | ICD-10-CM

## 2020-12-11 DIAGNOSIS — E119 Type 2 diabetes mellitus without complications: Secondary | ICD-10-CM | POA: Insufficient documentation

## 2020-12-11 DIAGNOSIS — R6889 Other general symptoms and signs: Secondary | ICD-10-CM

## 2020-12-11 DIAGNOSIS — Z87891 Personal history of nicotine dependence: Secondary | ICD-10-CM | POA: Diagnosis not present

## 2020-12-11 DIAGNOSIS — R0789 Other chest pain: Secondary | ICD-10-CM | POA: Insufficient documentation

## 2020-12-11 DIAGNOSIS — E1169 Type 2 diabetes mellitus with other specified complication: Secondary | ICD-10-CM

## 2020-12-11 DIAGNOSIS — R079 Chest pain, unspecified: Secondary | ICD-10-CM

## 2020-12-11 DIAGNOSIS — Z955 Presence of coronary angioplasty implant and graft: Secondary | ICD-10-CM | POA: Insufficient documentation

## 2020-12-11 DIAGNOSIS — Z7984 Long term (current) use of oral hypoglycemic drugs: Secondary | ICD-10-CM

## 2020-12-11 LAB — CBC
HCT: 41.9 % (ref 39.0–52.0)
Hemoglobin: 13.8 g/dL (ref 13.0–17.0)
MCH: 26 pg (ref 26.0–34.0)
MCHC: 32.9 g/dL (ref 30.0–36.0)
MCV: 78.9 fL — ABNORMAL LOW (ref 80.0–100.0)
Platelets: 312 10*3/uL (ref 150–400)
RBC: 5.31 MIL/uL (ref 4.22–5.81)
RDW: 17.9 % — ABNORMAL HIGH (ref 11.5–15.5)
WBC: 6.3 10*3/uL (ref 4.0–10.5)
nRBC: 0 % (ref 0.0–0.2)

## 2020-12-11 LAB — BASIC METABOLIC PANEL
Anion gap: 7 (ref 5–15)
BUN: 10 mg/dL (ref 8–23)
CO2: 28 mmol/L (ref 22–32)
Calcium: 9.3 mg/dL (ref 8.9–10.3)
Chloride: 103 mmol/L (ref 98–111)
Creatinine, Ser: 0.94 mg/dL (ref 0.61–1.24)
GFR, Estimated: >60 mL/min (ref >60)
Glucose, Bld: 205 mg/dL — ABNORMAL HIGH (ref 70–99)
Potassium: 4.1 mmol/L (ref 3.5–5.1)
Sodium: 138 mmol/L (ref 135–145)

## 2020-12-11 LAB — TROPONIN I (HIGH SENSITIVITY)
Troponin I (High Sensitivity): 6 ng/L (ref <18)
Troponin I (High Sensitivity): 6 ng/L (ref <18)

## 2020-12-11 MED ORDER — CYCLOBENZAPRINE HCL 5 MG PO TABS: 5.0000 mg | ORAL_TABLET | Freq: Two times a day (BID) | ORAL | 0 refills | Status: DC

## 2020-12-11 NOTE — ED Provider Notes (Signed)
[facility] EMERGENCY DEPARTMENT Provider Note   CSN: [id number] Arrival date & time: 12/11/20  1035     History Chief Complaint  Patient presents with  . Chest Pain    Bland Rudzinski is a 70 y.o. male.  Patient is a 70 year old male who presents with chest pain.  He had an episode yesterday that went away and then another one started about 8:00 this morning and lasted until about 1030.  He describes it is a sharp squeezing pain.  Its in his left chest.  Its nonradiating.  Nonexertional.  No nausea or vomiting.  No diaphoresis.  No shortness of breath.  He says that his eased off now and he currently denies any chest discomfort.  No known history of heart problems.  He was having a headache yesterday which he attributes to smelling some natural gas in his home.  The gas company did come out and check things and there was no gas leak.  His headache has resolved.  He denies any other recent illnesses.  No cough or cold symptoms.  No fevers.  He did have an episode about 4 to 5 days ago when he was driving he became lightheaded and fell like he was going to pass out.  He denies any sensation of palpitations.  It resolved when he took his hat off and rested for a minute.  He has had no further similar events.     HPI: A 70 year old patient with a history of treated diabetes and hypercholesterolemia presents for evaluation of chest pain. Initial onset of pain was approximately 3-6 hours ago. The patient's chest pain is described as heaviness/pressure/tightness and is not worse with exertion. The patient's chest pain is middle- or left-sided, is not well-localized, is not sharp and does not radiate to the arms/jaw/neck. The patient does not complain of nausea and denies diaphoresis. The patient has no history of stroke, has no history of peripheral artery disease, has not smoked in the past 90 days, has no relevant family history of coronary artery disease (first degree relative at  less than age 45), is not hypertensive and does not have an elevated BMI (>=30).   Past Medical History:  Diagnosis Date  . High cholesterol   . Type II diabetes mellitus [facility])     Patient Active Problem List   Diagnosis Date Noted  . S/P right rotator cuff repair 02/02/2018  . Nontraumatic complete tear of right rotator cuff 11/15/2017  . Chronic right shoulder pain 11/15/2017  . Acute pain of right shoulder 10/18/2017    Past Surgical History:  Procedure Laterality Date  . LEFT HEART CATHETERIZATION WITH CORONARY ANGIOGRAM N/A 05/04/2013   Procedure: LEFT HEART CATHETERIZATION WITH CORONARY ANGIOGRAM;  Surgeon: Pamella Pert, MD;  Location: [facility] CATH LAB;  Service: Cardiovascular;  Laterality: N/A;  . NO PAST SURGERIES         Family History  Problem Relation Age of Onset  . Cancer Mother   . Cancer Father   . Cancer Brother     Social History   Tobacco Use  . Smoking status: Former Smoker    Packs/day: 0.12    Years: 20.00    Pack years: 2.40    Types: Cigarettes  . Smokeless tobacco: Never Used  . Tobacco comment: 05/04/2013 "quit smoking ~ 1991"  Substance Use Topics  . Alcohol use: No  . Drug use: No    Home Medications Prior to Admission medications   Medication Sig Start Date  End Date Taking? Authorizing Provider  calcium carbonate (OS-CAL) 1250 (500 Ca) MG chewable tablet Chew 1 tablet by mouth daily.   Yes [provider]  cyclobenzaprine (FLEXERIL) 5 MG tablet Take 1 tablet (5 mg total) by mouth 2 (two) times daily. 12/11/20  Yes Wallis Bamberg, PA-C  ferrous sulfate 324 MG TBEC Take 324 mg by mouth daily with breakfast.   Yes [provider]  fexofenadine (ALLEGRA) 180 MG tablet Take 180 mg by mouth daily.  04/13/16  Yes [provider]  glimepiride (AMARYL) 2 MG tablet Take 2 mg by mouth daily. 12/09/20  Yes [provider]  metFORMIN (GLUCOPHAGE) 1000 MG tablet Take 1 tablet by mouth 2 (two) times daily. 04/19/16  Yes  [provider]  Multiple Vitamins-Minerals (MULTIVITAMIN WITH MINERALS) tablet Take 1 tablet by mouth daily.   Yes [provider]  rosuvastatin (CRESTOR) 10 MG tablet Take 10 mg by mouth 3 (three) times a week. 09/11/20  Yes [provider]  vitamin B-12 (CYANOCOBALAMIN) 500 MCG tablet Take 2 tablets by mouth daily. 11/22/20  Yes [provider]  COVID-19 mRNA vaccine, Pfizer, 30 MCG/0.3ML injection INJECT AS DIRECTED 06/18/20 06/18/21  Judyann Munson, MD  diclofenac (VOLTAREN) 50 MG EC tablet Take 1 tablet (50 mg total) by mouth 2 (two) times daily. Patient not taking: No sig reported 09/18/17   Janne Napoleon, NP  nabumetone (RELAFEN) 500 MG tablet Take 1 tablet (500 mg total) by mouth 2 (two) times daily as needed. Patient not taking: Reported on 12/11/2020 11/15/17   Kathryne Hitch, MD    Allergies    Simvastatin  Review of Systems   Review of Systems  Constitutional: Negative for chills, diaphoresis, fatigue and fever.  HENT: Negative for congestion, rhinorrhea and sneezing.   Eyes: Negative.   Respiratory: Negative for cough, chest tightness and shortness of breath.   Cardiovascular: Positive for chest pain. Negative for leg swelling.  Gastrointestinal: Negative for abdominal pain, blood in stool, diarrhea, nausea and vomiting.  Genitourinary: Negative for difficulty urinating, flank pain, frequency and hematuria.  Musculoskeletal: Negative for arthralgias and back pain.  Skin: Negative for rash.  Neurological: Negative for dizziness, speech difficulty, weakness, numbness and headaches.    Physical Exam Updated Vital Signs BP (!) 152/76   Pulse 72   Temp 98.2 F (36.8 C) (Oral)   Resp 19   Ht 5\' 7"  (1.702 m)   Wt 86.2 kg   SpO2 98%   BMI 29.76 kg/m   Physical Exam Constitutional:      Appearance: He is well-developed.  HENT:     Head: Normocephalic and atraumatic.  Eyes:     Pupils: Pupils are equal, round, and reactive to  light.  Cardiovascular:     Rate and Rhythm: Normal rate and regular rhythm.     Heart sounds: Normal heart sounds.  Pulmonary:     Effort: Pulmonary effort is normal. No respiratory distress.     Breath sounds: Normal breath sounds. No wheezing or rales.  Chest:     Chest wall: No tenderness.  Abdominal:     General: Bowel sounds are normal.     Palpations: Abdomen is soft.     Tenderness: There is no abdominal tenderness. There is no guarding or rebound.  Musculoskeletal:        General: Normal range of motion.     Cervical back: Normal range of motion and neck supple.     Comments: No edema or calf  tenderness  Lymphadenopathy:     Cervical: No cervical adenopathy.  Skin:    General: Skin is warm and dry.     Findings: No rash.  Neurological:     Mental Status: He is alert and oriented to person, place, and time.     ED Results / Procedures / Treatments   Labs (all labs ordered are listed, but only abnormal results are displayed) Labs Reviewed  BASIC METABOLIC PANEL - Abnormal; Notable for the following components:      Result Value   Glucose, Bld 205 (*)    All other components within normal limits  CBC - Abnormal; Notable for the following components:   MCV 78.9 (*)    RDW 17.9 (*)    All other components within normal limits  TROPONIN I (HIGH SENSITIVITY)  TROPONIN I (HIGH SENSITIVITY)    EKG EKG Interpretation  Date/Time:  Thursday December 11 2020 10:42:35 EDT Ventricular Rate:  78 PR Interval:  176 QRS Duration: 86 QT Interval:  344 QTC Calculation: 392 R Axis:   42 Text Interpretation: Normal sinus rhythm Normal ECG similar to prior EKGs Confirmed by Rolan Bucco [phone]) on 12/11/2020 10:57:01 AM   Radiology DG Chest 2 View  Result Date: 12/11/2020 CLINICAL DATA:  Left upper chest pain EXAM: CHEST - 2 VIEW COMPARISON:  08/10/2019 FINDINGS: The heart size and mediastinal contours are within normal limits. Both lungs are clear. The visualized skeletal  structures are unremarkable. Trachea midline.  Nonobstructive bowel gas pattern. On the lateral view, there is layering hyperattenuation anteriorly in the abdomen, nonspecific. This may be related to ingested bowel content or possibly gallstones. IMPRESSION: No active cardiopulmonary disease. Electronically Signed   By: Judie Petit.  Shick M.D.   On: 12/11/2020 11:09    Procedures Procedures   Medications Ordered in ED Medications - No data to display  ED Course  I have reviewed the triage vital signs and the nursing notes.  Pertinent labs & imaging results that were available during my care of the patient were reviewed by me and considered in my medical decision making (see chart for details).    MDM Rules/Calculators/A&P HEAR Score: 4                        Patient is a 70 year old male who presents with chest pain.  He states its sharp in nature and feels like little pricks.  He does not have any associated symptoms.  He has not had any chest pain since arrival to the ED.  He has had 2 negative troponins.  No exertional symptoms.  No known coronary artery disease.  Reportedly had a cath a few years ago that was normal but I cannot find a record of that.  His other labs are nonconcerning.  His chest x-ray is clear without evidence of pneumonia or pneumothorax.  He has a borderline heart score.  However given his lack of other associated symptoms and atypical nature of the pain, feel that patient can be discharged with close follow-up.  He was discharged home in good condition.  He was advised to have close follow-up with his PCP.  He was given strict return precautions.  These were discussed in detail with him. Final Clinical Impression(s) / ED Diagnoses Final diagnoses:  Nonspecific chest pain    Rx / DC Orders ED Discharge Orders    None       Rolan Bucco, MD 12/11/20 1530

## 2020-12-11 NOTE — ED Provider Notes (Addendum)
[facility]   MRN: [id number] DOB: [birth year]  Subjective:   Daveyon Kitchings is a 70 y.o. male with PMH of hyperlipidemia, type 2 diabetes presenting for 1 day history of acute onset constant left medial upper chest pain that is mild to moderate in nature.  Patient currently rates pain 2 out of 10 but has been worse overnight.  Has also had some soreness about the back of his neck, throat congested.  Denies fever, runny or stuffy nose, cough, shortness of breath, diaphoresis, nausea, vomiting, abdominal pain, radiation of his chest pain to the back or left arm, abdomen, trauma, fall.  Has not done excessive lifting including at work or through exercise.  Has not used any medications for relief.  Denies history of MI.  He has previously had heart catheterization several years ago but was negative.  He does have a PCP but has not been able to get in touch with them.  He is compliant with his medication for his diabetes and cholesterol.  Denies smoking cigarettes.  No current facility-administered medications for this encounter.  Current Outpatient Medications:  .  aspirin EC 81 MG tablet, Take 81 mg by mouth daily., Disp: , Rfl:  .  COVID-19 mRNA vaccine, Pfizer, 30 MCG/0.3ML injection, INJECT AS DIRECTED, Disp: .3 mL, Rfl: 0 .  diclofenac (VOLTAREN) 50 MG EC tablet, Take 1 tablet (50 mg total) by mouth 2 (two) times daily. (Patient not taking: Reported on 03/28/2018), Disp: 15 tablet, Rfl: 0 .  fexofenadine (ALLEGRA) 180 MG tablet, Take 180 mg by mouth daily. , Disp: , Rfl: 5 .  glimepiride (AMARYL) 2 MG tablet, Take 2 mg by mouth daily., Disp: , Rfl:  .  metFORMIN (GLUCOPHAGE) 1000 MG tablet, Take 1 tablet by mouth 2 (two) times daily., Disp: , Rfl: 3 .  nabumetone (RELAFEN) 500 MG tablet, Take 1 tablet (500 mg total) by mouth 2 (two) times daily as needed., Disp: 60 tablet, Rfl: 1 .  pravastatin (PRAVACHOL) 40 MG tablet, Take 40 mg by mouth at bedtime., Disp: , Rfl:  .   rosuvastatin (CRESTOR) 10 MG tablet, Take 10 mg by mouth 3 (three) times a week., Disp: , Rfl:    Allergies  Allergen Reactions  . Simvastatin     Fatigue and myalgias    Past Medical History:  Diagnosis Date  . High cholesterol   . Type II diabetes mellitus (HCC)      Past Surgical History:  Procedure Laterality Date  . LEFT HEART CATHETERIZATION WITH CORONARY ANGIOGRAM N/A 05/04/2013   Procedure: LEFT HEART CATHETERIZATION WITH CORONARY ANGIOGRAM;  Surgeon: Pamella Pert, MD;  Location: [facility] CATH LAB;  Service: Cardiovascular;  Laterality: N/A;  . NO PAST SURGERIES      Family History  Problem Relation Age of Onset  . Cancer Mother   . Cancer Father   . Cancer Brother     Social History   Tobacco Use  . Smoking status: Former Smoker    Packs/day: 0.12    Years: 20.00    Pack years: 2.40    Types: Cigarettes  . Smokeless tobacco: Never Used  . Tobacco comment: 05/04/2013 "quit smoking ~ 1991"  Substance Use Topics  . Alcohol use: No  . Drug use: No    ROS   Objective:   Vitals: BP (!) 152/79 (BP Location: Right Arm)   Pulse 74   Temp 98.1 F (36.7 C) (Oral)   Resp 18   SpO2 97%  Physical Exam Constitutional:      General: He is not in acute distress.    Appearance: Normal appearance. He is well-developed. He is not ill-appearing, toxic-appearing or diaphoretic.  HENT:     Head: Normocephalic and atraumatic.     Right Ear: External ear normal.     Left Ear: External ear normal.     Nose: Nose normal.     Mouth/Throat:     Mouth: Mucous membranes are moist.     Pharynx: Oropharynx is clear. No oropharyngeal exudate or posterior oropharyngeal erythema.  Eyes:     General: No scleral icterus.    Extraocular Movements: Extraocular movements intact.     Pupils: Pupils are equal, round, and reactive to light.  Cardiovascular:     Rate and Rhythm: Normal rate and regular rhythm.     Heart sounds: Normal heart sounds. No murmur heard. No friction  rub. No gallop.   Pulmonary:     Effort: Pulmonary effort is normal. No respiratory distress.     Breath sounds: Normal breath sounds. No stridor. No wheezing, rhonchi or rales.  Chest:     Chest wall: Tenderness present. No mass, lacerations, deformity, swelling, crepitus or edema. There is no dullness to percussion.    Abdominal:     General: Bowel sounds are normal. There is no distension.     Palpations: Abdomen is soft. There is no mass.     Tenderness: There is no abdominal tenderness. There is no guarding or rebound.  Skin:    General: Skin is warm and dry.  Neurological:     Mental Status: He is alert and oriented to person, place, and time.  Psychiatric:        Mood and Affect: Mood normal.        Behavior: Behavior normal.        Thought Content: Thought content normal.        Judgment: Judgment normal.     ED ECG REPORT   Date: 12/11/2020  Rate: 75bpm  Rhythm: normal sinus rhythm  QRS Axis: normal  Intervals: normal  ST/T Wave abnormalities: nonspecific T wave changes  Conduction Disutrbances:none  Narrative Interpretation: Sinus rhythm at 75 bpm with nonspecific T wave flattening in lead III only.  EKG is very comparable to 05/09/2016, his last EKG.  No signs of an acute process.  Old EKG Reviewed: unchanged  I have personally reviewed the EKG tracing and agree with the computerized printout as noted.   Assessment and Plan :   PDMP not reviewed this encounter.  1. Chest wall pain   2. Left-sided chest pain   3. Throat congestion   4. Dyslipidemia associated with type 2 diabetes mellitus (HCC)     Not entirely convinced that this is musculoskeletal chest wall pain given his physical exam findings.  Discussed differential which does include a heart event and given his risk factors including diabetes, high cholesterol, age, gender and race recommended further evaluation through the emergency room for further heart work-up.  Otherwise, patient to use Tylenol  and Flexeril, follow-up very closely with his PCP.  Contracts for safety and states that he will go to the ER now.  As he does not have severe chest pain, is hemodynamically stable patient will go to the emergency room via personal vehicle.   Wallis Bamberg, PA-C 12/11/20 1028

## 2020-12-11 NOTE — ED Triage Notes (Signed)
Pt in with c/o left side cp that started yesterday.  Pt denies any radiation to arm/jaw/back, sob, N/V, dizziness  Pt also c/o headache and congestion

## 2020-12-11 NOTE — ED Triage Notes (Signed)
Pt presents from UC with c/o of intermittent left chest pain. Denies radiation, no sob, N/V. He also reports a headache since yesterday after smelling natural gas at his home, it has lingered but subsided now.

## 2020-12-11 NOTE — Discharge Instructions (Signed)
Please report to the emergency room for further testing to make sure your heart is okay. In the meantime, you can use Tylenol at a dose of 500mg -650mg  once every 6 hours as needed for your aches, pains, fevers. Do not use any nonsteroidal anti-inflammatories (NSAIDs) like ibuprofen, Motrin, naproxen, Aleve, etc. which are all available over-the-counter. You can also use cyclobenzaprine to help with your pain. Please follow up with your PCP as soon as possible for further recommendations.

## 2020-12-11 NOTE — Discharge Instructions (Addendum)
Follow-up with your doctor within the next few days for recheck.  Return here as needed if you have any worsening symptoms.

## 2020-12-15 DIAGNOSIS — R079 Chest pain, unspecified: Secondary | ICD-10-CM | POA: Diagnosis not present

## 2020-12-23 ENCOUNTER — Ambulatory Visit: Payer: BC Managed Care – PPO

## 2020-12-23 ENCOUNTER — Encounter: Payer: Self-pay | Admitting: Cardiology

## 2020-12-23 ENCOUNTER — Other Ambulatory Visit: Payer: Self-pay | Admitting: Cardiology

## 2020-12-23 ENCOUNTER — Ambulatory Visit: Payer: BC Managed Care – PPO | Admitting: Cardiology

## 2020-12-23 ENCOUNTER — Other Ambulatory Visit: Payer: Self-pay

## 2020-12-23 VITALS — BP 160/80 | HR 85 | Temp 98.0°F | Resp 16 | Ht 68.0 in | Wt 199.0 lb

## 2020-12-23 DIAGNOSIS — I10 Essential (primary) hypertension: Secondary | ICD-10-CM

## 2020-12-23 DIAGNOSIS — Z87891 Personal history of nicotine dependence: Secondary | ICD-10-CM

## 2020-12-23 DIAGNOSIS — E1165 Type 2 diabetes mellitus with hyperglycemia: Secondary | ICD-10-CM

## 2020-12-23 DIAGNOSIS — R072 Precordial pain: Secondary | ICD-10-CM | POA: Diagnosis not present

## 2020-12-23 DIAGNOSIS — G4730 Sleep apnea, unspecified: Secondary | ICD-10-CM | POA: Insufficient documentation

## 2020-12-23 DIAGNOSIS — E782 Mixed hyperlipidemia: Secondary | ICD-10-CM

## 2020-12-23 MED ORDER — LOSARTAN POTASSIUM 25 MG PO TABS: 25.0000 mg | ORAL_TABLET | Freq: Every morning | ORAL | 0 refills | Status: DC

## 2020-12-23 NOTE — Progress Notes (Signed)
Date:  12/23/2020   ID:  [name], DOB 14-Dec-1950, MRN [id number]  PCP:  Alroy Dust, L.Marlou Sa, MD  Cardiologist:  Rex Kras, DO, [facility] (established care 12/23/2020)  REASON FOR CONSULT: Chest pain  REQUESTING PHYSICIAN:  Alroy Dust, L.Marlou Sa, [place] [address] [place],  [place] [address]  Chief Complaint  Patient presents with   Chest Pain    HPI  Caleb [name] is a 70 y.o. male who presents to the office with a chief complaint of " chest pain." Patient's past medical history and cardiovascular risk factors include: Hypertension, hypercholesterolemia, type 2 diabetes, sleep apnea on CPAP, family history of colon cancer, former smoker, advance age.   He is referred to the office at the request of Alroy Dust, L.Marlou Sa, MD for evaluation of chest pain.  Chest pain: Patient presents today for evaluation of chest pain.  The initial event happened approximately 3 weeks ago in May 2022.  The pain was located on the right anterior chest wall, felt like a pressure-like sensation, lasting for a few minutes, occurring randomly and self-limited.  The pain is not brought on by effort related activities and does not resolve with rest.  Given his age and risk factors patient was concern and eventually went to the ER for further evaluation and management.  In the ER his high sensitive troponins were negative x2 and was asked to follow-up with PCP.  He now presents to cardiology for further evaluation and assessment.  Patient is currently chest pain-free.  He is quite active for his age but does not participate in any structured exercise program.  He has not experienced decreased physical endurance.  Incidentally patient is found to have an elevated blood pressure at today's office visit initial blood pressure was 172/97 and a repeat blood pressure was 160/80 mmHg.  Based on the PCP notes from 06/30/2020 blood pressure was 148/90.  He does not check his blood pressures at home but has  ordered a blood pressure cuff and will do so in the near future.  No family history of premature coronary disease or sudden cardiac death.  FUNCTIONAL STATUS: Patient remains active for his age but no structured exercise program or daily routine.  ALLERGIES: Allergies  Allergen Reactions   Simvastatin     Fatigue and myalgias    MEDICATION LIST PRIOR TO VISIT: Current Meds  Medication Sig   calcium carbonate (OS-CAL) 1250 (500 Ca) MG chewable tablet Chew 1 tablet by mouth daily.   COVID-19 mRNA vaccine, Pfizer, 30 MCG/0.3ML injection INJECT AS DIRECTED   cyclobenzaprine (FLEXERIL) 5 MG tablet Take 1 tablet (5 mg total) by mouth 2 (two) times daily.   diclofenac (VOLTAREN) 50 MG EC tablet Take 1 tablet (50 mg total) by mouth 2 (two) times daily.   ferrous sulfate 324 MG TBEC Take 324 mg by mouth daily with breakfast.   fexofenadine (ALLEGRA) 180 MG tablet Take 180 mg by mouth daily.    glimepiride (AMARYL) 2 MG tablet Take 2 mg by mouth daily.   metFORMIN (GLUCOPHAGE) 1000 MG tablet Take 1 tablet by mouth 2 (two) times daily.   Multiple Vitamins-Minerals (MULTIVITAMIN WITH MINERALS) tablet Take 1 tablet by mouth daily.   rosuvastatin (CRESTOR) 10 MG tablet Take 10 mg by mouth 3 (three) times a week.   vitamin B-12 (CYANOCOBALAMIN) 500 MCG tablet Take 2 tablets by mouth daily.     PAST MEDICAL HISTORY: Past Medical History:  Diagnosis Date   High cholesterol    Sleep apnea  Type II diabetes mellitus ([place])     PAST SURGICAL HISTORY: Past Surgical History:  Procedure Laterality Date   LEFT HEART CATHETERIZATION WITH CORONARY ANGIOGRAM N/A 05/04/2013   Procedure: LEFT HEART CATHETERIZATION WITH CORONARY ANGIOGRAM;  Surgeon: Laverda Page, MD;  Location: [facility] CATH LAB;  Service: Cardiovascular;  Laterality: N/A;   NO PAST SURGERIES     ROTATOR CUFF REPAIR Right 2020    FAMILY HISTORY: The patient family history includes Cancer in his brother, father, and mother; Diabetes  in his brother, brother, sister, and sister; Hypertension in his brother, sister, and sister.  SOCIAL HISTORY:  The patient  reports that he quit smoking about 42 years ago. His smoking use included cigarettes. He has a 2.40 pack-year smoking history. He has never used smokeless tobacco. He reports that he does not drink alcohol and does not use drugs.  REVIEW OF SYSTEMS: Review of Systems  Constitutional: Negative for chills and fever.  HENT:  Negative for hoarse voice and nosebleeds.   Eyes:  Negative for discharge, double vision and pain.  Cardiovascular:  Positive for chest pain (no active pain). Negative for claudication, dyspnea on exertion, leg swelling, near-syncope, orthopnea, palpitations, paroxysmal nocturnal dyspnea and syncope.  Respiratory:  Negative for hemoptysis and shortness of breath.   Musculoskeletal:  Negative for muscle cramps and myalgias.  Gastrointestinal:  Negative for abdominal pain, constipation, diarrhea, hematemesis, hematochezia, melena, nausea and vomiting.  Neurological:  Negative for dizziness and light-headedness.   PHYSICAL EXAM: Vitals with BMI 12/23/2020 12/23/2020 12/11/2020  Height - _0  -  Weight - 199 lbs -  BMI - 77.41 -  Systolic 287 867 672  Diastolic 97 90 76  Pulse 85 85 72    CONSTITUTIONAL: Well-developed and well-nourished. No acute distress.  SKIN: Skin is warm and dry. No rash noted. No cyanosis. No pallor. No jaundice HEAD: Normocephalic and atraumatic.  EYES: No scleral icterus MOUTH/THROAT: Moist oral membranes.  NECK: No JVD present. No thyromegaly noted. No carotid bruits  LYMPHATIC: No visible cervical adenopathy.  CHEST Normal respiratory effort. No intercostal retractions  LUNGS: Clear to auscultation bilaterally. No stridor. No wheezes. No rales.  CARDIOVASCULAR: Regular rate and rhythm, positive S1-S2, no murmurs rubs or gallops appreciated. ABDOMINAL: No apparent ascites.  EXTREMITIES: No peripheral edema   HEMATOLOGIC: No significant bruising NEUROLOGIC: Oriented to person, place, and time. Nonfocal. Normal muscle tone.  PSYCHIATRIC: Normal mood and affect. Normal behavior. Cooperative  CARDIAC DATABASE: EKG: 12/23/2020: Normal sinus rhythm, 85 bpm, poor R wave progression, left atrial enlargement, without underlying injury pattern.  Echocardiogram: No results found for this or any previous visit from the past 1095 days.   Stress Testing: No results found for this or any previous visit from the past 1095 days.  Heart Catheterization: 2014: Normal coronary arteries with left dominant circulation with normal left systolic function.  LABORATORY DATA: CBC Latest Ref Rng & Units 12/11/2020 05/08/2016 05/04/2013  WBC 4.0 - 10.5 K/uL 6.3 6.2 5.1  Hemoglobin 13.0 - 17.0 g/dL 13.8 13.5 14.0  Hematocrit 39.0 - 52.0 % 41.9 40.2 41.1  Platelets 150 - 400 K/uL 312 296 258    CMP Latest Ref Rng & Units 12/11/2020 05/08/2016 05/04/2013  Glucose 70 - 99 mg/dL 205(H) 203(H) 185(H)  BUN 8 - 23 mg/dL _1 Creatinine 0.61 - 1.24 mg/dL 0.94 0.88 0.87  Sodium 135 - 145 mmol/L 138 136 134(L)  Potassium 3.5 - 5.1 mmol/L 4.1 3.7 4.0  Chloride 98 - 111  mmol/L 103 105 100  CO2 22 - 32 mmol/L _0 Calcium 8.9 - 10.3 mg/dL 9.3 9.0 9.4  Total Protein 6.0 - 8.3 g/dL - - -  Total Bilirubin 0.3 - 1.2 mg/dL - - -  Alkaline Phos 39 - 117 U/L - - -  AST 0 - 37 U/L - - -  ALT 0 - 53 U/L - - -    Lipid Panel     Component Value Date/Time   CHOL  02/05/2009 0520    147        ATP III CLASSIFICATION:  <200     mg/dL   Desirable  200-239  mg/dL   Borderline High  >=240    mg/dL   High          TRIG 145 02/05/2009 0520   HDL 24 (L) 02/05/2009 0520   CHOLHDL 6.1 02/05/2009 0520   VLDL 29 02/05/2009 0520   LDLCALC  02/05/2009 0520    94        Total Cholesterol/HDL:CHD Risk Coronary Heart Disease Risk Table                     Men   Women  1/2 Average Risk   3.4   3.3  Average Risk       5.0    4.4  2 X Average Risk   9.6   7.1  3 X Average Risk  23.4   11.0        Use the calculated Patient Ratio above and the CHD Risk Table to determine the patient's CHD Risk.        ATP III CLASSIFICATION (LDL):  <100     mg/dL   Optimal  100-129  mg/dL   Near or Above                    Optimal  130-159  mg/dL   Borderline  160-189  mg/dL   High  >190     mg/dL   Very High    No components found for: NTPROBNP No results for input(s): PROBNP in the last 8760 hours. No results for input(s): TSH in the last 8760 hours.  BMP Recent Labs    12/11/20 1045  NA 138  K 4.1  CL 103  CO2 28  GLUCOSE 205*  BUN 10  CREATININE 0.94  CALCIUM 9.3  GFRNONAA >60    HEMOGLOBIN A1C Lab Results  Component Value Date   HGBA1C 6.1 (H) 05/04/2013   MPG 128 (H) 05/04/2013    External Labs: Collected: 07/02/2020 Creatinine 0.83 mg/dL. eGFR: >60 mL/min per 1.73 m Potassium level: 4 Lipid profile: Total cholesterol 160, triglycerides 98, HDL 41, LDL 101, non-HDL 119 Hemoglobin A1c: 7.3  IMPRESSION:    ICD-10-CM   1. Precordial pain  R07.2 EKG 12-Lead    2. Benign hypertension  I10     3. Mixed hyperlipidemia  E78.2     4. Type 2 diabetes mellitus with hyperglycemia, without long-term current use of insulin (HCC)  E11.65     5. Former smoker  Z87.891        RECOMMENDATIONS: Caleb [name] is a 70 y.o. male whose past medical history and cardiac risk factors include: Hypertension, hypercholesterolemia, type 2 diabetes, sleep apnea on CPAP, family history of colon cancer, former smoker, advance age.   Precordial pain: His symptoms are not completely cardiac in etiology. However he has multiple cardiovascular risk factors. His estimated  10-year risk of ASCVD is 38.7%. High sensitive troponins negative x2 and recent ER documentation reviewed. The shared decision was to proceed with an ischemic evaluation given his age, risk factors, and estimated 10-year risk of ASCVD to be  greater than 20%. Echocardiogram will be ordered to evaluate for structural heart disease and left ventricular systolic function. Plan exercise nuclear stress test to evaluate for functional status and reversible ischemia.  Benign essential hypertension: Patient's blood pressure reading at today's office visit remained elevated (left arm 160/80 and right arm 164/62mHG) We will start losartan 25 mg p.o. every morning -until he gets a chance to see his PCP. Blood work in 1 week to evaluate kidney function and electrolytes. Low-salt diet recommended. No focal neurological deficits.  Non-insulin-dependent diabetes mellitus type 2: Currently on metformin.  Most recent hemoglobin A1c reviewed.  Former smoker: Educated on the importance of continued smoking cessation.   FINAL MEDICATION LIST END OF ENCOUNTER: No orders of the defined types were placed in this encounter.   Medications Discontinued During This Encounter  Medication Reason   nabumetone (RELAFEN) 500 MG tablet Error     Current Outpatient Medications:    calcium carbonate (OS-CAL) 1250 (500 Ca) MG chewable tablet, Chew 1 tablet by mouth daily., Disp: , Rfl:    COVID-19 mRNA vaccine, Pfizer, 30 MCG/0.3ML injection, INJECT AS DIRECTED, Disp: .3 mL, Rfl: 0   cyclobenzaprine (FLEXERIL) 5 MG tablet, Take 1 tablet (5 mg total) by mouth 2 (two) times daily., Disp: 30 tablet, Rfl: 0   diclofenac (VOLTAREN) 50 MG EC tablet, Take 1 tablet (50 mg total) by mouth 2 (two) times daily., Disp: 15 tablet, Rfl: 0   ferrous sulfate 324 MG TBEC, Take 324 mg by mouth daily with breakfast., Disp: , Rfl:    fexofenadine (ALLEGRA) 180 MG tablet, Take 180 mg by mouth daily. , Disp: , Rfl: 5   glimepiride (AMARYL) 2 MG tablet, Take 2 mg by mouth daily., Disp: , Rfl:    metFORMIN (GLUCOPHAGE) 1000 MG tablet, Take 1 tablet by mouth 2 (two) times daily., Disp: , Rfl: 3   Multiple Vitamins-Minerals (MULTIVITAMIN WITH MINERALS) tablet, Take 1 tablet by  mouth daily., Disp: , Rfl:    rosuvastatin (CRESTOR) 10 MG tablet, Take 10 mg by mouth 3 (three) times a week., Disp: , Rfl:    vitamin B-12 (CYANOCOBALAMIN) 500 MCG tablet, Take 2 tablets by mouth daily., Disp: , Rfl:   Orders Placed This Encounter  Procedures   EKG 12-Lead    There are no Patient Instructions on file for this visit.   --Continue cardiac medications as reconciled in final medication list. --No follow-ups on file. Or sooner if needed. --Continue follow-up with your primary care physician regarding the management of your other chronic comorbid conditions.  Patient's questions and concerns were addressed to his satisfaction. He voices understanding of the instructions provided during this encounter.   This note was created using a voice recognition software as a result there may be grammatical errors inadvertently enclosed that do not reflect the nature of this encounter. Every attempt is made to correct such errors.  SRex Kras DNevada FGrossmont Hospital Pager: [phone]: [phone]

## 2020-12-24 LAB — BASIC METABOLIC PANEL
BUN/Creatinine Ratio: 10 (ref 10–24)
BUN: 8 mg/dL (ref 8–27)
CO2: 22 mmol/L (ref 20–29)
Calcium: 9.9 mg/dL (ref 8.6–10.2)
Chloride: 102 mmol/L (ref 96–106)
Creatinine, Ser: 0.84 mg/dL (ref 0.76–1.27)
Glucose: 120 mg/dL — ABNORMAL HIGH (ref 65–99)
Potassium: 4.7 mmol/L (ref 3.5–5.2)
Sodium: 140 mmol/L (ref 134–144)
eGFR: 95 mL/min/{1.73_m2} (ref >59)

## 2020-12-24 LAB — MAGNESIUM: Magnesium: 2 mg/dL (ref 1.6–2.3)

## 2020-12-25 ENCOUNTER — Other Ambulatory Visit: Payer: Self-pay

## 2020-12-25 DIAGNOSIS — R072 Precordial pain: Secondary | ICD-10-CM

## 2020-12-25 DIAGNOSIS — I10 Essential (primary) hypertension: Secondary | ICD-10-CM

## 2020-12-29 DIAGNOSIS — E1169 Type 2 diabetes mellitus with other specified complication: Secondary | ICD-10-CM | POA: Diagnosis not present

## 2020-12-29 DIAGNOSIS — E7800 Pure hypercholesterolemia, unspecified: Secondary | ICD-10-CM | POA: Diagnosis not present

## 2020-12-29 DIAGNOSIS — I10 Essential (primary) hypertension: Secondary | ICD-10-CM | POA: Diagnosis not present

## 2020-12-29 DIAGNOSIS — J309 Allergic rhinitis, unspecified: Secondary | ICD-10-CM | POA: Diagnosis not present

## 2020-12-29 NOTE — Progress Notes (Signed)
Called pt no answer °

## 2020-12-29 NOTE — Progress Notes (Signed)
Pt aware.

## 2021-01-04 NOTE — Progress Notes (Signed)
External Labs: Collected: 12/29/2020 Sodium 139, potassium 4.9, chloride 105, bicarb 30. BUN 10, creatinine 0.89 Hemoglobin A1c 6.9  MA: Please refer the patient that his labs remained stable after the initiation of losartan.  Continue current medical therapy.  We will discuss at the next office visit.

## 2021-01-05 NOTE — Progress Notes (Signed)
Called pt no answer, left a vm to return call back.

## 2021-01-05 NOTE — Progress Notes (Signed)
Pt aware.

## 2021-01-16 ENCOUNTER — Other Ambulatory Visit (HOSPITAL_COMMUNITY): Payer: Self-pay

## 2021-01-19 ENCOUNTER — Ambulatory Visit: Payer: BC Managed Care – PPO

## 2021-01-19 ENCOUNTER — Other Ambulatory Visit: Payer: Self-pay

## 2021-01-19 DIAGNOSIS — R072 Precordial pain: Secondary | ICD-10-CM | POA: Diagnosis not present

## 2021-01-20 DIAGNOSIS — M940 Chondrocostal junction syndrome [Tietze]: Secondary | ICD-10-CM | POA: Diagnosis not present

## 2021-01-20 DIAGNOSIS — R42 Dizziness and giddiness: Secondary | ICD-10-CM | POA: Diagnosis not present

## 2021-01-23 ENCOUNTER — Ambulatory Visit: Payer: Self-pay | Admitting: Cardiology

## 2021-01-30 ENCOUNTER — Ambulatory Visit: Payer: BC Managed Care – PPO | Admitting: Cardiology

## 2021-01-30 ENCOUNTER — Other Ambulatory Visit: Payer: Self-pay

## 2021-01-30 ENCOUNTER — Encounter: Payer: Self-pay | Admitting: Cardiology

## 2021-01-30 VITALS — BP 143/81 | HR 83 | Temp 97.8°F | Resp 16 | Ht 68.0 in | Wt 200.8 lb

## 2021-01-30 DIAGNOSIS — I10 Essential (primary) hypertension: Secondary | ICD-10-CM

## 2021-01-30 DIAGNOSIS — I340 Nonrheumatic mitral (valve) insufficiency: Secondary | ICD-10-CM | POA: Diagnosis not present

## 2021-01-30 DIAGNOSIS — R072 Precordial pain: Secondary | ICD-10-CM

## 2021-01-30 DIAGNOSIS — E782 Mixed hyperlipidemia: Secondary | ICD-10-CM

## 2021-01-30 DIAGNOSIS — E1165 Type 2 diabetes mellitus with hyperglycemia: Secondary | ICD-10-CM

## 2021-01-30 DIAGNOSIS — Z87891 Personal history of nicotine dependence: Secondary | ICD-10-CM

## 2021-01-30 MED ORDER — HYDROCHLOROTHIAZIDE 25 MG PO TABS: 25.0000 mg | ORAL_TABLET | Freq: Every morning | ORAL | 0 refills | Status: DC

## 2021-01-30 MED ORDER — LOSARTAN POTASSIUM 50 MG PO TABS: 50.0000 mg | ORAL_TABLET | Freq: Every day | ORAL | 2 refills | Status: DC

## 2021-01-30 NOTE — Progress Notes (Signed)
Date:  01/30/2021   ID:  [name], DOB [birth year], MRN [id number]  PCP:  Aurea Graff.Marlou Sa, MD  Cardiologist:  Rex Kras, DO, [facility] (established care 12/23/2020)  Date: 01/30/21 Last Office Visit: 12/23/2020  Chief Complaint  Patient presents with   Precordial pain   Follow-up    HPI  Caleb [name] is a 70 y.o. male who presents to the office with a chief complaint of " re-evaluation of chest pain." Patient's past medical history and cardiovascular risk factors include: Hypertension, hypercholesterolemia, type 2 diabetes, sleep apnea on CPAP, family history of colon cancer, former smoker, advance age.   He is referred to the office at the request of Alroy Dust, L.Marlou Sa, MD for evaluation of chest pain.  At the last office visit patient presented for consultation for evaluation and management of chest pain.  His symptoms were not completely cardiac in etiology but given his multiple cardiovascular risk factors including diabetes mellitus type 2 and an estimated 10-year risk of ASCVD of approximately 38% and the shared decision was to proceed with an ischemic evaluation.  Since last office visit patient denies any chest pain at rest or with effort related activities.  He is overall euvolemic and not in congestive heart failure.  He underwent an echocardiogram which noted preserved LVEF, grade 1 diastolic impairment and mild/moderate valvular heart disease as noted below.  Stress test overall a low risk study.  FUNCTIONAL STATUS: Patient remains active for his age but no structured exercise program or daily routine.  ALLERGIES: Allergies  Allergen Reactions   Simvastatin     Fatigue and myalgias    MEDICATION LIST PRIOR TO VISIT: Current Meds  Medication Sig   calcium carbonate (OS-CAL) 1250 (500 Ca) MG chewable tablet Chew 1 tablet by mouth daily.   COVID-19 mRNA vaccine, Pfizer, 30 MCG/0.3ML injection INJECT AS DIRECTED   diclofenac (VOLTAREN) 75 MG EC tablet Take 75  mg by mouth 2 (two) times daily.   ferrous sulfate 324 MG TBEC Take 324 mg by mouth daily with breakfast.   fexofenadine (ALLEGRA) 180 MG tablet Take 180 mg by mouth daily.    glimepiride (AMARYL) 2 MG tablet Take 2 mg by mouth daily.   hydrochlorothiazide (HYDRODIURIL) 25 MG tablet Take 1 tablet (25 mg total) by mouth every morning.   losartan (COZAAR) 50 MG tablet Take 1 tablet (50 mg total) by mouth daily.   metFORMIN (GLUCOPHAGE) 1000 MG tablet Take 1 tablet by mouth 2 (two) times daily.   Multiple Vitamins-Minerals (MULTIVITAMIN WITH MINERALS) tablet Take 1 tablet by mouth daily.   rosuvastatin (CRESTOR) 10 MG tablet Take 10 mg by mouth 3 (three) times a week.   vitamin B-12 (CYANOCOBALAMIN) 500 MCG tablet Take 2 tablets by mouth daily.   [DISCONTINUED] losartan (COZAAR) 25 MG tablet Take 1 tablet (25 mg total) by mouth every morning.     PAST MEDICAL HISTORY: Past Medical History:  Diagnosis Date   High cholesterol    Sleep apnea    Type II diabetes mellitus ([place])     PAST SURGICAL HISTORY: Past Surgical History:  Procedure Laterality Date   LEFT HEART CATHETERIZATION WITH CORONARY ANGIOGRAM N/A 05/04/2013   Procedure: LEFT HEART CATHETERIZATION WITH CORONARY ANGIOGRAM;  Surgeon: Laverda Page, MD;  Location: [facility] CATH LAB;  Service: Cardiovascular;  Laterality: N/A;   NO PAST SURGERIES     ROTATOR CUFF REPAIR Right 2020    FAMILY HISTORY: The patient family history includes Cancer in his brother, father, and mother; Diabetes  in his brother, brother, sister, and sister; Hypertension in his brother, sister, and sister.  SOCIAL HISTORY:  The patient  reports that he quit smoking about 42 years ago. His smoking use included cigarettes. He has a 2.40 pack-year smoking history. He has never used smokeless tobacco. He reports that he does not drink alcohol and does not use drugs.  REVIEW OF SYSTEMS: Review of Systems  Constitutional: Negative for chills and fever.  HENT:   Negative for hoarse voice and nosebleeds.   Eyes:  Negative for discharge, double vision and pain.  Cardiovascular:  Negative for chest pain, claudication, dyspnea on exertion, leg swelling, near-syncope, orthopnea, palpitations, paroxysmal nocturnal dyspnea and syncope.  Respiratory:  Negative for hemoptysis and shortness of breath.   Musculoskeletal:  Negative for muscle cramps and myalgias.  Gastrointestinal:  Negative for abdominal pain, constipation, diarrhea, hematemesis, hematochezia, melena, nausea and vomiting.  Neurological:  Negative for dizziness and light-headedness.   PHYSICAL EXAM: Vitals with BMI 01/30/2021 01/30/2021 12/23/2020  Height - _0  -  Weight - 200 lbs 13 oz -  BMI - 09.98 -  Systolic 338 250 539  Diastolic 81 84 80  Pulse 83 88 85    CONSTITUTIONAL: Well-developed and well-nourished. No acute distress.  SKIN: Skin is warm and dry. No rash noted. No cyanosis. No pallor. No jaundice HEAD: Normocephalic and atraumatic.  EYES: No scleral icterus MOUTH/THROAT: Moist oral membranes.  NECK: No JVD present. No thyromegaly noted. No carotid bruits  LYMPHATIC: No visible cervical adenopathy.  CHEST Normal respiratory effort. No intercostal retractions  LUNGS: Clear to auscultation bilaterally. No stridor. No wheezes. No rales.  CARDIOVASCULAR: Regular rate and rhythm, positive S1-S2, no murmurs rubs or gallops appreciated. ABDOMINAL: No apparent ascites.  EXTREMITIES: No peripheral edema  HEMATOLOGIC: No significant bruising NEUROLOGIC: Oriented to person, place, and time. Nonfocal. Normal muscle tone.  PSYCHIATRIC: Normal mood and affect. Normal behavior. Cooperative  CARDIAC DATABASE: EKG: 12/23/2020: Normal sinus rhythm, 85 bpm, poor R wave progression, left atrial enlargement, without underlying injury pattern.  Echocardiogram: 12/23/2020: Left ventricle cavity is normal in size. Moderate concentric hypertrophy of the left ventricle. Normal global wall  motion. Normal LV systolic function with EF 55%. Doppler evidence of grade I (impaired) diastolic dysfunction, normal LAP. Trileaflet aortic valve. Mild (Grade I) aortic regurgitation. Moderate (Grade II) mitral regurgitation. Normal right atrial pressure.   Stress Testing: Exercise Sestamibi Stress Test 01/19/2021: Normal ECG stress. The patient exercised for 7 minutes and 26 seconds of a Bruce protocol, achieving approximately 9.26 METs. Normal BP. Peak EKG/ECG revealed no ST-T wave abnormalities. During exercise the peak ECG revealed no arrhythmias. Exercise capacity low. Myocardial perfusion is normal. Overall LV systolic function is normal without regional wall motion abnormalities. Stress LV EF: 67%. No previous exam available for comparison. Low risk.  Heart Catheterization: 2014: Normal coronary arteries with left dominant circulation with normal left systolic function.  LABORATORY DATA: CBC Latest Ref Rng & Units 12/11/2020 05/08/2016 05/04/2013  WBC 4.0 - 10.5 K/uL 6.3 6.2 5.1  Hemoglobin 13.0 - 17.0 g/dL 13.8 13.5 14.0  Hematocrit 39.0 - 52.0 % 41.9 40.2 41.1  Platelets 150 - 400 K/uL 312 296 258    CMP Latest Ref Rng & Units 12/23/2020 12/11/2020 05/08/2016  Glucose 65 - 99 mg/dL 120(H) 205(H) 203(H)  BUN 8 - 27 mg/dL _1 Creatinine 0.76 - 1.27 mg/dL 0.84 0.94 0.88  Sodium 134 - 144 mmol/L 140 138 136  Potassium 3.5 - 5.2 mmol/L 4.7  4.1 3.7  Chloride 96 - 106 mmol/L 102 103 105  CO2 20 - 29 mmol/L _0 Calcium 8.6 - 10.2 mg/dL 9.9 9.3 9.0  Total Protein 6.0 - 8.3 g/dL - - -  Total Bilirubin 0.3 - 1.2 mg/dL - - -  Alkaline Phos 39 - 117 U/L - - -  AST 0 - 37 U/L - - -  ALT 0 - 53 U/L - - -    Lipid Panel     Component Value Date/Time   CHOL  02/05/2009 0520    147        ATP III CLASSIFICATION:  <200     mg/dL   Desirable  200-239  mg/dL   Borderline High  >=240    mg/dL   High          TRIG 145 02/05/2009 0520   HDL 24 (L) 02/05/2009 0520   CHOLHDL  6.1 02/05/2009 0520   VLDL 29 02/05/2009 0520   LDLCALC  02/05/2009 0520    94        Total Cholesterol/HDL:CHD Risk Coronary Heart Disease Risk Table                     Men   Women  1/2 Average Risk   3.4   3.3  Average Risk       5.0   4.4  2 X Average Risk   9.6   7.1  3 X Average Risk  23.4   11.0        Use the calculated Patient Ratio above and the CHD Risk Table to determine the patient's CHD Risk.        ATP III CLASSIFICATION (LDL):  <100     mg/dL   Optimal  100-129  mg/dL   Near or Above                    Optimal  130-159  mg/dL   Borderline  160-189  mg/dL   High  >190     mg/dL   Very High    No components found for: NTPROBNP No results for input(s): PROBNP in the last 8760 hours. No results for input(s): TSH in the last 8760 hours.  BMP Recent Labs    12/11/20 1045 12/23/20 1436  NA 138 140  K 4.1 4.7  CL 103 102  CO2 28 22  GLUCOSE 205* 120*  BUN 10 8  CREATININE 0.94 0.84  CALCIUM 9.3 9.9  GFRNONAA >60  --     HEMOGLOBIN A1C Lab Results  Component Value Date   HGBA1C 6.1 (H) 05/04/2013   MPG 128 (H) 05/04/2013    External Labs: Collected: 07/02/2020 Creatinine 0.83 mg/dL. eGFR: >60 mL/min per 1.73 m Potassium level: 4 Lipid profile: Total cholesterol 160, triglycerides 98, HDL 41, LDL 101, non-HDL 119 Hemoglobin A1c: 7.3  External Labs: Collected: 12/29/2020 Sodium 139, potassium 4.9, chloride 105, bicarb 30. BUN 10, creatinine 0.89 Hemoglobin A1c 6.9  IMPRESSION:    ICD-10-CM   1. Benign hypertension  I10 losartan (COZAAR) 50 MG tablet    hydrochlorothiazide (HYDRODIURIL) 25 MG tablet    Basic metabolic panel    Magnesium    2. Precordial pain  R07.2     3. Mixed hyperlipidemia  E78.2     4. Type 2 diabetes mellitus with hyperglycemia, without long-term current use of insulin (HCC)  E11.65     5. Moderate mitral regurgitation  I34.0  41. Former smoker  Z87.891        RECOMMENDATIONS: Caleb [name] is a 70  y.o. male whose past medical history and cardiac risk factors include: Hypertension, hypercholesterolemia, type 2 diabetes, sleep apnea on CPAP, family history of colon cancer, former smoker, advance age.   Precordial pain: Resolved. Reviewed the results of the echocardiogram and stress test with the patient at today's office visit. No additional cardiovascular testing recommended at this time. Educated on the importance of improving his modifiable cardiovascular risk factors  Benign essential hypertension: Started on losartan at the last office visit and has tolerated the medication well. Patient states that he does not have an upcoming office visit with his PCP and he would like our office to manage his blood pressure.   Will start hydrochlorothiazide 12.5 mg p.o. every morning. Will increase losartan to 50 mg p.o. every afternoon.   Blood work in 1 week to evaluate kidney function and electrolytes.   Low-salt diet recommended.  Non-insulin-dependent diabetes mellitus type 2: Currently on metformin.  Most recent hemoglobin A1c reviewed.  Former smoker: Educated on the importance of continued smoking cessation.   FINAL MEDICATION LIST END OF ENCOUNTER: Meds ordered this encounter  Medications   losartan (COZAAR) 50 MG tablet    Sig: Take 1 tablet (50 mg total) by mouth daily.    Dispense:  30 tablet    Refill:  2   hydrochlorothiazide (HYDRODIURIL) 25 MG tablet    Sig: Take 1 tablet (25 mg total) by mouth every morning.    Dispense:  30 tablet    Refill:  0     Medications Discontinued During This Encounter  Medication Reason   cyclobenzaprine (FLEXERIL) 5 MG tablet Error   diclofenac (VOLTAREN) 50 MG EC tablet Change in therapy   losartan (COZAAR) 25 MG tablet Dose change     Current Outpatient Medications:    calcium carbonate (OS-CAL) 1250 (500 Ca) MG chewable tablet, Chew 1 tablet by mouth daily., Disp: , Rfl:    COVID-19 mRNA vaccine, Pfizer, 30 MCG/0.3ML injection,  INJECT AS DIRECTED, Disp: .3 mL, Rfl: 0   diclofenac (VOLTAREN) 75 MG EC tablet, Take 75 mg by mouth 2 (two) times daily., Disp: , Rfl:    ferrous sulfate 324 MG TBEC, Take 324 mg by mouth daily with breakfast., Disp: , Rfl:    fexofenadine (ALLEGRA) 180 MG tablet, Take 180 mg by mouth daily. , Disp: , Rfl: 5   glimepiride (AMARYL) 2 MG tablet, Take 2 mg by mouth daily., Disp: , Rfl:    hydrochlorothiazide (HYDRODIURIL) 25 MG tablet, Take 1 tablet (25 mg total) by mouth every morning., Disp: 30 tablet, Rfl: 0   losartan (COZAAR) 50 MG tablet, Take 1 tablet (50 mg total) by mouth daily., Disp: 30 tablet, Rfl: 2   metFORMIN (GLUCOPHAGE) 1000 MG tablet, Take 1 tablet by mouth 2 (two) times daily., Disp: , Rfl: 3   Multiple Vitamins-Minerals (MULTIVITAMIN WITH MINERALS) tablet, Take 1 tablet by mouth daily., Disp: , Rfl:    rosuvastatin (CRESTOR) 10 MG tablet, Take 10 mg by mouth 3 (three) times a week., Disp: , Rfl:    vitamin B-12 (CYANOCOBALAMIN) 500 MCG tablet, Take 2 tablets by mouth daily., Disp: , Rfl:   Orders Placed This Encounter  Procedures   Basic metabolic panel   Magnesium    There are no Patient Instructions on file for this visit.   --Continue cardiac medications as reconciled in final medication list. --Return in about 3  months (around 05/02/2021) for Follow up, BP. Or sooner if needed. --Continue follow-up with your primary care physician regarding the management of your other chronic comorbid conditions.  Patient's questions and concerns were addressed to his satisfaction. He voices understanding of the instructions provided during this encounter.   This note was created using a voice recognition software as a result there may be grammatical errors inadvertently enclosed that do not reflect the nature of this encounter. Every attempt is made to correct such errors.  Rex Kras, [place], [facility]  Pager: [phone] Office: [phone]

## 2021-02-02 ENCOUNTER — Other Ambulatory Visit: Payer: Self-pay

## 2021-02-02 DIAGNOSIS — I10 Essential (primary) hypertension: Secondary | ICD-10-CM

## 2021-02-03 ENCOUNTER — Telehealth: Payer: Self-pay

## 2021-02-03 ENCOUNTER — Telehealth: Payer: Self-pay | Admitting: Cardiology

## 2021-02-03 NOTE — Telephone Encounter (Signed)
Dr. Emelda Brothers patient. Not seen by me. Forwarded to Dr. Odis Hollingshead.   Thanks MJP

## 2021-02-03 NOTE — Telephone Encounter (Signed)
PT has started the meds Dr. Rosemary Holms put him on on 01/30/21. He states that since he started the meds he stays dizzy and drowsy.  Call back number [phone] or [phone]

## 2021-02-03 NOTE — Telephone Encounter (Signed)
Called pt back due to pt called about him being dizzy after taking Losartan 50mg  and hydrochlorothiazide 25mg . Pt mention he took them this morning also, went for a walk and felt dizzy and light headed, SOB, and drowsy with LT leg numbness with pain on knee. Denies CP, fatigue, and headaches. Pt checked his bp yesterday and this morning, it was 125/70, 125/75 pulse 81. Pt mention that when taking these meds he feels like he is going to pass out when driving. Pt is no longer driving. Please advise.

## 2021-02-03 NOTE — Telephone Encounter (Signed)
Dr. Emelda Brothers patient. Not seen by me. Forwarded to Dr. Odis Hollingshead. Please check with him.  Thanks MJP

## 2021-02-03 NOTE — Telephone Encounter (Signed)
Spoke to patient he is aware

## 2021-02-03 NOTE — Telephone Encounter (Signed)
Please have him hold the blood pressure medications for the next 24 hours.  Following that he can start hydrochlorothiazide 12.5 mg in the morning and losartan 25 mg p.o. every afternoon.

## 2021-02-03 NOTE — Telephone Encounter (Signed)
Message sent by Caleb [name], Patient called that he is feeling very dizzy since starting HCTZ and Losartan. Whenever he wakes up he goes for a walk and he hasn't been able to because he feels he is going to pass out and yesterday when driving he felt like passing out. He took his bp this morning it was 125/75. Patient stated he did not feel like this before please advise

## 2021-02-04 NOTE — Telephone Encounter (Signed)
PT says he's feeling lightheaded & dizzy, says he was told to stop medication yesterday, but PT is still feeling dizzy. 7/27. LVM for Cathlean Cower [facility]

## 2021-02-05 NOTE — Telephone Encounter (Signed)
Per Dr. Odis Hollingshead pt needs to stop taking his HCTZ and losartan until feeling better and then he can start taking his HCTZ 12.5 in the morning and Losartan 25 at night. Pt understood.

## 2021-02-12 DIAGNOSIS — E559 Vitamin D deficiency, unspecified: Secondary | ICD-10-CM | POA: Diagnosis not present

## 2021-02-12 DIAGNOSIS — D508 Other iron deficiency anemias: Secondary | ICD-10-CM | POA: Diagnosis not present

## 2021-02-12 DIAGNOSIS — E538 Deficiency of other specified B group vitamins: Secondary | ICD-10-CM | POA: Diagnosis not present

## 2021-02-12 DIAGNOSIS — I10 Essential (primary) hypertension: Secondary | ICD-10-CM | POA: Diagnosis not present

## 2021-02-21 ENCOUNTER — Other Ambulatory Visit: Payer: Self-pay | Admitting: Cardiology

## 2021-02-21 DIAGNOSIS — I10 Essential (primary) hypertension: Secondary | ICD-10-CM

## 2021-02-27 DIAGNOSIS — D508 Other iron deficiency anemias: Secondary | ICD-10-CM | POA: Diagnosis not present

## 2021-02-27 DIAGNOSIS — E7800 Pure hypercholesterolemia, unspecified: Secondary | ICD-10-CM | POA: Diagnosis not present

## 2021-02-27 DIAGNOSIS — I10 Essential (primary) hypertension: Secondary | ICD-10-CM | POA: Diagnosis not present

## 2021-02-27 DIAGNOSIS — E1169 Type 2 diabetes mellitus with other specified complication: Secondary | ICD-10-CM | POA: Diagnosis not present

## 2021-03-11 ENCOUNTER — Other Ambulatory Visit: Payer: Self-pay | Admitting: Cardiology

## 2021-03-11 DIAGNOSIS — I10 Essential (primary) hypertension: Secondary | ICD-10-CM

## 2021-03-26 DIAGNOSIS — E7800 Pure hypercholesterolemia, unspecified: Secondary | ICD-10-CM | POA: Diagnosis not present

## 2021-03-26 DIAGNOSIS — E1169 Type 2 diabetes mellitus with other specified complication: Secondary | ICD-10-CM | POA: Diagnosis not present

## 2021-03-26 DIAGNOSIS — Z7984 Long term (current) use of oral hypoglycemic drugs: Secondary | ICD-10-CM | POA: Diagnosis not present

## 2021-03-26 DIAGNOSIS — I10 Essential (primary) hypertension: Secondary | ICD-10-CM | POA: Diagnosis not present

## 2021-04-25 DIAGNOSIS — Z23 Encounter for immunization: Secondary | ICD-10-CM | POA: Diagnosis not present

## 2021-04-26 ENCOUNTER — Other Ambulatory Visit: Payer: Self-pay | Admitting: Cardiology

## 2021-04-26 DIAGNOSIS — I10 Essential (primary) hypertension: Secondary | ICD-10-CM

## 2021-04-29 ENCOUNTER — Ambulatory Visit: Payer: BC Managed Care – PPO | Admitting: Cardiology

## 2021-05-11 ENCOUNTER — Ambulatory Visit: Payer: BC Managed Care – PPO | Admitting: Cardiology

## 2021-05-26 DIAGNOSIS — E7800 Pure hypercholesterolemia, unspecified: Secondary | ICD-10-CM | POA: Diagnosis not present

## 2021-05-26 DIAGNOSIS — I10 Essential (primary) hypertension: Secondary | ICD-10-CM | POA: Diagnosis not present

## 2021-05-26 DIAGNOSIS — Z7984 Long term (current) use of oral hypoglycemic drugs: Secondary | ICD-10-CM | POA: Diagnosis not present

## 2021-05-26 DIAGNOSIS — E1169 Type 2 diabetes mellitus with other specified complication: Secondary | ICD-10-CM | POA: Diagnosis not present

## 2021-07-31 DIAGNOSIS — E7800 Pure hypercholesterolemia, unspecified: Secondary | ICD-10-CM | POA: Diagnosis not present

## 2021-07-31 DIAGNOSIS — J309 Allergic rhinitis, unspecified: Secondary | ICD-10-CM | POA: Diagnosis not present

## 2021-07-31 DIAGNOSIS — E1169 Type 2 diabetes mellitus with other specified complication: Secondary | ICD-10-CM | POA: Diagnosis not present

## 2021-07-31 DIAGNOSIS — Z0000 Encounter for general adult medical examination without abnormal findings: Secondary | ICD-10-CM | POA: Diagnosis not present

## 2021-07-31 DIAGNOSIS — Z125 Encounter for screening for malignant neoplasm of prostate: Secondary | ICD-10-CM | POA: Diagnosis not present

## 2021-08-06 ENCOUNTER — Encounter: Payer: Self-pay | Admitting: Podiatrist

## 2021-08-06 ENCOUNTER — Other Ambulatory Visit: Payer: Self-pay

## 2021-08-06 ENCOUNTER — Ambulatory Visit (INDEPENDENT_AMBULATORY_CARE_PROVIDER_SITE_OTHER): Payer: Medicare Other | Admitting: Podiatrist

## 2021-08-06 ENCOUNTER — Ambulatory Visit (INDEPENDENT_AMBULATORY_CARE_PROVIDER_SITE_OTHER): Payer: Medicare Other

## 2021-08-06 DIAGNOSIS — M7742 Metatarsalgia, left foot: Secondary | ICD-10-CM

## 2021-08-06 DIAGNOSIS — R159 Full incontinence of feces: Secondary | ICD-10-CM | POA: Insufficient documentation

## 2021-08-06 DIAGNOSIS — M150 Primary generalized (osteo)arthritis: Secondary | ICD-10-CM | POA: Insufficient documentation

## 2021-08-06 DIAGNOSIS — D560 Alpha thalassemia: Secondary | ICD-10-CM | POA: Insufficient documentation

## 2021-08-06 DIAGNOSIS — M7741 Metatarsalgia, right foot: Secondary | ICD-10-CM

## 2021-08-06 DIAGNOSIS — J309 Allergic rhinitis, unspecified: Secondary | ICD-10-CM | POA: Insufficient documentation

## 2021-08-06 DIAGNOSIS — M722 Plantar fascial fibromatosis: Secondary | ICD-10-CM | POA: Diagnosis not present

## 2021-08-06 DIAGNOSIS — E559 Vitamin D deficiency, unspecified: Secondary | ICD-10-CM | POA: Insufficient documentation

## 2021-08-06 DIAGNOSIS — L309 Dermatitis, unspecified: Secondary | ICD-10-CM | POA: Insufficient documentation

## 2021-08-06 DIAGNOSIS — I10 Essential (primary) hypertension: Secondary | ICD-10-CM | POA: Insufficient documentation

## 2021-08-06 DIAGNOSIS — M6740 Ganglion, unspecified site: Secondary | ICD-10-CM | POA: Insufficient documentation

## 2021-08-06 DIAGNOSIS — R102 Pelvic and perineal pain: Secondary | ICD-10-CM | POA: Insufficient documentation

## 2021-08-06 DIAGNOSIS — E7800 Pure hypercholesterolemia, unspecified: Secondary | ICD-10-CM | POA: Insufficient documentation

## 2021-08-06 DIAGNOSIS — D509 Iron deficiency anemia, unspecified: Secondary | ICD-10-CM | POA: Insufficient documentation

## 2021-08-06 DIAGNOSIS — D573 Sickle-cell trait: Secondary | ICD-10-CM | POA: Insufficient documentation

## 2021-08-06 DIAGNOSIS — K219 Gastro-esophageal reflux disease without esophagitis: Secondary | ICD-10-CM | POA: Insufficient documentation

## 2021-08-06 DIAGNOSIS — E1169 Type 2 diabetes mellitus with other specified complication: Secondary | ICD-10-CM | POA: Insufficient documentation

## 2021-08-06 MED ORDER — TRIAMCINOLONE ACETONIDE 10 MG/ML IJ SUSP
10.0000 mg | Freq: Once | INTRAMUSCULAR | Status: AC
  Administered 2021-08-06: 10 mg

## 2021-08-06 NOTE — Progress Notes (Signed)
Chief Complaint  Patient presents with   Foot Pain    Patient presents today for bilat heel and forefeet pain x 2-3 months.  He says its a stabbing pain when he stands from sitting.  He also states his last 3 toes on right foot feel a little numb.  He has not done anything to treat pain     HPI: Patient is 71 y.o. male who presents today for painful feet left greater than right.  He has pain in the heels with the first step in the morning as well as pain on the balls of his feet.  He has tried over the counter inserts with minimal relief.  He is diabetic and has high blood pressure and relates the foot pain started after a change in medication.  He is an active walker at the [facility] prior to his feet giving him pain.     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) 08/06/2021   Type 2 diabetes mellitus with other specified complication (HCC) 08/06/2021   Vitamin D deficiency 08/06/2021   Sleep apnea 12/23/2020   S/P right rotator cuff repair 02/02/2018   Nontraumatic complete tear of right rotator cuff 11/15/2017   Chronic right shoulder pain 11/15/2017   Acute pain of right shoulder 10/18/2017   Chronic post-traumatic stress disorder 04/02/2014   Hearing loss 02/05/2014   Anxiety 11/08/2013   Ex-smoker 11/08/2013   Personal history of colonic polyps 07/13/2003    Current Outpatient Medications on File Prior to Visit  Medication Sig Dispense Refill   Cholecalciferol 25 MCG (1000 UT) tablet Take 1 tablet by mouth daily.     fluticasone (FLONASE) 50 MCG/ACT nasal  spray INSTILL 2 SPRAYS IN EACH NOSTRIL DAILY AS NEEDED     losartan (COZAAR) 50 MG tablet Take 0.5 tablets by mouth daily.     metFORMIN (GLUCOPHAGE) 1000 MG tablet TAKE ONE TABLET BY MOUTH TWICE A DAY FOR DIABETES (ANNUAL KIDNEY FUNCTION TESTING IS NEEDED)     psyllium (KONSYL) 33 % POWD TAKE 1 TEASPOONFUL BY MOUTH DAILY AS NEEDED     vitamin B-12 (CYANOCOBALAMIN) 500 MCG tablet Take 1 tablet by mouth daily.     calcium carbonate (OS-CAL) 1250 (500 Ca) MG chewable tablet Chew 1 tablet by mouth daily.     ferrous sulfate 324 MG TBEC Take 324 mg by mouth daily with breakfast.     fexofenadine (ALLEGRA) 180 MG tablet Take 180 mg by mouth daily.   5   glimepiride (AMARYL) 2 MG tablet Take 2 mg by mouth daily.     hydrochlorothiazide (HYDRODIURIL) 25 MG tablet TAKE 1 TABLET BY MOUTH EVERY DAY IN THE MORNING (Patient not taking: Reported on 08/06/2021) 90 tablet 1   Multiple Vitamins-Minerals (MULTIVITAMIN WITH MINERALS) tablet Take 1 tablet by mouth daily.     pantoprazole (PROTONIX) 40 MG tablet Take 40 mg by mouth daily as needed.     pioglitazone (ACTOS) 15 MG tablet Take 15 mg by mouth daily.     rosuvastatin (CRESTOR) 10 MG tablet Take 10 mg by mouth 3 (three) times a week.  No current facility-administered medications on file prior to visit.    Allergies  Allergen Reactions   Simvastatin     Fatigue and myalgias   Tramadol Hcl     Other reaction(s): GI upset    Review of Systems No fevers, chills, nausea, muscle aches, no difficulty breathing, no calf pain, no chest pain or shortness of breath.   Physical Exam  GENERAL APPEARANCE: Alert, conversant. Appropriately groomed. No acute distress.   VASCULAR: Pedal pulses palpable DP and PT bilateral.  Capillary refill time is immediate to all digits,  Proximal to distal cooling it warm to warm.  Digital perfusion adequate.   NEUROLOGIC: sensation is intact to 5.07 monofilament at 5/5 sites bilateral.  Light touch is intact  bilateral, vibratory sensation intact bilateral  MUSCULOSKELETAL: acceptable muscle strength, tone and stability bilateral.  No gross boney pedal deformities noted. Mild increase in arch height is noted that flattens upon standing.  Pain on palpation plantar medial calcaneus left greater than right is noted.  Moderate forefoot pain is also palpated bilateral likely compensatory in nature.   DERMATOLOGIC: skin is warm, supple, and dry.  No open lesions noted.  No rash, no pre ulcerative lesions. Digital nails are asymptomatic.    Xray:  3 views of bilateral feet are obtained. No acute osseous abnormalities present.  Small inferior calcaneal spur is present on the right heel in comparison to the left.  Decreased joint space at the first MPJ is noted bilateral as well.   Assessment     ICD-10-CM   1. Plantar fasciitis  M72.2 DG Foot Complete Left    DG Foot Complete Right    triamcinolone acetonide (KENALOG) 10 MG/ML injection 10 mg    2. Metatarsalgia of both feet  M77.41    M77.42        Plan  Treatment options and exam findings discussed with the patient.  1)  injection recommended.  See procedure note Procedure: Injection  Discussed alternatives, risks, complications and verbal consent was obtained.  Location: left plantar fascia/ heel Skin Prep: Alcohol. Injectate: 0.5cc 0.5% marcaine plain, 10 mg kenalog Disposition: Patient tolerated procedure well. Injection site dressed with a band-aid.  Post-injection care was discussed and return precautions discussed.  2) recommended powerstep inserts-  these were placed in his shoes and he relates they are comfortable.  Patient notified of cost 3) recommended voltaren gel to apply daily 4) stretching exercises dispensed 5)I anticipate symptoms will resolve in the next 3-4 weeks.  If they do not, he will call.  Also discussed positive long term benefits of custom orthotics.  A form was dispensed so he may call his insurance to find out  about coverage options.

## 2021-08-06 NOTE — Patient Instructions (Addendum)
Plantar Fasciitis (Heel Spur Syndrome) with Rehab  Voltaren Gel ( diclofenac gel)  is a helpful antiinflammatory gel that will help with the heel and forefoot pain.  You can find it at any drug store over the counter  The plantar fascia is a fibrous, ligament-like, soft-tissue structure that spans the bottom of the foot. Plantar fasciitis is a condition that causes pain in the foot due to inflammation of the tissue. SYMPTOMS  Pain and tenderness on the underneath side of the foot. Pain that worsens with standing or walking. CAUSES  Plantar fasciitis is caused by irritation and injury to the plantar fascia on the underneath side of the foot. Common mechanisms of injury include: Direct trauma to bottom of the foot. Damage to a small nerve that runs under the foot where the main fascia attaches to the heel bone. Stress placed on the plantar fascia due to any mild increased activity or injury RISK INCREASES WITH:  Obesity. Poor strength and flexibility. Improperly fitted shoes. Tight calf muscles. Flat feet. Failure to warm-up properly before activity.  PREVENTION Warm up and stretch properly before activity. Strength, flexibility Maintain a health body weight. Avoid stress on the plantar fascia. Wear properly fitted shoes, including arch supports for individuals who have flat feet. PROGNOSIS  If treated properly, then the symptoms of plantar fasciitis usually resolve without surgery. However, occasionally surgery is necessary. RELATED COMPLICATIONS  Recurrent symptoms that may result in a chronic condition. Problems of the lower back that are caused by compensating for the injury, such as limping. Pain or weakness of the foot during push-off following surgery. Chronic inflammation, scarring, and partial or complete fascia tear, occurring more often from repeated injections. TREATMENT  Treatment initially involves the use of ice and medication to help reduce pain and inflammation. The  use of strengthening and stretching exercises may help reduce pain with activity, especially stretches of the Achilles tendon.  Your caregiver may recommend that you use arch supports to help reduce stress on the plantar fascia. Often, corticosteroid injections are given to reduce inflammation. If symptoms persist for greater than 6 months despite non-surgical (conservative), then surgery may be recommended.  MEDICATION  If pain medication is necessary, then nonsteroidal anti-inflammatory medications, such as aspirin and ibuprofen, or other minor pain relievers, such as acetaminophen, are often recommended. Corticosteroid injections may be given by your caregiver.  HEAT AND COLD Cold treatment (icing) relieves pain and reduces inflammation. Cold treatment should be applied for 10 to 15 minutes every 2 to 3 hours for inflammation and pain and immediately after any activity that aggravates your symptoms. Use ice packs or massage the area with a piece of ice (ice massage). Heat treatment may be used prior to performing the stretching and strengthening activities prescribed by your caregiver, physical therapist, or athletic trainer. Use a heat pack or soak the injury in warm water. SEEK IMMEDIATE MEDICAL CARE IF: Treatment seems to offer no benefit, or the condition worsens. Any medications produce adverse side effects.  Perform this particular stretch daily first thing in the morning and before you go to bed. Hold for 30 seconds.    Try all the exercises and choose your favorite 3 to perform daily--  EXERCISES-- perform each exercise a total of 10-15 repetitions.  Hold for 30 seconds and perform 3 times per day   RANGE OF MOTION (ROM) AND STRETCHING EXERCISES - Plantar Fasciitis (Heel Spur Syndrome) These exercises may help you when beginning to rehabilitate your injury.   While completing  these exercises, remember:  Restoring tissue flexibility helps normal motion to return to the joints. This  allows healthier, less painful movement and activity. An effective stretch should be held for at least 30 seconds. A stretch should never be painful. You should only feel a gentle lengthening or release in the stretched tissue. RANGE OF MOTION - Toe Extension, Flexion Sit with your right / left leg crossed over your opposite knee. Grasp your toes and gently pull them back toward the top of your foot. You should feel a stretch on the bottom of your toes and/or foot. Hold this stretch for __________ seconds. Now, gently pull your toes toward the bottom of your foot. You should feel a stretch on the top of your toes and or foot. Hold this stretch for __________ seconds. Repeat __________ times. Complete this stretch __________ times per day.  RANGE OF MOTION - Ankle Dorsiflexion, Active Assisted Remove shoes and sit on a chair that is preferably not on a carpeted surface. Place right / left foot under knee. Extend your opposite leg for support. Keeping your heel down, slide your right / left foot back toward the chair until you feel a stretch at your ankle or calf. If you do not feel a stretch, slide your bottom forward to the edge of the chair, while still keeping your heel down. Hold this stretch for __________ seconds. Repeat __________ times. Complete this stretch __________ times per day.  STRETCH  Gastroc, Standing Place hands on wall. Extend right / left leg, keeping the front knee somewhat bent. Slightly point your toes inward on your back foot. Keeping your right / left heel on the floor and your knee straight, shift your weight toward the wall, not allowing your back to arch. You should feel a gentle stretch in the right / left calf. Hold this position for __________ seconds. Repeat __________ times. Complete this stretch __________ times per day. STRETCH  Soleus, Standing Place hands on wall. Extend right / left leg, keeping the other knee somewhat bent. Slightly point your toes  inward on your back foot. Keep your right / left heel on the floor, bend your back knee, and slightly shift your weight over the back leg so that you feel a gentle stretch deep in your back calf. Hold this position for __________ seconds. Repeat __________ times. Complete this stretch __________ times per day. STRETCH  Gastrocsoleus, Standing  Note: This exercise can place a lot of stress on your foot and ankle. Please complete this exercise only if specifically instructed by your caregiver.  Place the ball of your right / left foot on a step, keeping your other foot firmly on the same step. Hold on to the wall or a rail for balance. Slowly lift your other foot, allowing your body weight to press your heel down over the edge of the step. You should feel a stretch in your right / left calf. Hold this position for __________ seconds. Repeat this exercise with a slight bend in your right / left knee. Repeat __________ times. Complete this stretch __________ times per day.  STRENGTHENING EXERCISES - Plantar Fasciitis (Heel Spur Syndrome)  These exercises may help you when beginning to rehabilitate your injury. They may resolve your symptoms with or without further involvement from your physician, physical therapist or athletic trainer. While completing these exercises, remember:  Muscles can gain both the endurance and the strength needed for everyday activities through controlled exercises. Complete these exercises as instructed by your physician, physical  therapist or athletic trainer. Progress the resistance and repetitions only as guided.

## 2021-11-09 DIAGNOSIS — R079 Chest pain, unspecified: Secondary | ICD-10-CM | POA: Diagnosis not present

## 2022-01-15 DIAGNOSIS — R42 Dizziness and giddiness: Secondary | ICD-10-CM | POA: Diagnosis not present

## 2022-01-15 DIAGNOSIS — M79602 Pain in left arm: Secondary | ICD-10-CM | POA: Diagnosis not present

## 2022-02-02 DIAGNOSIS — E7800 Pure hypercholesterolemia, unspecified: Secondary | ICD-10-CM | POA: Diagnosis not present

## 2022-02-02 DIAGNOSIS — I10 Essential (primary) hypertension: Secondary | ICD-10-CM | POA: Diagnosis not present

## 2022-02-02 DIAGNOSIS — E1169 Type 2 diabetes mellitus with other specified complication: Secondary | ICD-10-CM | POA: Diagnosis not present

## 2022-02-02 DIAGNOSIS — J309 Allergic rhinitis, unspecified: Secondary | ICD-10-CM | POA: Diagnosis not present

## 2022-03-22 ENCOUNTER — Encounter (HOSPITAL_COMMUNITY): Payer: Self-pay | Admitting: Emergency Medicine

## 2022-03-22 ENCOUNTER — Ambulatory Visit (HOSPITAL_COMMUNITY)
Admission: EM | Admit: 2022-03-22 | Discharge: 2022-03-22 | Disposition: A | Discharge status: Home / Self Care | Payer: Medicare Other

## 2022-03-22 DIAGNOSIS — M542 Cervicalgia: Secondary | ICD-10-CM

## 2022-03-22 DIAGNOSIS — M62838 Other muscle spasm: Secondary | ICD-10-CM

## 2022-03-22 NOTE — ED Provider Notes (Signed)
[facility]    CSN: [id number] Arrival date & time: 03/22/22  1328      History   Chief Complaint Chief Complaint  Patient presents with   Headache   Neck Pain    HPI Caleb [name] is a 71 y.o. male.  Presents with a couple day history of intermittent neck pain. Reports left side neck will randomly be sore for a couple minutes and then go away.  Does not notice if it's worse after sleep or with movement. Usually only 2/10, highest pain was 5/10 once.  Denies any pains today and just wanted to be evaluated. Denies headache, vision changes or blurry vision, lightheadedness, back pain, weakness, numbness or tingling in the extremities.  No injury or trauma to the head or neck.  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) 08/06/2021   Type 2 diabetes mellitus with other specified complication (HCC) 08/06/2021   Vitamin D deficiency 08/06/2021   Sleep apnea 12/23/2020   S/P right rotator cuff repair 02/02/2018   Nontraumatic complete tear of right rotator cuff 11/15/2017   Chronic right shoulder pain 11/15/2017   Acute pain of right shoulder 10/18/2017   Chronic post-traumatic stress disorder 04/02/2014   Hearing loss 02/05/2014   Anxiety 11/08/2013   Ex-smoker 11/08/2013   Personal history of colonic polyps 07/13/2003    Past Surgical History:  Procedure Laterality Date   LEFT HEART  CATHETERIZATION WITH CORONARY ANGIOGRAM N/A 05/04/2013   Procedure: LEFT HEART CATHETERIZATION WITH CORONARY ANGIOGRAM;  Surgeon: Pamella Pert, MD;  Location: [facility] CATH LAB;  Service: Cardiovascular;  Laterality: N/A;   NO PAST SURGERIES     ROTATOR CUFF REPAIR Right 2020       Home Medications    Prior to Admission medications   Medication Sig Start Date End Date Taking? Authorizing Provider  calcium carbonate (OS-CAL) 1250 (500 Ca) MG chewable tablet Chew 1 tablet by mouth daily.    [provider]  Cholecalciferol 25 MCG (1000 UT) tablet Take 1 tablet by mouth daily. 05/06/21   [provider]  ferrous sulfate 324 MG TBEC Take 324 mg by mouth daily with breakfast.    [provider]  fexofenadine (ALLEGRA) 180 MG tablet Take 180 mg by mouth daily.  04/13/16   [provider]  fluticasone (FLONASE) 50 MCG/ACT nasal spray INSTILL 2 SPRAYS IN EACH NOSTRIL DAILY AS NEEDED 05/05/21   [provider]  glimepiride (AMARYL) 2 MG tablet Take 2 mg by mouth daily. 12/09/20   [provider]  hydrochlorothiazide (HYDRODIURIL) 25 MG tablet TAKE 1 TABLET BY MOUTH EVERY DAY IN THE MORNING Patient not taking: Reported on 08/06/2021 03/11/21   Tolia, Sunit, DO  losartan (COZAAR) 50 MG tablet Take 0.5 tablets by mouth daily. 05/05/21   [provider]  metFORMIN (GLUCOPHAGE) 1000 MG tablet TAKE ONE TABLET BY MOUTH TWICE A DAY FOR DIABETES (ANNUAL KIDNEY FUNCTION TESTING IS NEEDED) 05/05/21  [provider]  Multiple Vitamins-Minerals (MULTIVITAMIN WITH MINERALS) tablet Take 1 tablet by mouth daily.    [provider]  pantoprazole (PROTONIX) 40 MG tablet Take 40 mg by mouth daily as needed. 07/31/21   [provider]  pioglitazone (ACTOS) 15 MG tablet Take 15 mg by mouth daily. 08/04/21   [provider]  psyllium (KONSYL) 33 % POWD TAKE 1 TEASPOONFUL BY MOUTH DAILY AS NEEDED 05/05/21   [provider]  rosuvastatin (CRESTOR) 10 MG tablet Take 10 mg by mouth 3 (three) times a week. 09/11/20   [provider]  vitamin B-12 (CYANOCOBALAMIN) 500 MCG tablet Take 1 tablet by mouth daily. 05/06/21   [provider]    Family History Family History  Problem Relation Age of Onset   Cancer Mother    Cancer Father    Hypertension Sister    Diabetes Sister    Diabetes Sister    Hypertension Sister    Cancer Brother    Diabetes Brother    Hypertension Brother    Diabetes Brother     Social History Social History   Tobacco Use   Smoking status: Former    Packs/day: 0.12    Years: 20.00    Total pack years: 2.40    Types: Cigarettes    Quit date: 1980    Years since quitting: 43.7   Smokeless tobacco: Never   Tobacco comments:    05/04/2013 "quit smoking ~ 1991"  Substance Use Topics   Alcohol use: No   Drug use: No     Allergies   Simvastatin and Tramadol hcl   Review of Systems Review of Systems  Musculoskeletal:  Positive for neck pain.   Per HPI  Physical Exam Triage Vital Signs ED Triage Vitals  Enc Vitals Group     BP 03/22/22 1403 (!) 161/89     Pulse Rate 03/22/22 1403 76     Resp 03/22/22 1403 17     Temp 03/22/22 1403 97.9 F (36.6 C)     Temp Source 03/22/22 1403 Oral     SpO2 03/22/22 1403 96 %     Weight --      Height --      Head Circumference --      Peak Flow --      Pain Score 03/22/22 1402 5     Pain Loc --      Pain Edu? --      Excl. in GC? --    No data found.  Updated Vital Signs BP (!) 161/89 (BP Location: Right Arm)   Pulse 76   Temp 97.9 F (36.6 C) (Oral)   Resp 17   SpO2 96%      Physical Exam Vitals and nursing note reviewed.  Constitutional:      General: He is not in acute distress. HENT:     Head: Normocephalic and atraumatic.     Right Ear: Tympanic membrane and ear canal normal.     Left Ear: Tympanic membrane and ear canal normal.     Nose: Nose normal.     Mouth/Throat:     Mouth:  Mucous membranes are moist.     Pharynx: Oropharynx is clear.  Eyes:     Extraocular Movements: Extraocular movements intact.     Conjunctiva/sclera: Conjunctivae normal.     Pupils: Pupils are equal, round, and reactive to light.  Neck:   Cardiovascular:     Rate and Rhythm: Normal rate and  regular rhythm.     Heart sounds: Normal heart sounds.  Pulmonary:     Effort: Pulmonary effort is normal. No respiratory distress.     Breath sounds: Normal breath sounds.  Abdominal:     Palpations: Abdomen is soft.     Tenderness: There is no abdominal tenderness.  Musculoskeletal:        General: Normal range of motion.     Cervical back: Full passive range of motion without pain and normal range of motion. No rigidity, torticollis or crepitus. Muscular tenderness present. No pain with movement or spinous process tenderness. Normal range of motion.  Lymphadenopathy:     Cervical: No cervical adenopathy.  Neurological:     General: No focal deficit present.     Mental Status: He is alert and oriented to person, place, and time.     Cranial Nerves: No facial asymmetry.     Sensory: Sensation is intact. No sensory deficit.     Motor: Motor function is intact. No weakness.     Coordination: Coordination is intact. Coordination normal.     Gait: Gait is intact. Gait normal.     Comments: Strength 5/5 upper extrem bilat      UC Treatments / Results  Labs (all labs ordered are listed, but only abnormal results are displayed) Labs Reviewed - No data to display  EKG  Radiology No results found.  Procedures Procedures (including critical care time)  Medications Ordered in UC Medications - No data to display  Initial Impression / Assessment and Plan / UC Course  I have reviewed the triage vital signs and the nursing notes.  Pertinent labs & imaging results that were available during my care of the patient were reviewed by me and considered in my medical decision making (see chart  for details).  Unknown etiology of symptoms.  Discussed with patient could be muscular, trapezius muscle.  It is intermittent and only occurred a couple times for a few minutes so difficult to diagnose. Neurological exam unremarkable. No bony tenderness, full ROM, no pain with neck motion, xray not warranted. He is a little tender over the area so could be muscular.  Discussed using Tylenol, topical treatments such as lidocaine patch. He will follow up with his primary care provider if symptoms persist. Discussed strict ED precautions for worsening symptoms. Patient agrees to plan.  Final Clinical Impressions(s) / UC Diagnoses   Final diagnoses:  Trapezius muscle spasm  Neck pain on left side     Discharge Instructions      I recommend trying topical therapy such as lidocaine, IcyHot, SalonPas etc.  You can also try tylenol.  Please follow up with your primary care provider if symptoms persist.  Please go to the emergency department if symptoms worsen.    ED Prescriptions   None    PDMP not reviewed this encounter.   Maricela Schreur, Lurena Joiner, [place] 03/22/22 1521

## 2022-03-22 NOTE — Discharge Instructions (Addendum)
I recommend trying topical therapy such as lidocaine, IcyHot, SalonPas etc.  You can also try tylenol.  Please follow up with your primary care provider if symptoms persist.  Please go to the emergency department if symptoms worsen.

## 2022-03-22 NOTE — ED Triage Notes (Signed)
Pt c/o intermittent posterior head pains and neck pains on left side that will last a couple minutes and go away. Today pains have been more frequent.

## 2022-03-23 ENCOUNTER — Ambulatory Visit (HOSPITAL_COMMUNITY): Payer: Medicare Other

## 2022-04-14 DIAGNOSIS — Z23 Encounter for immunization: Secondary | ICD-10-CM | POA: Diagnosis not present

## 2022-07-28 ENCOUNTER — Emergency Department (HOSPITAL_COMMUNITY): Payer: Medicare Other

## 2022-07-28 ENCOUNTER — Other Ambulatory Visit: Payer: Self-pay

## 2022-07-28 ENCOUNTER — Emergency Department (HOSPITAL_COMMUNITY)
Admission: EM | Admit: 2022-07-28 | Discharge: 2022-07-29 | Disposition: A | Discharge status: Home / Self Care | Payer: Medicare Other | Attending: Emergency Medicine | Admitting: Emergency Medicine

## 2022-07-28 ENCOUNTER — Encounter (HOSPITAL_COMMUNITY): Payer: Self-pay | Admitting: Oncology

## 2022-07-28 DIAGNOSIS — R0789 Other chest pain: Secondary | ICD-10-CM | POA: Diagnosis not present

## 2022-07-28 DIAGNOSIS — I10 Essential (primary) hypertension: Secondary | ICD-10-CM | POA: Diagnosis not present

## 2022-07-28 DIAGNOSIS — R079 Chest pain, unspecified: Secondary | ICD-10-CM | POA: Diagnosis not present

## 2022-07-28 DIAGNOSIS — Z7984 Long term (current) use of oral hypoglycemic drugs: Secondary | ICD-10-CM | POA: Insufficient documentation

## 2022-07-28 DIAGNOSIS — Z79899 Other long term (current) drug therapy: Secondary | ICD-10-CM | POA: Insufficient documentation

## 2022-07-28 DIAGNOSIS — K219 Gastro-esophageal reflux disease without esophagitis: Secondary | ICD-10-CM | POA: Diagnosis not present

## 2022-07-28 DIAGNOSIS — E119 Type 2 diabetes mellitus without complications: Secondary | ICD-10-CM | POA: Insufficient documentation

## 2022-07-28 LAB — COMPREHENSIVE METABOLIC PANEL
ALT: 18 U/L (ref 0–44)
AST: 24 U/L (ref 15–41)
Albumin: 4 g/dL (ref 3.5–5.0)
Alkaline Phosphatase: 42 U/L (ref 38–126)
Anion gap: 8 (ref 5–15)
BUN: 11 mg/dL (ref 8–23)
CO2: 24 mmol/L (ref 22–32)
Calcium: 9.3 mg/dL (ref 8.9–10.3)
Chloride: 105 mmol/L (ref 98–111)
Creatinine, Ser: 0.81 mg/dL (ref 0.61–1.24)
GFR, Estimated: >60 mL/min (ref >60)
Glucose, Bld: 151 mg/dL — ABNORMAL HIGH (ref 70–99)
Potassium: 4.3 mmol/L (ref 3.5–5.1)
Sodium: 137 mmol/L (ref 135–145)
Total Bilirubin: 0.7 mg/dL (ref 0.3–1.2)
Total Protein: 7.3 g/dL (ref 6.5–8.1)

## 2022-07-28 LAB — CBC WITH DIFFERENTIAL/PLATELET
Abs Immature Granulocytes: 0.01 10*3/uL (ref 0.00–0.07)
Basophils Absolute: 0 10*3/uL (ref 0.0–0.1)
Basophils Relative: 1 %
Eosinophils Absolute: 0.1 10*3/uL (ref 0.0–0.5)
Eosinophils Relative: 1 %
HCT: 42.3 % (ref 39.0–52.0)
Hemoglobin: 13.6 g/dL (ref 13.0–17.0)
Immature Granulocytes: 0 %
Lymphocytes Relative: 34 %
Lymphs Abs: 2.1 10*3/uL (ref 0.7–4.0)
MCH: 24.9 pg — ABNORMAL LOW (ref 26.0–34.0)
MCHC: 32.2 g/dL (ref 30.0–36.0)
MCV: 77.3 fL — ABNORMAL LOW (ref 80.0–100.0)
Monocytes Absolute: 0.4 10*3/uL (ref 0.1–1.0)
Monocytes Relative: 7 %
Neutro Abs: 3.5 10*3/uL (ref 1.7–7.7)
Neutrophils Relative %: 57 %
Platelets: 311 10*3/uL (ref 150–400)
RBC: 5.47 MIL/uL (ref 4.22–5.81)
RDW: 18.6 % — ABNORMAL HIGH (ref 11.5–15.5)
WBC: 6.1 10*3/uL (ref 4.0–10.5)
nRBC: 0 % (ref 0.0–0.2)

## 2022-07-28 LAB — TROPONIN I (HIGH SENSITIVITY)
Troponin I (High Sensitivity): 5 ng/L (ref <18)
Troponin I (High Sensitivity): 6 ng/L (ref <18)

## 2022-07-28 LAB — LIPASE, BLOOD: Lipase: 42 U/L (ref 11–51)

## 2022-07-28 NOTE — ED Triage Notes (Signed)
Pt presents d/t c/o generalized CP that began yesterday. Pt endorses feeling faint as well as abdominal pain.

## 2022-07-28 NOTE — ED Provider Triage Note (Signed)
Emergency Medicine Provider Triage Evaluation Note  Caleb [name] , a 72 y.o. male  was evaluated in triage.  Pt complains of substernal chest pain started today.  It comes and goes, denies any nausea or vomiting.  No previous heart attacks..  Review of Systems   PER HPI   Physical Exam  BP (!) 173/91 (BP Location: Right Arm)   Pulse 85   Temp 98.3 F (36.8 C) (Oral)   Resp 16   Ht 5\' 9"  (1.753 m)   Wt 86.2 kg   SpO2 96%   BMI 28.06 kg/m  Gen:   Awake, no distress   Resp:  Normal effort  MSK:   Moves extremities without difficulty  Other:    Medical Decision Making  Medically screening exam initiated at 2:30 PM.  Appropriate orders placed.  Caleb [name] was informed that the remainder of the evaluation will be completed by another provider, this initial triage assessment does not replace that evaluation, and the importance of remaining in the ED until their evaluation is complete.     Sherrill Raring, PA-C 07/28/22 1430

## 2022-07-29 DIAGNOSIS — R0789 Other chest pain: Secondary | ICD-10-CM | POA: Diagnosis not present

## 2022-07-29 MED ORDER — LIDOCAINE VISCOUS HCL 2 % MT SOLN
15.0000 mL | Freq: Once | OROMUCOSAL | Status: AC
  Administered 2022-07-29: 15 mL via ORAL
  Filled 2022-07-29: qty 15

## 2022-07-29 MED ORDER — PANTOPRAZOLE SODIUM 20 MG PO TBEC: 20.0000 mg | DELAYED_RELEASE_TABLET | Freq: Every day | ORAL | 0 refills | Status: DC

## 2022-07-29 MED ORDER — ALUM & MAG HYDROXIDE-SIMETH 200-200-20 MG/5ML PO SUSP
30.0000 mL | Freq: Once | ORAL | Status: AC
  Administered 2022-07-29: 30 mL via ORAL
  Filled 2022-07-29: qty 30

## 2022-07-29 NOTE — Discharge Instructions (Signed)

## 2022-07-29 NOTE — ED Provider Notes (Signed)
[facility] DEPT Provider Note  CSN: [id number] Arrival date & time: 07/28/22 1316  Chief Complaint(s) Chest Pain  HPI Caleb [name] is a 72 y.o. male with a past medical history listed below including hypertension, hyperlipidemia and diabetes who presents to the emergency department with 2 days of constant bilateral and central chest pain described as burning and aching.  No alleviating or aggravating factors.  Pain is not radiating and nonexertional.  Patient does also endorse upper abdominal discomfort.  No nausea or vomiting.  No diarrhea.  No fevers or chills.  No cough or congestion.  No shortness of breath.  Patient reports that he has had similar episodes in the past.  He has been evaluated by cardiology who felt his chest pain at that time was likely MSK.  Patient's heart cath in 2014 showed no CAD.  Stress test in 2022 was negative.  The history is provided by the patient.    Past Medical History Past Medical History:  Diagnosis Date   High cholesterol    Sleep apnea    Type II diabetes mellitus [facility])    Patient Active Problem List   Diagnosis Date Noted   Allergic rhinitis 08/06/2021   Benign essential hypertension 08/06/2021   Bowel incontinence 08/06/2021   Eczema 08/06/2021   Ganglion cyst 08/06/2021   Gastroesophageal reflux disease 08/06/2021   Hypercholesterolemia 08/06/2021   Iron deficiency anemia 08/06/2021   Pelvic and perineal pain 08/06/2021   Primary generalized (osteo)arthritis 08/06/2021   Sickle cell trait with coexistent alpha-thalassemia ([place]) 08/06/2021   Type 2 diabetes mellitus with other specified complication ([place]) 72/53/6644   Vitamin D deficiency 08/06/2021   Sleep apnea 12/23/2020   S/P right rotator cuff repair 02/02/2018   Nontraumatic complete tear of right rotator cuff 11/15/2017   Chronic right shoulder pain 11/15/2017   Acute pain of right shoulder 10/18/2017   Chronic post-traumatic stress  disorder 04/02/2014   Hearing loss 02/05/2014   Anxiety 11/08/2013   Ex-smoker 11/08/2013   Personal history of colonic polyps 07/13/2003   Home Medication(s) Prior to Admission medications   Medication Sig Start Date End Date Taking? Authorizing Provider  Cholecalciferol 25 MCG (1000 UT) tablet Take 1 tablet by mouth daily. 05/06/21  Yes [provider]  ferrous sulfate 324 MG TBEC Take 324 mg by mouth daily with breakfast. Take on Tues, Thurs, Sat   Yes [provider]  fexofenadine (ALLEGRA) 180 MG tablet Take 180 mg by mouth daily as needed for allergies. 04/13/16  Yes [provider]  fluticasone (FLONASE) 50 MCG/ACT nasal spray Place 1 spray into both nostrils daily as needed for allergies. 05/05/21  Yes [provider]  glimepiride (AMARYL) 4 MG tablet Take 4 mg by mouth every morning. 05/11/22  Yes [provider]  losartan (COZAAR) 25 MG tablet Take 25 mg by mouth daily. 05/29/22  Yes [provider]  metFORMIN (GLUCOPHAGE) 1000 MG tablet TAKE ONE TABLET BY MOUTH TWICE A DAY FOR DIABETES (ANNUAL KIDNEY FUNCTION TESTING IS NEEDED) 05/05/21  Yes [provider]  Multiple Vitamins-Minerals (MULTIVITAMIN WITH MINERALS) tablet Take 1 tablet by mouth daily.   Yes [provider]  pantoprazole (PROTONIX) 20 MG tablet Take 1 tablet (20 mg total) by mouth daily. 07/29/22 08/28/22 Yes Tamra Koos, Grayce Sessions, MD  rosuvastatin (CRESTOR) 10 MG tablet Take 10 mg by mouth 3 (three) times a week. MWF 09/11/20  Yes [provider]  vitamin B-12 (CYANOCOBALAMIN) 500 MCG tablet Take 1 tablet by  mouth daily. 05/06/21  Yes [provider]  hydrochlorothiazide (HYDRODIURIL) 25 MG tablet TAKE 1 TABLET BY MOUTH EVERY DAY IN THE MORNING Patient not taking: Reported on 08/06/2021 03/11/21   Rex Kras, DO                                                                                                                                     Allergies Simvastatin and Tramadol hcl  Review of Systems Review of Systems As noted in HPI  Physical Exam Vital Signs  I have reviewed the triage vital signs BP (!) 142/80   Pulse 70   Temp (!) 97.5 F (36.4 C) (Oral)   Resp 16   Ht 5\' 9"  (1.753 m)   Wt 86.2 kg   SpO2 97%   BMI 28.06 kg/m   Physical Exam Vitals reviewed.  Constitutional:      General: He is not in acute distress.    Appearance: He is well-developed. He is not diaphoretic.  HENT:     Head: Normocephalic and atraumatic.     Nose: Nose normal.  Eyes:     General: No scleral icterus.       Right eye: No discharge.        Left eye: No discharge.     Conjunctiva/sclera: Conjunctivae normal.     Pupils: Pupils are equal, round, and reactive to light.  Cardiovascular:     Rate and Rhythm: Normal rate and regular rhythm.     Heart sounds: No murmur heard.    No friction rub. No gallop.  Pulmonary:     Effort: Pulmonary effort is normal. No respiratory distress.     Breath sounds: Normal breath sounds. No stridor. No rales.  Chest:     Chest wall: Tenderness present.    Abdominal:     General: There is no distension.     Palpations: Abdomen is soft.     Tenderness: There is no abdominal tenderness.  Musculoskeletal:        General: No tenderness.     Cervical back: Normal range of motion and neck supple.  Skin:    General: Skin is warm and dry.     Findings: No erythema or rash.  Neurological:     Mental Status: He is alert and oriented to person, place, and time.     ED Results and Treatments Labs (all labs ordered are listed, but only abnormal results are displayed) Labs Reviewed  COMPREHENSIVE METABOLIC PANEL - Abnormal; Notable for the following components:      Result Value   Glucose, Bld 151 (*)    All other components within normal limits  CBC WITH DIFFERENTIAL/PLATELET - Abnormal; Notable for the following components:   MCV 77.3 (*)    MCH 24.9 (*)    RDW 18.6 (*)    All  other components within normal limits  LIPASE, BLOOD  TROPONIN I (HIGH SENSITIVITY)  TROPONIN I (HIGH SENSITIVITY)                                                                                                                         EKG  EKG Interpretation  Date/Time:  Wednesday July 28 2022 13:22:10 EST Ventricular Rate:  88 PR Interval:  176 QRS Duration: 76 QT Interval:  322 QTC Calculation: 389 R Axis:   62 Text Interpretation: Normal sinus rhythm Normal ECG When compared with ECG of 11-Dec-2020 10:42, PREVIOUS ECG IS PRESENT No significant change was found Confirmed by Drema Pry [phone]) on 07/29/2022 12:22:20 AM       Radiology DG Chest 2 View  Result Date: 07/28/2022 CLINICAL DATA:  Chest pain that began yesterday EXAM: CHEST - 2 VIEW COMPARISON:  12/11/2020 FINDINGS: The heart size and mediastinal contours are within normal limits. Both lungs are clear. The visualized skeletal structures are unremarkable. Air-fluid level seen in the stomach beneath the left hemidiaphragm. Please correlate with any symptoms. IMPRESSION: No acute cardiopulmonary disease. Air-fluid level within a potentially distended stomach beneath the left hemidiaphragm. Please correlate with any symptoms Electronically Signed   By: Karen Kays M.D.   On: 07/28/2022 14:50    Medications Ordered in ED Medications  alum & mag hydroxide-simeth (MAALOX/MYLANTA) 200-200-20 MG/5ML suspension 30 mL (30 mLs Oral Given 07/29/22 0048)    And  lidocaine (XYLOCAINE) 2 % viscous mouth solution 15 mL (15 mLs Oral Given 07/29/22 0048)                                                                                                                                     Procedures Procedures  (including critical care time)  Medical Decision Making / ED Course   Medical Decision Making Risk OTC drugs. Prescription drug management.    Chest pain with upper abdominal discomfort differential includes but not  limited to ACS, MSK, GI related, PE, aortic dissection, pneumonia,, pneumothorax,, pancreatitis or biliary disease.  EKG without acute ischemic changes or evidence of pericarditis.  Serial troponins negative x 2.  Heart score 4 but given the patient's duration of constant pain for more than 24 hours, I feel that ACS is unlikely given his negative workup at this time.  Chest x-ray without evidence of pneumonia or pneumothorax, pulmonary edema, pneumomediastinum, widened mediastinum or cardiomegaly.  Presentation is not classic for aortic dissection or esophageal perforation.  CBC  without leukocytosis or anemia CMP without significant electrolyte derangements.  Mild hyperglycemia without evidence of DKA.  No renal sufficiency.  No evidence of bili obstruction or pancreatitis.  Patient provided with GI cocktail which significantly improved his chest pain and upper abdominal pain.  Given this, gastritis/esophagitis is likely contributing to his discomfort today.  Given his tenderness to palpation, MSK is also likely.  Given the reassuring workup at this time, feel patient has been ruled out for the other etiologies above.        Final Clinical Impression(s) / ED Diagnoses Final diagnoses:  Chest wall pain  Gastroesophageal reflux disease, unspecified whether esophagitis present   The patient appears reasonably screened and/or stabilized for discharge and I doubt any other medical condition or other [facility] requiring further screening, evaluation, or treatment in the ED at this time. I have discussed the findings, Dx and Tx plan with the patient/family who expressed understanding and agree(s) with the plan. Discharge instructions discussed at length. The patient/family was given strict return precautions who verbalized understanding of the instructions. No further questions at time of discharge.  Disposition: Discharge  Condition: Good  ED Discharge Orders          Ordered    pantoprazole  (PROTONIX) 20 MG tablet  Daily        07/29/22 1031             Follow Up: Clovis Riley, L.August Saucer, MD [address] [place] [address] [phone]  Call  to schedule an appointment for close follow up  Tessa Lerner, DO [address] [place] [address] [phone]  Call  to schedule an appointment for close follow up           This chart was dictated using voice recognition software.  Despite best efforts to proofread,  errors can occur which can change the documentation meaning.    Nira Conn, MD 07/29/22 [phone]

## 2022-08-19 DIAGNOSIS — J309 Allergic rhinitis, unspecified: Secondary | ICD-10-CM | POA: Diagnosis not present

## 2022-08-19 DIAGNOSIS — E559 Vitamin D deficiency, unspecified: Secondary | ICD-10-CM | POA: Diagnosis not present

## 2022-08-19 DIAGNOSIS — I10 Essential (primary) hypertension: Secondary | ICD-10-CM | POA: Diagnosis not present

## 2022-08-19 DIAGNOSIS — E11620 Type 2 diabetes mellitus with diabetic dermatitis: Secondary | ICD-10-CM | POA: Diagnosis not present

## 2022-08-19 DIAGNOSIS — E7800 Pure hypercholesterolemia, unspecified: Secondary | ICD-10-CM | POA: Diagnosis not present

## 2022-08-19 DIAGNOSIS — K219 Gastro-esophageal reflux disease without esophagitis: Secondary | ICD-10-CM | POA: Diagnosis not present

## 2022-08-19 DIAGNOSIS — E1165 Type 2 diabetes mellitus with hyperglycemia: Secondary | ICD-10-CM | POA: Diagnosis not present

## 2022-08-19 DIAGNOSIS — D508 Other iron deficiency anemias: Secondary | ICD-10-CM | POA: Diagnosis not present

## 2022-08-19 DIAGNOSIS — Z0000 Encounter for general adult medical examination without abnormal findings: Secondary | ICD-10-CM | POA: Diagnosis not present

## 2022-09-03 DIAGNOSIS — Z87891 Personal history of nicotine dependence: Secondary | ICD-10-CM | POA: Diagnosis not present

## 2022-09-03 DIAGNOSIS — Z136 Encounter for screening for cardiovascular disorders: Secondary | ICD-10-CM | POA: Diagnosis not present

## 2022-09-08 DIAGNOSIS — Z125 Encounter for screening for malignant neoplasm of prostate: Secondary | ICD-10-CM | POA: Diagnosis not present

## 2022-10-13 DIAGNOSIS — H539 Unspecified visual disturbance: Secondary | ICD-10-CM | POA: Diagnosis not present

## 2022-10-13 DIAGNOSIS — D509 Iron deficiency anemia, unspecified: Secondary | ICD-10-CM | POA: Diagnosis not present

## 2022-10-13 DIAGNOSIS — K219 Gastro-esophageal reflux disease without esophagitis: Secondary | ICD-10-CM | POA: Diagnosis not present

## 2022-11-01 DIAGNOSIS — M545 Low back pain, unspecified: Secondary | ICD-10-CM | POA: Diagnosis not present

## 2022-11-16 ENCOUNTER — Other Ambulatory Visit: Payer: Self-pay

## 2022-11-16 ENCOUNTER — Emergency Department (HOSPITAL_COMMUNITY)
Admission: EM | Admit: 2022-11-16 | Discharge: 2022-11-16 | Disposition: A | Discharge status: Home / Self Care | Payer: Medicare Other | Attending: Student in an Organized Health Care Education/Training Program | Admitting: Student in an Organized Health Care Education/Training Program

## 2022-11-16 ENCOUNTER — Encounter (HOSPITAL_COMMUNITY): Payer: Self-pay | Admitting: Emergency Medicine

## 2022-11-16 ENCOUNTER — Emergency Department (HOSPITAL_COMMUNITY): Payer: Medicare Other

## 2022-11-16 DIAGNOSIS — Z7984 Long term (current) use of oral hypoglycemic drugs: Secondary | ICD-10-CM | POA: Insufficient documentation

## 2022-11-16 DIAGNOSIS — M25511 Pain in right shoulder: Secondary | ICD-10-CM | POA: Diagnosis not present

## 2022-11-16 DIAGNOSIS — I700 Atherosclerosis of aorta: Secondary | ICD-10-CM | POA: Diagnosis not present

## 2022-11-16 DIAGNOSIS — K7689 Other specified diseases of liver: Secondary | ICD-10-CM | POA: Diagnosis not present

## 2022-11-16 DIAGNOSIS — E119 Type 2 diabetes mellitus without complications: Secondary | ICD-10-CM | POA: Insufficient documentation

## 2022-11-16 DIAGNOSIS — R109 Unspecified abdominal pain: Secondary | ICD-10-CM | POA: Diagnosis not present

## 2022-11-16 DIAGNOSIS — R1011 Right upper quadrant pain: Secondary | ICD-10-CM | POA: Diagnosis not present

## 2022-11-16 DIAGNOSIS — R0789 Other chest pain: Secondary | ICD-10-CM | POA: Diagnosis not present

## 2022-11-16 LAB — COMPREHENSIVE METABOLIC PANEL
ALT: 25 U/L (ref 0–44)
AST: 20 U/L (ref 15–41)
Albumin: 3.6 g/dL (ref 3.5–5.0)
Alkaline Phosphatase: 41 U/L (ref 38–126)
Anion gap: 8 (ref 5–15)
BUN: 10 mg/dL (ref 8–23)
CO2: 26 mmol/L (ref 22–32)
Calcium: 9.3 mg/dL (ref 8.9–10.3)
Chloride: 98 mmol/L (ref 98–111)
Creatinine, Ser: 0.97 mg/dL (ref 0.61–1.24)
GFR, Estimated: >60 mL/min (ref >60)
Glucose, Bld: 290 mg/dL — ABNORMAL HIGH (ref 70–99)
Potassium: 4.7 mmol/L (ref 3.5–5.1)
Sodium: 132 mmol/L — ABNORMAL LOW (ref 135–145)
Total Bilirubin: 0.6 mg/dL (ref 0.3–1.2)
Total Protein: 6.8 g/dL (ref 6.5–8.1)

## 2022-11-16 LAB — CBC WITH DIFFERENTIAL/PLATELET
Abs Immature Granulocytes: 0.01 10*3/uL (ref 0.00–0.07)
Basophils Absolute: 0 10*3/uL (ref 0.0–0.1)
Basophils Relative: 1 %
Eosinophils Absolute: 0.1 10*3/uL (ref 0.0–0.5)
Eosinophils Relative: 2 %
HCT: 40.6 % (ref 39.0–52.0)
Hemoglobin: 13.4 g/dL (ref 13.0–17.0)
Immature Granulocytes: 0 %
Lymphocytes Relative: 29 %
Lymphs Abs: 1.3 10*3/uL (ref 0.7–4.0)
MCH: 25.7 pg — ABNORMAL LOW (ref 26.0–34.0)
MCHC: 33 g/dL (ref 30.0–36.0)
MCV: 77.8 fL — ABNORMAL LOW (ref 80.0–100.0)
Monocytes Absolute: 0.4 10*3/uL (ref 0.1–1.0)
Monocytes Relative: 8 %
Neutro Abs: 2.8 10*3/uL (ref 1.7–7.7)
Neutrophils Relative %: 60 %
Platelets: 282 10*3/uL (ref 150–400)
RBC: 5.22 MIL/uL (ref 4.22–5.81)
RDW: 17.1 % — ABNORMAL HIGH (ref 11.5–15.5)
WBC: 4.7 10*3/uL (ref 4.0–10.5)
nRBC: 0 % (ref 0.0–0.2)

## 2022-11-16 LAB — LIPASE, BLOOD: Lipase: 43 U/L (ref 11–51)

## 2022-11-16 LAB — TROPONIN I (HIGH SENSITIVITY)
Troponin I (High Sensitivity): 5 ng/L (ref <18)
Troponin I (High Sensitivity): 4 ng/L (ref <18)

## 2022-11-16 MED ORDER — IOHEXOL 350 MG/ML SOLN
100.0000 mL | Freq: Once | INTRAVENOUS | Status: AC | PRN
  Administered 2022-11-16: 100 mL via INTRAVENOUS

## 2022-11-16 MED ORDER — MORPHINE SULFATE (PF) 4 MG/ML IV SOLN
4.0000 mg | Freq: Once | INTRAVENOUS | Status: AC
  Administered 2022-11-16: 4 mg via INTRAVENOUS
  Filled 2022-11-16: qty 1

## 2022-11-16 MED ORDER — SODIUM CHLORIDE 0.9 % IV BOLUS
1000.0000 mL | Freq: Once | INTRAVENOUS | Status: AC
  Administered 2022-11-16: 1000 mL via INTRAVENOUS

## 2022-11-16 NOTE — Discharge Instructions (Signed)
You were evaluated in the emergency department today and your workup did not show any acute abnormalities.  However, if you have any worsening symptoms or new symptoms please return for reevaluation.  We also recommend you were reevaluated by your primary care physician.  Please return if you have any further questions or concerns.

## 2022-11-16 NOTE — ED Triage Notes (Signed)
Pt reports right sided scapula/shoulder pain that started yesterday. PT reports taking diclofenac with no relief. Denies injury to the area.

## 2022-11-16 NOTE — ED Notes (Signed)
Patient Alert and oriented to baseline. Stable and ambulatory to baseline. Patient verbalized understanding of the discharge instructions.  Patient belongings were taken by the patient.   

## 2022-11-16 NOTE — ED Notes (Signed)
Did EKG shown to er provider patient  is resting with call bell in reach

## 2022-11-16 NOTE — ED Provider Notes (Signed)
[place] EMERGENCY DEPARTMENT AT [facility] Provider Note   CSN: [id number] Arrival date & time: 11/16/22  0747     History  Chief Complaint  Patient presents with   Shoulder Pain    Caleb [name] is a 72 y.o. male.  72 year old male presenting to the emergency department complaining of right shoulder/right sided chest discomfort since yesterday.  He notes a past medical history of diabetes and iron deficiency anemia.  He denies any cardiac history.  He reports that the pain initially was located under his right side scapula and has remained there since yesterday.  He does report that the pain will come and go in waves.  He denies any abdominal discomfort, vomiting, fevers, diarrhea, heart palpitations, or dizziness.  He reports this pain did happen 1 time previously and he saw his primary care physician at that time.   Shoulder Pain      Home Medications Prior to Admission medications   Medication Sig Start Date End Date Taking? Authorizing Provider  Cholecalciferol 25 MCG (1000 UT) tablet Take 1 tablet by mouth daily. 05/06/21   [provider]  ferrous sulfate 324 MG TBEC Take 324 mg by mouth daily with breakfast. Take on Tues, Thurs, Sat    [provider]  fexofenadine (ALLEGRA) 180 MG tablet Take 180 mg by mouth daily as needed for allergies. 04/13/16   [provider]  fluticasone (FLONASE) 50 MCG/ACT nasal spray Place 1 spray into both nostrils daily as needed for allergies. 05/05/21   [provider]  glimepiride (AMARYL) 4 MG tablet Take 4 mg by mouth every morning. 05/11/22   [provider]  hydrochlorothiazide (HYDRODIURIL) 25 MG tablet TAKE 1 TABLET BY MOUTH EVERY DAY IN THE MORNING Patient not taking: Reported on 08/06/2021 03/11/21   Tolia, Sunit, DO  losartan (COZAAR) 25 MG tablet Take 25 mg by mouth daily. 05/29/22   [provider]  metFORMIN (GLUCOPHAGE) 1000 MG tablet TAKE ONE TABLET BY MOUTH  TWICE A DAY FOR DIABETES (ANNUAL KIDNEY FUNCTION TESTING IS NEEDED) 05/05/21   [provider]  Multiple Vitamins-Minerals (MULTIVITAMIN WITH MINERALS) tablet Take 1 tablet by mouth daily.    [provider]  pantoprazole (PROTONIX) 20 MG tablet Take 1 tablet (20 mg total) by mouth daily. 07/29/22 08/28/22  Nira Conn, MD  rosuvastatin (CRESTOR) 10 MG tablet Take 10 mg by mouth 3 (three) times a week. MWF 09/11/20   [provider]  vitamin B-12 (CYANOCOBALAMIN) 500 MCG tablet Take 1 tablet by mouth daily. 05/06/21   [provider]      Allergies    Simvastatin and Tramadol hcl    Review of Systems   Review of Systems  Cardiovascular:  Positive for chest pain.  All other systems reviewed and are negative.   Physical Exam Updated Vital Signs BP (!) 160/77   Pulse 64   Temp 97.8 F (36.6 C) (Oral)   Resp 16   SpO2 100%  Physical Exam Vitals reviewed.  Constitutional:      General: He is in acute distress.     Appearance: He is not toxic-appearing.  HENT:     Head: Normocephalic and atraumatic.     Nose: Nose normal.     Mouth/Throat:     Mouth: Mucous membranes are moist.  Eyes:     Conjunctiva/sclera: Conjunctivae normal.     Pupils: Pupils are equal, round, and reactive to light.  Cardiovascular:     Rate and Rhythm:  Normal rate and regular rhythm.     Pulses: Normal pulses.     Heart sounds:     No friction rub.  Pulmonary:     Effort: Pulmonary effort is normal.     Breath sounds: Normal breath sounds.  Abdominal:     General: Abdomen is flat.     Palpations: Abdomen is soft.  Musculoskeletal:        General: Normal range of motion.     Cervical back: Neck supple.  Skin:    General: Skin is warm.     Capillary Refill: Capillary refill takes less than 2 seconds.  Neurological:     General: No focal deficit present.     Mental Status: He is alert.     ED Results / Procedures / Treatments   Labs (all labs  ordered are listed, but only abnormal results are displayed) Labs Reviewed  COMPREHENSIVE METABOLIC PANEL - Abnormal; Notable for the following components:      Result Value   Sodium 132 (*)    Glucose, Bld 290 (*)    All other components within normal limits  CBC WITH DIFFERENTIAL/PLATELET - Abnormal; Notable for the following components:   MCV 77.8 (*)    MCH 25.7 (*)    RDW 17.1 (*)    All other components within normal limits  LIPASE, BLOOD  TROPONIN I (HIGH SENSITIVITY)  TROPONIN I (HIGH SENSITIVITY)  TROPONIN I (HIGH SENSITIVITY)    EKG None  Radiology CT Chest W Contrast  Result Date: 11/16/2022 CLINICAL DATA:  Right shoulder pain. Right upper quadrant abdominal pain. EXAM: CT CHEST AND ABDOMEN WITH CONTRAST TECHNIQUE: Multidetector CT imaging of the chest and abdomen was performed following the standard protocol during bolus administration of intravenous contrast. RADIATION DOSE REDUCTION: This exam was performed according to the departmental dose-optimization program which includes automated exposure control, adjustment of the mA and/or kV according to patient size and/or use of iterative reconstruction technique. CONTRAST:  OMNIPAQUE IOHEXOL 350 MG/ML SOLN COMPARISON:  Chest CTA 05/08/2016 FINDINGS: CT CHEST FINDINGS Cardiovascular: The heart size is normal. No substantial pericardial effusion. Mild atherosclerotic calcification is noted in the wall of the thoracic aorta. Mediastinum/Nodes: No mediastinal lymphadenopathy. There is no hilar lymphadenopathy. The esophagus has normal imaging features. There is no axillary lymphadenopathy. Lungs/Pleura: No suspicious pulmonary nodule or mass. No focal airspace consolidation. No pleural effusion. Musculoskeletal: No acute bony abnormality. No evidence for rib fracture CT ABDOMEN PELVIS FINDINGS Hepatobiliary: 3 mm hypervascular focus in the dome of the liver is much too small to characterize but statistically is most likely benign  etiology such as flash filling hemangioma or mass scatter malformation. There is no evidence for gallstones, gallbladder wall thickening, or pericholecystic fluid. No intrahepatic or extrahepatic biliary dilation. Pancreas: No focal mass lesion. No dilatation of the main duct. No intraparenchymal cyst. No peripancreatic edema. Spleen: No splenomegaly. No focal mass lesion. Adrenals/Urinary Tract: 17 mm left adrenal nodule shows relative washout of 43%, suggesting benign adrenal adenoma. No followup imaging is recommended. Right adrenal gland unremarkable. Kidneys unremarkable. Stomach/Bowel: Stomach is unremarkable. No gastric wall thickening. No evidence of outlet obstruction. Duodenum is normally positioned as is the ligament of Treitz. No small bowel or colonic dilatation within the visualized abdomen. Vascular/Lymphatic: There is mild atherosclerotic calcification of the abdominal aorta without aneurysm. There is no gastrohepatic or hepatoduodenal ligament lymphadenopathy. No retroperitoneal or mesenteric lymphadenopathy. Other: No intraperitoneal free fluid. Musculoskeletal: No worrisome lytic or sclerotic osseous abnormality. IMPRESSION: 1. No  acute findings in the chest or abdomen. Specifically, no findings to explain the patient's history of right upper quadrant pain. 2. 3 mm hypervascular focus in the dome of the liver is much too small to characterize but statistically is most likely benign etiology such as flash filling hemangioma or mass scatter malformation. No followup imaging is recommended. 3.  Aortic Atherosclerosis (ICD10-I70.0). Electronically Signed   By: Kennith Center M.D.   On: 11/16/2022 12:55   CT ABDOMEN W CONTRAST  Result Date: 11/16/2022 CLINICAL DATA:  Right shoulder pain. Right upper quadrant abdominal pain. EXAM: CT CHEST AND ABDOMEN WITH CONTRAST TECHNIQUE: Multidetector CT imaging of the chest and abdomen was performed following the standard protocol during bolus administration of  intravenous contrast. RADIATION DOSE REDUCTION: This exam was performed according to the departmental dose-optimization program which includes automated exposure control, adjustment of the mA and/or kV according to patient size and/or use of iterative reconstruction technique. CONTRAST:  OMNIPAQUE IOHEXOL 350 MG/ML SOLN COMPARISON:  Chest CTA 05/08/2016 FINDINGS: CT CHEST FINDINGS Cardiovascular: The heart size is normal. No substantial pericardial effusion. Mild atherosclerotic calcification is noted in the wall of the thoracic aorta. Mediastinum/Nodes: No mediastinal lymphadenopathy. There is no hilar lymphadenopathy. The esophagus has normal imaging features. There is no axillary lymphadenopathy. Lungs/Pleura: No suspicious pulmonary nodule or mass. No focal airspace consolidation. No pleural effusion. Musculoskeletal: No acute bony abnormality. No evidence for rib fracture CT ABDOMEN PELVIS FINDINGS Hepatobiliary: 3 mm hypervascular focus in the dome of the liver is much too small to characterize but statistically is most likely benign etiology such as flash filling hemangioma or mass scatter malformation. There is no evidence for gallstones, gallbladder wall thickening, or pericholecystic fluid. No intrahepatic or extrahepatic biliary dilation. Pancreas: No focal mass lesion. No dilatation of the main duct. No intraparenchymal cyst. No peripancreatic edema. Spleen: No splenomegaly. No focal mass lesion. Adrenals/Urinary Tract: 17 mm left adrenal nodule shows relative washout of 43%, suggesting benign adrenal adenoma. No followup imaging is recommended. Right adrenal gland unremarkable. Kidneys unremarkable. Stomach/Bowel: Stomach is unremarkable. No gastric wall thickening. No evidence of outlet obstruction. Duodenum is normally positioned as is the ligament of Treitz. No small bowel or colonic dilatation within the visualized abdomen. Vascular/Lymphatic: There is mild atherosclerotic calcification of the  abdominal aorta without aneurysm. There is no gastrohepatic or hepatoduodenal ligament lymphadenopathy. No retroperitoneal or mesenteric lymphadenopathy. Other: No intraperitoneal free fluid. Musculoskeletal: No worrisome lytic or sclerotic osseous abnormality. IMPRESSION: 1. No acute findings in the chest or abdomen. Specifically, no findings to explain the patient's history of right upper quadrant pain. 2. 3 mm hypervascular focus in the dome of the liver is much too small to characterize but statistically is most likely benign etiology such as flash filling hemangioma or mass scatter malformation. No followup imaging is recommended. 3.  Aortic Atherosclerosis (ICD10-I70.0). Electronically Signed   By: Kennith Center M.D.   On: 11/16/2022 12:55    Procedures Procedures    Medications Ordered in ED Medications  sodium chloride 0.9 % bolus 1,000 mL (1,000 mLs Intravenous New Bag/Given 11/16/22 0821)  morphine (PF) 4 MG/ML injection 4 mg (4 mg Intravenous Given 11/16/22 0821)  iohexol (OMNIPAQUE) 350 MG/ML injection 100 mL (100 mLs Intravenous Contrast Given 11/16/22 1239)    ED Course/ Medical Decision Making/ A&P Clinical Course as of 11/16/22 1309  Tue Nov 16, 2022  0822 Initial EKG reviewed and shows some diffuse ST elevation Call placed to Dr. Lynnette Caffey - left message asking to  review EKG [AL]  0836 Call made to Dr. Lynnette Caffey [AL]  [phone] Page sent to Dr. Shari Prows and she placed a call back.  EKG was reviewed and agreed that the patient had diffuse ST segment elevation presenting more like a pericarditis type picture with really no reciprocal changes.  We were able to discover that the patient is a [place] cardiology patient.  However, Dr. Shari Prows confirmed that this was not a STEMI and agreed that the EKG does look abnormal when compared to prior. [AL]  [facility] cardiology updated, case discussed with Dr.Tolia  No concerns regarding the EKG at this time considering troponins are negative and  minimal changes noted [AL]    Clinical Course User Index [AL] Merton Wadlow, DO                             Medical Decision Making 72 year old male presenting with pain radiating to his right shoulder/scapula for the last 2 days.  Differential could include MI, pneumonia, gallbladder disease, muscle strain, shingles, and others.  Lab work and imaging ordered to rule out any acute process.  Initial EKG is concerning for ST elevation diffusely.  No recent illnesses or other HPI details consistent with pericarditis.  Calls made to cardiology to evaluate the EKG and confirm this was not an active STEMI at this time however was abnormal and warranted further workup. Patient's EKG was also discussed with the [facility] cardiologist on-call, Dr. Odis Hollingshead.  We are reassured that the patient is not having any cardiac etiology causing the shoulder discomfort.  Troponin within normal limits.  Patient had a CT scan of the chest and abdomen.  Looking for any pleural effusions, gallbladder disorder, liver disorder, or other causes of his discomfort.  The CT scans were unremarkable at this time.  He was updated on the incidental findings.  Patient reassured that his workup did not show anything and felt comfortable going home at this time.  He did state that his symptoms improved after Tylenol and heating pad.  Likely musculoskeletal in nature.  However strict return precautions were given.  Amount and/or Complexity of Data Reviewed Labs: ordered. Radiology: ordered. ECG/medicine tests:     Details: EKG 814am: shows normal sinus rhythm, diffuse ST segment elevation, normal intervals, no significant reciprocal changes EKG 8:33 AM: Normal sinus rhythm rate 70, diffuse ST segment elevation without reciprocal changes, normal intervals  Risk Prescription drug management.          Final Clinical Impression(s) / ED Diagnoses Final diagnoses:  Right shoulder pain, unspecified chronicity  Musculoskeletal chest  pain    Rx / DC Orders ED Discharge Orders     None         Daegon Deiss, DO 11/16/22 1309

## 2022-11-18 ENCOUNTER — Telehealth: Payer: Self-pay

## 2022-11-18 NOTE — Telephone Encounter (Signed)
Called to follow up with patient after ED visit. He went in for pain under his right shoulder blade and radiated up and under his breast. Denies SOB and chest pain currently. Still experiencing the back pain on and off. Patient agreed to come in for a follow up with Dr. Odis Hollingshead. Transferred him to the front desk to have this scheduled.

## 2022-11-22 DIAGNOSIS — R42 Dizziness and giddiness: Secondary | ICD-10-CM | POA: Diagnosis not present

## 2022-11-29 ENCOUNTER — Ambulatory Visit: Payer: Medicare Other | Admitting: Cardiology

## 2022-11-29 ENCOUNTER — Encounter: Payer: Self-pay | Admitting: Cardiology

## 2022-11-29 VITALS — BP 136/82 | HR 83 | Ht 69.0 in | Wt 198.0 lb

## 2022-11-29 DIAGNOSIS — R072 Precordial pain: Secondary | ICD-10-CM

## 2022-11-29 DIAGNOSIS — I10 Essential (primary) hypertension: Secondary | ICD-10-CM | POA: Diagnosis not present

## 2022-11-29 DIAGNOSIS — G4730 Sleep apnea, unspecified: Secondary | ICD-10-CM

## 2022-11-29 DIAGNOSIS — Z87891 Personal history of nicotine dependence: Secondary | ICD-10-CM

## 2022-11-29 DIAGNOSIS — E782 Mixed hyperlipidemia: Secondary | ICD-10-CM

## 2022-11-29 DIAGNOSIS — E1165 Type 2 diabetes mellitus with hyperglycemia: Secondary | ICD-10-CM | POA: Diagnosis not present

## 2022-11-29 MED ORDER — METOPROLOL TARTRATE 25 MG PO TABS: 25.0000 mg | ORAL_TABLET | Freq: Two times a day (BID) | ORAL | 0 refills | Status: DC

## 2022-11-29 NOTE — Progress Notes (Signed)
Date:  11/29/2022   ID:  Caleb [name], DOB Sep 13, 1950, MRN [id number]  PCP:  Asencion Gowda.August Saucer, MD  Cardiologist:  Tessa Lerner, DO, [facility] (established care 12/23/2020)  Date: 11/29/22 Last Office Visit: 01/30/2021  Chief Complaint  Patient presents with   Hospitalization Follow-up    Reevaluation of chest pain     HPI  Caleb [name] is a 72 y.o. male whose past medical history and cardiovascular risk factors include: Hypertension, Aortic atherosclerosis, hypercholesterolemia, type 2 diabetes, sleep apnea not on  CPAP, family history of colon cancer, former smoker, advance age.   Patient presents to the office for evaluation of chest pain.  He was recently seen in [facility] emergency room department for chest pain/right shoulder pain.  High sensitive troponins were negative x 2.  EKG did not illustrate myocardial injury pattern.  He is here for follow-up.  Patient states that he has right-sided anterior precordial discomfort and right shoulder pain.  The symptoms last for a few seconds, intensity 4/10, last episode was earlier this morning (short-lived), not brought on by effort related activities, does not resolve with rest, self-limited.   FUNCTIONAL STATUS: Patient remains active for his age but no structured exercise program or daily routine.  ALLERGIES: Allergies  Allergen Reactions   Atorvastatin Calcium     Other Reaction(s): multiple perceived effects (05/2017   Simvastatin     Fatigue and myalgias  Other Reaction(s): fatigue and ache   Tramadol Hcl     Other reaction(s): GI upset    MEDICATION LIST PRIOR TO VISIT: Current Meds  Medication Sig   ferrous sulfate 324 MG TBEC Take 324 mg by mouth daily with breakfast. 5 days a week   fexofenadine (ALLEGRA) 180 MG tablet Take 180 mg by mouth daily as needed for allergies.   glimepiride (AMARYL) 4 MG tablet Take 4 mg by mouth every morning.   losartan (COZAAR) 25 MG tablet Take 25 mg by mouth daily.    metFORMIN (GLUCOPHAGE) 1000 MG tablet TAKE ONE TABLET BY MOUTH TWICE A DAY FOR DIABETES (ANNUAL KIDNEY FUNCTION TESTING IS NEEDED)   metoprolol tartrate (LOPRESSOR) 25 MG tablet Take 1 tablet (25 mg total) by mouth 2 (two) times daily. Stop after coronary CTA.   Multiple Vitamins-Minerals (MULTIVITAMIN WITH MINERALS) tablet Take 1 tablet by mouth daily.   pantoprazole (PROTONIX) 20 MG tablet Take 1 tablet (20 mg total) by mouth daily.   rosuvastatin (CRESTOR) 10 MG tablet Take 10 mg by mouth 3 (three) times a week. MWF   vitamin B-12 (CYANOCOBALAMIN) 500 MCG tablet Take 1 tablet by mouth daily.     PAST MEDICAL HISTORY: Past Medical History:  Diagnosis Date   High cholesterol    Sleep apnea    Type II diabetes mellitus (HCC)     PAST SURGICAL HISTORY: Past Surgical History:  Procedure Laterality Date   LEFT HEART CATHETERIZATION WITH CORONARY ANGIOGRAM N/A 05/04/2013   Procedure: LEFT HEART CATHETERIZATION WITH CORONARY ANGIOGRAM;  Surgeon: Pamella Pert, MD;  Location: [facility] CATH LAB;  Service: Cardiovascular;  Laterality: N/A;   NO PAST SURGERIES     ROTATOR CUFF REPAIR Right 2020    FAMILY HISTORY: The patient family history includes Cancer in his brother, father, and mother; Diabetes in his brother, brother, sister, and sister; Hypertension in his brother, sister, and sister.  SOCIAL HISTORY:  The patient  reports that he quit smoking about 44 years ago. His smoking use included cigarettes. He has a 2.40 pack-year smoking history. He  has never used smokeless tobacco. He reports that he does not drink alcohol and does not use drugs.  REVIEW OF SYSTEMS: Review of Systems  Constitutional: Negative for chills and fever.  HENT:  Negative for hoarse voice and nosebleeds.   Eyes:  Negative for discharge, double vision and pain.  Cardiovascular:  Positive for chest pain (no active pain). Negative for claudication, dyspnea on exertion, leg swelling, near-syncope, orthopnea,  palpitations, paroxysmal nocturnal dyspnea and syncope.  Respiratory:  Negative for hemoptysis and shortness of breath.   Musculoskeletal:  Negative for muscle cramps and myalgias.  Gastrointestinal:  Negative for abdominal pain, constipation, diarrhea, hematemesis, hematochezia, melena, nausea and vomiting.  Neurological:  Negative for dizziness and light-headedness.    PHYSICAL EXAM:    11/29/2022    1:34 PM 11/16/2022   12:15 PM 11/16/2022    8:45 AM  Vitals with BMI  Height 5\' 9"     Weight 198 lbs    BMI 29.23    Systolic 136 159   Diastolic 82 81   Pulse 83 65 64   Physical Exam  Constitutional: No distress.  Age appropriate, hemodynamically stable.   Neck: No JVD present.  Cardiovascular: Normal rate, regular rhythm, S1 normal, S2 normal, intact distal pulses and normal pulses. Exam reveals no gallop, no S3 and no S4.  No murmur heard. Pulmonary/Chest: Effort normal and breath sounds normal. No stridor. He has no wheezes. He has no rales.  Abdominal: Soft. Bowel sounds are normal. He exhibits no distension. There is no abdominal tenderness.  Musculoskeletal:        General: No edema.     Cervical back: Neck supple.  Neurological: He is alert and oriented to person, place, and time. He has intact cranial nerves (2-12).  Skin: Skin is warm and moist.   CARDIAC DATABASE: EKG: Nov 29, 2022: Sinus rhythm, 77 bpm, without underlying ischemia or injury pattern.  Echocardiogram: 12/23/2020:  Left ventricle cavity is normal in size. Moderate concentric hypertrophy of the left ventricle. Normal global wall motion. Normal LV systolic  function with EF 55%. Doppler evidence of grade I (impaired) diastolic dysfunction, normal LAP.  Trileaflet aortic valve.  Mild (Grade I) aortic regurgitation.  Moderate (Grade II) mitral regurgitation.  Normal right atrial pressure.    Stress Testing: Exercise Sestamibi Stress Test 01/19/2021: Normal ECG stress. The patient exercised for 7 minutes  and 26 seconds of a Bruce protocol, achieving approximately 9.26 METs. Normal BP. Peak EKG/ECG revealed no ST-T wave abnormalities. During exercise the peak ECG revealed no arrhythmias. Exercise capacity low.  Myocardial perfusion is normal. Overall LV systolic function is normal without regional wall motion abnormalities. Stress LV EF: 67%.  No previous exam available for comparison. Low risk.   Heart Catheterization: 2014: Normal coronary arteries with left dominant circulation with normal left systolic function.  LABORATORY DATA:    Latest Ref Rng & Units 11/16/2022    8:20 AM 07/28/2022    2:31 PM 12/11/2020   10:45 AM  CBC  WBC 4.0 - 10.5 K/uL 4.7  6.1  6.3   Hemoglobin 13.0 - 17.0 g/dL 16.1  09.6  04.5   Hematocrit 39.0 - 52.0 % 40.6  42.3  41.9   Platelets 150 - 400 K/uL 282  311  312        Latest Ref Rng & Units 11/16/2022    8:20 AM 07/28/2022    2:31 PM 12/23/2020    2:36 PM  CMP  Glucose 70 - 99 mg/dL  290  151  120   BUN 8 - 23 mg/dL 10  11  8    Creatinine 0.61 - 1.24 mg/dL 8.65  7.84  6.96   Sodium 135 - 145 mmol/L 132  137  140   Potassium 3.5 - 5.1 mmol/L 4.7  4.3  4.7   Chloride 98 - 111 mmol/L 98  105  102   CO2 22 - 32 mmol/L 26  24  22    Calcium 8.9 - 10.3 mg/dL 9.3  9.3  9.9   Total Protein 6.5 - 8.1 g/dL 6.8  7.3    Total Bilirubin 0.3 - 1.2 mg/dL 0.6  0.7    Alkaline Phos 38 - 126 U/L 41  42    AST 15 - 41 U/L 20  24    ALT 0 - 44 U/L 25  18      Lipid Panel     Component Value Date/Time   CHOL  02/05/2009 0520    147        ATP III CLASSIFICATION:  <200     mg/dL   Desirable  295-284  mg/dL   Borderline High  >=132    mg/dL   High          TRIG 440 02/05/2009 0520   HDL 24 (L) 02/05/2009 0520   CHOLHDL 6.1 02/05/2009 0520   VLDL 29 02/05/2009 0520   LDLCALC  02/05/2009 0520    94        Total Cholesterol/HDL:CHD Risk Coronary Heart Disease Risk Table                     Men   Women  1/2 Average Risk   3.4   3.3  Average Risk       5.0    4.4  2 X Average Risk   9.6   7.1  3 X Average Risk  23.4   11.0        Use the calculated Patient Ratio above and the CHD Risk Table to determine the patient's CHD Risk.        ATP III CLASSIFICATION (LDL):  <100     mg/dL   Optimal  102-725  mg/dL   Near or Above                    Optimal  130-159  mg/dL   Borderline  366-440  mg/dL   High  >347     mg/dL   Very High    No components found for: "NTPROBNP" No results for input(s): "PROBNP" in the last 8760 hours. No results for input(s): "TSH" in the last 8760 hours.  BMP Recent Labs    07/28/22 1431 11/16/22 0820  NA 137 132*  K 4.3 4.7  CL 105 98  CO2 24 26  GLUCOSE 151* 290*  BUN 11 10  CREATININE 0.81 0.97  CALCIUM 9.3 9.3  GFRNONAA >60 >60    HEMOGLOBIN A1C Lab Results  Component Value Date   HGBA1C 6.1 (H) 05/04/2013   MPG 128 (H) 05/04/2013    External Labs: Collected: 07/02/2020 Creatinine 0.83 mg/dL. eGFR: >60 mL/min per 1.73 m Potassium level: 4 Lipid profile: Total cholesterol 160, triglycerides 98, HDL 41, LDL 101, non-HDL 119 Hemoglobin A1c: 7.3  IMPRESSION:    ICD-10-CM   1. Precordial pain  R07.2 EKG 12-Lead    PCV ECHOCARDIOGRAM COMPLETE    CT CORONARY MORPH W/CTA COR W/SCORE W/CA W/CM &/OR WO/CM  metoprolol tartrate (LOPRESSOR) 25 MG tablet    2. Benign hypertension  I10     3. Mixed hyperlipidemia  E78.2     4. Type 2 diabetes mellitus with hyperglycemia, without long-term current use of insulin (HCC)  E11.65     5. Sleep apnea in adult  G47.30     6. Former smoker  Z87.891        RECOMMENDATIONS: Caleb [name] is a 72 y.o. male whose past medical history and cardiac risk factors include: Hypertension, hypercholesterolemia, type 2 diabetes, sleep apnea on CPAP, family history of colon cancer, former smoker, advance age.   Precordial pain Predominately noncardiac based on symptoms. Recent ER documentation reviewed. High sensitive troponins were negative x 2. EKG  today is nonischemic. Discussed the role of coronary CTA, patient would like to proceed forward. Lopressor 25 mg p.o. twice daily until the CT scan is done. Echo will be ordered to evaluate for structural heart disease and left ventricular systolic function. [facility] at last visit discussed noncardiac causes of right-sided chest pain  Benign hypertension Office blood pressures within acceptable limits. Medications reconciled. No changes warranted  Mixed hyperlipidemia Currently on rosuvastatin.   He denies myalgia or other side effects. He will bring a copy at the next office visit.  Currently managed by primary care provider.  Type 2 diabetes mellitus with hyperglycemia, without long-term current use of insulin (HCC) Educated him on the importance of glycemic control. Currently on ARB, statin therapy  Sleep apnea in adult Currently not using his CPAP on a regular basis. Encouraged compliance   FINAL MEDICATION LIST END OF ENCOUNTER: Meds ordered this encounter  Medications   metoprolol tartrate (LOPRESSOR) 25 MG tablet    Sig: Take 1 tablet (25 mg total) by mouth 2 (two) times daily. Stop after coronary CTA.    Dispense:  60 tablet    Refill:  0    Medications Discontinued During This Encounter  Medication Reason   hydrochlorothiazide (HYDRODIURIL) 25 MG tablet Completed Course   fluticasone (FLONASE) 50 MCG/ACT nasal spray Completed Course   Cholecalciferol 25 MCG (1000 UT) tablet Completed Course     Current Outpatient Medications:    ferrous sulfate 324 MG TBEC, Take 324 mg by mouth daily with breakfast. 5 days a week, Disp: , Rfl:    fexofenadine (ALLEGRA) 180 MG tablet, Take 180 mg by mouth daily as needed for allergies., Disp: , Rfl: 5   glimepiride (AMARYL) 4 MG tablet, Take 4 mg by mouth every morning., Disp: , Rfl:    losartan (COZAAR) 25 MG tablet, Take 25 mg by mouth daily., Disp: , Rfl:    metFORMIN (GLUCOPHAGE) 1000 MG tablet,  TAKE ONE TABLET BY MOUTH TWICE A DAY FOR DIABETES (ANNUAL KIDNEY FUNCTION TESTING IS NEEDED), Disp: , Rfl:    metoprolol tartrate (LOPRESSOR) 25 MG tablet, Take 1 tablet (25 mg total) by mouth 2 (two) times daily. Stop after coronary CTA., Disp: 60 tablet, Rfl: 0   Multiple Vitamins-Minerals (MULTIVITAMIN WITH MINERALS) tablet, Take 1 tablet by mouth daily., Disp: , Rfl:    pantoprazole (PROTONIX) 20 MG tablet, Take 1 tablet (20 mg total) by mouth daily., Disp: 30 tablet, Rfl: 0   rosuvastatin (CRESTOR) 10 MG tablet, Take 10 mg by mouth 3 (three) times a week. MWF, Disp: , Rfl:    vitamin B-12 (CYANOCOBALAMIN) 500 MCG tablet, Take 1 tablet by mouth daily., Disp: , Rfl:   Orders Placed This Encounter  Procedures  CT CORONARY MORPH W/CTA COR W/SCORE W/CA W/CM &/OR WO/CM   EKG 12-Lead   PCV ECHOCARDIOGRAM COMPLETE    There are no Patient Instructions on file for this visit.   --Continue cardiac medications as reconciled in final medication list. --Return in about 8 weeks (around 01/24/2023) for Reevaluation of, Chest pain, Review test results. Or sooner if needed. --Continue follow-up with your primary care physician regarding the management of your other chronic comorbid conditions.  Patient's questions and concerns were addressed to his satisfaction. He voices understanding of the instructions provided during this encounter.   This note was created using a voice recognition software as a result there may be grammatical errors inadvertently enclosed that do not reflect the nature of this encounter. Every attempt is made to correct such errors.  Tessa Lerner, [place], [facility]  Pager:  [phone] Office: [phone]

## 2022-12-07 DIAGNOSIS — E119 Type 2 diabetes mellitus without complications: Secondary | ICD-10-CM | POA: Diagnosis not present

## 2022-12-07 DIAGNOSIS — R972 Elevated prostate specific antigen [PSA]: Secondary | ICD-10-CM | POA: Diagnosis not present

## 2022-12-07 DIAGNOSIS — E7800 Pure hypercholesterolemia, unspecified: Secondary | ICD-10-CM | POA: Diagnosis not present

## 2022-12-07 DIAGNOSIS — I10 Essential (primary) hypertension: Secondary | ICD-10-CM | POA: Diagnosis not present

## 2022-12-08 ENCOUNTER — Encounter: Payer: Medicare Other | Attending: Infectious Diseases | Admitting: Dietician

## 2022-12-08 ENCOUNTER — Encounter: Payer: Self-pay | Admitting: Dietician

## 2022-12-08 VITALS — Ht 69.0 in | Wt 197.0 lb

## 2022-12-08 DIAGNOSIS — I10 Essential (primary) hypertension: Secondary | ICD-10-CM | POA: Diagnosis not present

## 2022-12-08 DIAGNOSIS — Z713 Dietary counseling and surveillance: Secondary | ICD-10-CM | POA: Diagnosis not present

## 2022-12-08 NOTE — Patient Instructions (Signed)
Aim for 150 minutes of physical activity weekly. Goal: go walking for 30 minutes at least 3 days per week.   Aim to make 1/2 of your plate vegetables at least 2x/day  Goal: switch any sodas to diet soda.  Rethink what you drink. Choose beverages without added sugar. Look for 0 carbs on the label.  Aim to eat within 1-2 hours of waking up and every 3-5 hours following.

## 2022-12-08 NOTE — Progress Notes (Signed)
Medical Nutrition Therapy  Appointment Start time:  [id number]  Appointment End time:  1505   Referral diagnosis: primary hypertension Preferred learning style: no preference indicated Learning readiness: ready   NUTRITION ASSESSMENT   Anthropometrics  Ht: 69 in Wt: 197 lbs  Clinical Medical Hx: type 2 diabetes, sleep apnea, HLD, HTN, sleep apnea Medications: metformin, glimepiride Labs: 11/30/22 A1c 8.8%,  Notable Signs/Symptoms: none reported Food Allergies: none reported  Lifestyle & Dietary Hx  Pt states praying helps reduce his stress or anxieties.   Pt states he wants to get off of some of his medication.   Pt states in February his A1c was 7.3% and most recent is 8.8%.  Pt works 9am-1pm. Pt states he does not eat at work but sometimes takes juice to work.   Pt states he used to go walking but hasn't been doing it much   Estimated daily fluid intake: 16-32 oz Supplements: iron, vitamin b12, MVI, vitamin D Sleep: not assessed Stress / self-care: moderate stress Current average weekly physical activity: ADLs  24-Hr Dietary Recall First Meal: 7am: banana and bowl of cheerios and 3 oz orange juice Snack: 8:30am: coffee and steak biscuit from [employer] Second Meal: 12 oz orange juice Snack: none Third Meal: roasted [place] on honey wheat Snack: 7pm: bowl of cereal Beverages: coffee, orange juice, occasional soda (if going through drive through), sprite a few times per week, water, occasional cranberry juice   NUTRITION DIAGNOSIS  NB-1.1 Food and nutrition-related knowledge deficit As related to lack of prior nutrition education by a nutrition professional.  As evidenced by pt report.   NUTRITION INTERVENTION  Nutrition education (E-1) on the following topics:   Managing Type 2 diabetes through nutrition involves several key strategies: -Understanding Carbohydrates: Recognize carbohydrate-rich foods, control portions, and choose low-glycemic index  options. -Balanced Diet: Incorporate proteins, healthy fats, and fiber from diverse food sources. -Meal Planning: Eat at regular intervals, control portions, and choose healthy snacks. -Reducing Sugar Intake: Limit sugary foods and drinks, including those with hidden sugars. -Hydration: Stay hydrated with water and non-caloric beverages. -Consistent Monitoring: Regularly monitor blood sugar levels and adjust diet accordingly. -Professional Guidance: Seek advice from dietitians and diabetes educators for personalized meal plans. -Combining these nutritional strategies with regular physical activity can help effectively manage Type 2 diabetes and improve overall health. -Insulin resistance and A1c goals. Explained how insulin resistance works and pathophysiology of type 2 diabetes. -Walked pt through and interactive 'build a grocery list' activity.   Handouts Provided Include  Plate Method Grocery List Meal Ideas Snack Ideas  Learning Style & Readiness for Change Teaching method utilized: Visual & Auditory  Demonstrated degree of understanding via: Teach Back  Barriers to learning/adherence to lifestyle change: none  Goals Established by Pt Aim for 150 minutes of physical activity weekly. Goal: go walking for 30 minutes at least 3 days per week.   Aim to make 1/2 of your plate vegetables at least 2x/day  Goal: switch any sodas to diet soda.  Rethink what you drink. Choose beverages without added sugar. Look for 0 carbs on the label.  Aim to eat within 1-2 hours of waking up and every 3-5 hours following.    MONITORING & EVALUATION Dietary intake, weekly physical activity, and follow up in 3 months.  Next Steps  Patient is to call for questions.

## 2022-12-14 ENCOUNTER — Telehealth (HOSPITAL_COMMUNITY): Payer: Self-pay | Admitting: *Deleted

## 2022-12-14 NOTE — Telephone Encounter (Signed)
Attempted to call patient regarding upcoming cardiac CT appointment. °Left message on voicemail with name and callback number ° °Emmanuell Kantz RN Navigator Cardiac Imaging °[facility] °[phone] Office °[phone] Cell ° °

## 2022-12-15 ENCOUNTER — Ambulatory Visit (HOSPITAL_COMMUNITY)
Admission: RE | Admit: 2022-12-15 | Discharge: 2022-12-15 | Disposition: A | Discharge status: Home / Self Care | Payer: Medicare Other | Source: Ambulatory Visit | Attending: Family Medicine | Admitting: Family Medicine

## 2022-12-15 DIAGNOSIS — R072 Precordial pain: Secondary | ICD-10-CM

## 2022-12-15 MED ORDER — NITROGLYCERIN 0.4 MG SL SUBL
SUBLINGUAL_TABLET | SUBLINGUAL | Status: AC
  Filled 2022-12-15: qty 2

## 2022-12-15 MED ORDER — IOHEXOL 350 MG/ML SOLN
95.0000 mL | Freq: Once | INTRAVENOUS | Status: AC | PRN
  Administered 2022-12-15: 95 mL via INTRAVENOUS

## 2022-12-15 MED ORDER — NITROGLYCERIN 0.4 MG SL SUBL
0.8000 mg | SUBLINGUAL_TABLET | SUBLINGUAL | Status: DC | PRN
  Administered 2022-12-15: 0.8 mg via SUBLINGUAL

## 2022-12-19 DIAGNOSIS — R072 Precordial pain: Secondary | ICD-10-CM | POA: Insufficient documentation

## 2022-12-19 DIAGNOSIS — I2510 Atherosclerotic heart disease of native coronary artery without angina pectoris: Secondary | ICD-10-CM | POA: Diagnosis not present

## 2022-12-21 ENCOUNTER — Other Ambulatory Visit: Payer: Self-pay | Admitting: Cardiology

## 2022-12-21 DIAGNOSIS — R072 Precordial pain: Secondary | ICD-10-CM

## 2022-12-23 NOTE — Progress Notes (Signed)
Called and spoke with patient regarding his CT coronary score results. I also reminded patient that he has an upcoming echocardiogram test and then an OV on 01/14/2023

## 2022-12-29 ENCOUNTER — Ambulatory Visit: Payer: Medicare Other

## 2022-12-29 DIAGNOSIS — R072 Precordial pain: Secondary | ICD-10-CM

## 2023-01-14 ENCOUNTER — Ambulatory Visit: Payer: Medicare Other | Admitting: Cardiology

## 2023-01-19 NOTE — Progress Notes (Signed)
Called and spoke with patient regarding his echocardiogram results. Appointment confirmed.

## 2023-01-24 ENCOUNTER — Ambulatory Visit: Payer: Medicare Other | Admitting: Cardiology

## 2023-01-26 ENCOUNTER — Ambulatory Visit: Payer: Medicare Other | Admitting: Cardiology

## 2023-01-26 ENCOUNTER — Encounter: Payer: Self-pay | Admitting: Cardiology

## 2023-01-26 VITALS — BP 134/79 | HR 89 | Resp 16 | Ht 69.0 in | Wt 192.8 lb

## 2023-01-26 DIAGNOSIS — E1165 Type 2 diabetes mellitus with hyperglycemia: Secondary | ICD-10-CM

## 2023-01-26 DIAGNOSIS — I2510 Atherosclerotic heart disease of native coronary artery without angina pectoris: Secondary | ICD-10-CM | POA: Diagnosis not present

## 2023-01-26 DIAGNOSIS — E782 Mixed hyperlipidemia: Secondary | ICD-10-CM

## 2023-01-26 DIAGNOSIS — I10 Essential (primary) hypertension: Secondary | ICD-10-CM | POA: Diagnosis not present

## 2023-01-26 DIAGNOSIS — R931 Abnormal findings on diagnostic imaging of heart and coronary circulation: Secondary | ICD-10-CM | POA: Diagnosis not present

## 2023-01-26 DIAGNOSIS — Z87891 Personal history of nicotine dependence: Secondary | ICD-10-CM

## 2023-01-26 DIAGNOSIS — G4730 Sleep apnea, unspecified: Secondary | ICD-10-CM

## 2023-01-26 NOTE — Progress Notes (Signed)
Date:  01/26/2023   ID:  Caleb [name], DOB 1951-03-07, MRN [id number]  PCP:  Clovis Riley, Elbert Ewings.August Saucer, MD  Cardiologist:  Tessa Lerner, DO, [facility] (established care 12/23/2020)  Date: 01/26/23 Last Office Visit: 11/29/2022  Chief Complaint  Patient presents with   Follow-up    Evaluation of chest pain and discuss test results    HPI  Caleb [name] is a 72 y.o. male whose past medical history and cardiovascular risk factors include: Hypertension, Aortic atherosclerosis, hypercholesterolemia, type 2 diabetes, sleep apnea not on  CPAP, family history of colon cancer, former smoker, advance age.   Patient was recently seen at [facility], ED for chest pain and right shoulder pain.  High sensitive troponins were negative x 2 and follow-up EKG did not illustrate myocardial injury pattern.  At the last office visit the shared decision was to proceed with echocardiogram and coronary CTA for further evaluation.  He presents today for follow-up.  Last office visit he started seeing a dietitian  with the goals of eating more healthy.  He is also walking more.  Denies anginal chest pain at rest or with effort related activities.  FUNCTIONAL STATUS: Patient remains active for his age but no structured exercise program or daily routine.  ALLERGIES: Allergies  Allergen Reactions   Atorvastatin Calcium     Other Reaction(s): multiple perceived effects (05/2017   Simvastatin     Fatigue and myalgias  Other Reaction(s): fatigue and ache   Tramadol Hcl     Other reaction(s): GI upset    MEDICATION LIST PRIOR TO VISIT: Current Meds  Medication Sig   ferrous sulfate 324 MG TBEC Take 324 mg by mouth daily with breakfast. 5 days a week   fexofenadine (ALLEGRA) 180 MG tablet Take 180 mg by mouth daily as needed for allergies.   glimepiride (AMARYL) 4 MG tablet Take 4 mg by mouth every morning.   losartan (COZAAR) 25 MG tablet Take 25 mg by mouth daily.   metFORMIN (GLUCOPHAGE) 1000 MG tablet TAKE  ONE TABLET BY MOUTH TWICE A DAY FOR DIABETES (ANNUAL KIDNEY FUNCTION TESTING IS NEEDED)   Multiple Vitamins-Minerals (MULTIVITAMIN WITH MINERALS) tablet Take 1 tablet by mouth daily.   rosuvastatin (CRESTOR) 10 MG tablet Take 10 mg by mouth 3 (three) times a week. MWF   vitamin B-12 (CYANOCOBALAMIN) 500 MCG tablet Take 1 tablet by mouth daily.     PAST MEDICAL HISTORY: Past Medical History:  Diagnosis Date   High cholesterol    Sleep apnea    Type II diabetes mellitus (HCC)     PAST SURGICAL HISTORY: Past Surgical History:  Procedure Laterality Date   LEFT HEART CATHETERIZATION WITH CORONARY ANGIOGRAM N/A 05/04/2013   Procedure: LEFT HEART CATHETERIZATION WITH CORONARY ANGIOGRAM;  Surgeon: Pamella Pert, MD;  Location: [facility] CATH LAB;  Service: Cardiovascular;  Laterality: N/A;   NO PAST SURGERIES     ROTATOR CUFF REPAIR Right 2020    FAMILY HISTORY: The patient family history includes Cancer in his brother, father, and mother; Diabetes in his brother, brother, sister, and sister; Hypertension in his brother, sister, and sister.  SOCIAL HISTORY:  The patient  reports that he quit smoking about 44 years ago. His smoking use included cigarettes. He started smoking about 64 years ago. He has a 2.4 pack-year smoking history. He has never used smokeless tobacco. He reports that he does not drink alcohol and does not use drugs.  REVIEW OF SYSTEMS: Review of Systems  Cardiovascular:  Negative for chest  pain, claudication, dyspnea on exertion, irregular heartbeat, leg swelling, near-syncope, orthopnea, palpitations, paroxysmal nocturnal dyspnea and syncope.  Respiratory:  Negative for shortness of breath.   Hematologic/Lymphatic: Negative for bleeding problem.  Musculoskeletal:  Negative for muscle cramps and myalgias.  Neurological:  Negative for dizziness and light-headedness.    PHYSICAL EXAM:    01/26/2023    2:59 PM 12/15/2022    2:21 PM 12/15/2022    1:00 PM  Vitals with BMI   Height 5\' 9"     Weight 192 lbs 13 oz    BMI 28.46    Systolic 134 114 621  Diastolic 79 58 69  Pulse 89 58 65   Physical Exam  Constitutional: No distress.  Age appropriate, hemodynamically stable.   Neck: No JVD present.  Cardiovascular: Normal rate, regular rhythm, S1 normal, S2 normal, intact distal pulses and normal pulses. Exam reveals no gallop, no S3 and no S4.  No murmur heard. Pulmonary/Chest: Effort normal and breath sounds normal. No stridor. He has no wheezes. He has no rales.  Abdominal: Soft. Bowel sounds are normal. He exhibits no distension. There is no abdominal tenderness.  Musculoskeletal:        General: No edema.     Cervical back: Neck supple.  Neurological: He is alert and oriented to person, place, and time. He has intact cranial nerves (2-12).  Skin: Skin is warm and moist.   CARDIAC DATABASE: EKG: Nov 29, 2022: Sinus rhythm, 77 bpm, without underlying ischemia or injury pattern.  Echocardiogram: 12/29/2022: Normal LV systolic function with visual EF 60-65%. Left ventricle cavity is normal in size. Normal left ventricular wall thickness. Normal global wall motion. Normal diastolic filling pattern, normal LAP.  Mild pulmonic regurgitation. Compared to 12/24/2022: Mild AR/moderate MR are not appreciated on current study. Otherwise no significant change.    Stress Testing: Exercise Sestamibi Stress Test 01/19/2021: Normal ECG stress. The patient exercised for 7 minutes and 26 seconds of a Bruce protocol, achieving approximately 9.26 METs. Normal BP. Peak EKG/ECG revealed no ST-T wave abnormalities. During exercise the peak ECG revealed no arrhythmias. Exercise capacity low.  Myocardial perfusion is normal. Overall LV systolic function is normal without regional wall motion abnormalities. Stress LV EF: 67%.  No previous exam available for comparison. Low risk.   Heart Catheterization: 2014: Normal coronary arteries with left dominant circulation with  normal left systolic function.  CCTA 12/15/2022 1. Total coronary calcium score of 35.3. This was 70 st percentile for age and sex matched control.  2. Normal coronary origin with left dominance.  3. CAD-RADS = 2 Mild non-obstructive.  Left Main: Patent.  LAD: Patent.  LCx: Mild stenosis (25-49%) due to calcified plaque otherwise vessel is patent.  RCA: Mild stenosis (25-49%) due to soft plaque otherwise vessel is patent.  Noncardiac findings: No significant extracardiac incidental findings identified.   RECOMMENDATIONS: Consider non-atherosclerotic causes of chest pain. Consider preventive therapy and risk factor modification.   LABORATORY DATA:    Latest Ref Rng & Units 11/16/2022    8:20 AM 07/28/2022    2:31 PM 12/11/2020   10:45 AM  CBC  WBC 4.0 - 10.5 K/uL 4.7  6.1  6.3   Hemoglobin 13.0 - 17.0 g/dL 30.8  65.7  84.6   Hematocrit 39.0 - 52.0 % 40.6  42.3  41.9   Platelets 150 - 400 K/uL 282  311  312        Latest Ref Rng & Units 11/16/2022    8:20 AM 07/28/2022  2:31 PM 12/23/2020    2:36 PM  CMP  Glucose 70 - 99 mg/dL 161  096  045   BUN 8 - 23 mg/dL 10  11  8    Creatinine 0.61 - 1.24 mg/dL 4.09  8.11  9.14   Sodium 135 - 145 mmol/L 132  137  140   Potassium 3.5 - 5.1 mmol/L 4.7  4.3  4.7   Chloride 98 - 111 mmol/L 98  105  102   CO2 22 - 32 mmol/L 26  24  22    Calcium 8.9 - 10.3 mg/dL 9.3  9.3  9.9   Total Protein 6.5 - 8.1 g/dL 6.8  7.3    Total Bilirubin 0.3 - 1.2 mg/dL 0.6  0.7    Alkaline Phos 38 - 126 U/L 41  42    AST 15 - 41 U/L 20  24    ALT 0 - 44 U/L 25  18      BMP Recent Labs    07/28/22 1431 11/16/22 0820  NA 137 132*  K 4.3 4.7  CL 105 98  CO2 24 26  GLUCOSE 151* 290*  BUN 11 10  CREATININE 0.81 0.97  CALCIUM 9.3 9.3  GFRNONAA >60 >60    HEMOGLOBIN A1C Lab Results  Component Value Date   HGBA1C 6.1 (H) 05/04/2013   MPG 128 (H) 05/04/2013    External Labs: Collected: 07/02/2020 Creatinine 0.83 mg/dL. eGFR: >60 mL/min per  1.73 m Potassium level: 4 Lipid profile: Total cholesterol 160, triglycerides 98, HDL 41, LDL 101, non-HDL 119 Hemoglobin A1c: 7.3  External Labs: Collected: November 2023 available in Care Everywhere from [place] [facility] Total cholesterol 130, triglycerides 176, HDL 37, LDL calculated 58   IMPRESSION:    ICD-10-CM   1. Nonobstructive atherosclerosis of coronary artery  I25.10     2. Agatston coronary artery calcium score less than 100  R93.1     3. Benign hypertension  I10     4. Mixed hyperlipidemia  E78.2     5. Type 2 diabetes mellitus with hyperglycemia, without long-term current use of insulin (HCC)  E11.65     6. Sleep apnea in adult  G47.30     7. Former smoker  Z87.891         RECOMMENDATIONS: Caleb [name] is a 72 y.o. male whose past medical history and cardiac risk factors include: Hypertension, hypercholesterolemia, type 2 diabetes, sleep apnea on CPAP, family history of colon cancer, former smoker, advance age.   Nonobstructive atherosclerosis of coronary artery Agatston coronary artery calcium score less than 100 No chest pain since last office visit.  CCTA notes Total CAC 35.3, 51st percentile and CAD-RADS 2 (mild non-obstructive disease) Echo: LVEF 60-65%, see report for more details.  Reemphasized the importance of secondary prevention with focus on improving her modifiable cardiovascular risk factors such as glycemic control, lipid management, blood pressure control, weight loss.  Benign hypertension Well-controlled. Continue current medical therapy.  No medication changes warranted at this time.  Mixed hyperlipidemia Currently on Crestor  We discussed increasing Crestor to 20mg  po at bedtime to further optimize his lipids given his underlying DM and CAC.  However, he would like to hold off on medication changes for now.  Will reevaluate lipids at the next office visit to see if further medication titration is warranted.  Type 2 diabetes mellitus  with hyperglycemia, without long-term current use of insulin (HCC) Educated him on the importance of glycemic control. Currently on ARB, statin therapy  Sleep apnea in adult Reemphasized the importance of CPAP compliance.  FINAL MEDICATION LIST END OF ENCOUNTER: No orders of the defined types were placed in this encounter.   There are no discontinued medications.    Current Outpatient Medications:    ferrous sulfate 324 MG TBEC, Take 324 mg by mouth daily with breakfast. 5 days a week, Disp: , Rfl:    fexofenadine (ALLEGRA) 180 MG tablet, Take 180 mg by mouth daily as needed for allergies., Disp: , Rfl: 5   glimepiride (AMARYL) 4 MG tablet, Take 4 mg by mouth every morning., Disp: , Rfl:    losartan (COZAAR) 25 MG tablet, Take 25 mg by mouth daily., Disp: , Rfl:    metFORMIN (GLUCOPHAGE) 1000 MG tablet, TAKE ONE TABLET BY MOUTH TWICE A DAY FOR DIABETES (ANNUAL KIDNEY FUNCTION TESTING IS NEEDED), Disp: , Rfl:    Multiple Vitamins-Minerals (MULTIVITAMIN WITH MINERALS) tablet, Take 1 tablet by mouth daily., Disp: , Rfl:    rosuvastatin (CRESTOR) 10 MG tablet, Take 10 mg by mouth 3 (three) times a week. MWF, Disp: , Rfl:    vitamin B-12 (CYANOCOBALAMIN) 500 MCG tablet, Take 1 tablet by mouth daily., Disp: , Rfl:    metoprolol tartrate (LOPRESSOR) 25 MG tablet, TAKE 1 TABLET (25 MG TOTAL) BY MOUTH 2 (TWO) TIMES DAILY. STOP AFTER CORONARY CTA., Disp: 180 tablet, Rfl: 1   pantoprazole (PROTONIX) 20 MG tablet, Take 1 tablet (20 mg total) by mouth daily., Disp: 30 tablet, Rfl: 0  No orders of the defined types were placed in this encounter.   There are no Patient Instructions on file for this visit.   --Continue cardiac medications as reconciled in final medication list. --Return in about 5 months (around 06/27/2023) for Follow up, Coronary artery calcification. Or sooner if needed. --Continue follow-up with your primary care physician regarding the management of your other chronic comorbid  conditions.  Patient's questions and concerns were addressed to his satisfaction. He voices understanding of the instructions provided during this encounter.   This note was created using a voice recognition software as a result there may be grammatical errors inadvertently enclosed that do not reflect the nature of this encounter. Every attempt is made to correct such errors.  Tessa Lerner, [place], [facility]  Pager:  [phone] Office: [phone]

## 2023-02-14 DIAGNOSIS — D509 Iron deficiency anemia, unspecified: Secondary | ICD-10-CM | POA: Diagnosis not present

## 2023-02-17 DIAGNOSIS — E1165 Type 2 diabetes mellitus with hyperglycemia: Secondary | ICD-10-CM | POA: Diagnosis not present

## 2023-02-17 DIAGNOSIS — E7800 Pure hypercholesterolemia, unspecified: Secondary | ICD-10-CM | POA: Diagnosis not present

## 2023-02-17 DIAGNOSIS — E559 Vitamin D deficiency, unspecified: Secondary | ICD-10-CM | POA: Diagnosis not present

## 2023-02-17 DIAGNOSIS — J309 Allergic rhinitis, unspecified: Secondary | ICD-10-CM | POA: Diagnosis not present

## 2023-02-17 DIAGNOSIS — D508 Other iron deficiency anemias: Secondary | ICD-10-CM | POA: Diagnosis not present

## 2023-02-17 DIAGNOSIS — E1169 Type 2 diabetes mellitus with other specified complication: Secondary | ICD-10-CM | POA: Diagnosis not present

## 2023-02-17 DIAGNOSIS — R972 Elevated prostate specific antigen [PSA]: Secondary | ICD-10-CM | POA: Diagnosis not present

## 2023-02-17 DIAGNOSIS — I10 Essential (primary) hypertension: Secondary | ICD-10-CM | POA: Diagnosis not present

## 2023-02-20 NOTE — Progress Notes (Signed)
Appt was rescheduled.   No charge.  Close encounter.   Tessa Lerner, [place], [facility]  Pager:  [phone] Office: [phone]

## 2023-03-10 ENCOUNTER — Encounter: Payer: Medicare Other | Attending: Infectious Diseases | Admitting: Dietician

## 2023-03-10 ENCOUNTER — Encounter: Payer: Self-pay | Admitting: Dietician

## 2023-03-10 DIAGNOSIS — I10 Essential (primary) hypertension: Secondary | ICD-10-CM

## 2023-03-10 DIAGNOSIS — E118 Type 2 diabetes mellitus with unspecified complications: Secondary | ICD-10-CM | POA: Insufficient documentation

## 2023-03-10 DIAGNOSIS — Z713 Dietary counseling and surveillance: Secondary | ICD-10-CM | POA: Insufficient documentation

## 2023-03-10 NOTE — Progress Notes (Signed)
Medical Nutrition Therapy  Appointment Start time:  1350  Appointment End time:  1430   Referral diagnosis: primary hypertension Preferred learning style: no preference indicated Learning readiness: ready   NUTRITION ASSESSMENT   Anthropometrics  Ht: 69 in Wt 03/10/23: 192.6 lbs Wt 12/08/22: 197 lbs  Clinical Medical Hx: type 2 diabetes, sleep apnea, HLD, HTN, sleep apnea Medications: metformin, glimepiride Labs: 11/30/22 A1c 8.8%,  Notable Signs/Symptoms: none reported Food Allergies: none reported  Lifestyle & Dietary Hx  Pt states he has been doing some walking. He states he tries to walk every day depending on the weather. He states he walks at least 3-4 days or all 7 days weather permitting. He states he walks for 30-60 minutes. Walks at 6:30am.   Pt states he was frustrated because his A1c was 7.3% in the past, then it went to 8.8% and states recently it was 8.3%.  Pt states he has been trying to have smaller portions and thinking about the diabetes portion plate. Pt states he has reduced his juice intake.  Pt states he has been trying to drink more water. He states he has been drinking less soda. He reports he has tried sprite zero or coke zero.   Pt states he feels revived.   Pt weight down 4.4 lbs since previous visit.   Estimated daily fluid intake: 32-64 oz Supplements: iron, vitamin b12, MVI, vitamin D Sleep: 7pm-2am Stress / self-care: moderate stress Current average weekly physical activity: walking 3-6 days per week.   24-Hr Dietary Recall First Meal: 8am: oatmeal with eggs and meat Snack: pack of nabs crackers Second Meal: 2pm: k&w vegetable plate: greens, broccoli, and yams.  Snack: none Third Meal: none Snack: 7pm: applesauce and grapes OR [place] and cheese on whole wheat Beverages: coffee, water, little bit of juice at breakfast, occasional diet soda or regular   NUTRITION DIAGNOSIS  NB-1.1 Food and nutrition-related knowledge deficit As related  to lack of prior nutrition education by a nutrition professional.  As evidenced by pt report.   NUTRITION INTERVENTION  Nutrition education (E-1) on the following topics:   Managing Type 2 diabetes through nutrition involves several key strategies: -Understanding Carbohydrates: Recognize carbohydrate-rich foods, control portions, and choose low-glycemic index options. -Balanced Diet: Incorporate proteins, healthy fats, and fiber from diverse food sources. -Meal Planning: Eat at regular intervals, control portions, and choose healthy snacks. -Reducing Sugar Intake: Limit sugary foods and drinks, including those with hidden sugars. -Hydration: Stay hydrated with water and non-caloric beverages. -Consistent Monitoring: Regularly monitor blood sugar levels and adjust diet accordingly. -Professional Guidance: Seek advice from dietitians and diabetes educators for personalized meal plans. -Combining these nutritional strategies with regular physical activity can help effectively manage Type 2 diabetes and improve overall health. -Insulin resistance and A1c goals. Explained how insulin resistance works and pathophysiology of type 2 diabetes. -Walked pt through and interactive 'build a grocery list' activity.   Handouts Provided Include  Non-starchy vegetable list  Learning Style & Readiness for Change Teaching method utilized: Visual & Auditory  Demonstrated degree of understanding via: Teach Back  Barriers to learning/adherence to lifestyle change: none  Goals Established by Pt  New Goal:  Aim to eat 3 meals per day. At meals aim to include protein, complex carb, and 1/2 plate of vegetables.   Continue with all previous goals:  Goal: go walking for 30 minutes at least 3 days per week. - great work! Keep it up. (Pt has been doing 3-7 days per week)  Aim to make 1/2 of your plate vegetables at least 2x/day - goal in progress, continue!  Goal: switch any sodas to diet soda. -goal met,  continue! Rethink what you drink. Choose beverages without added sugar. Look for 0 carbs on the label.   MONITORING & EVALUATION Dietary intake, weekly physical activity, and follow up in 3 months.  Next Steps  Patient is to call for questions.

## 2023-03-13 DIAGNOSIS — E119 Type 2 diabetes mellitus without complications: Secondary | ICD-10-CM | POA: Diagnosis not present

## 2023-03-28 DIAGNOSIS — M545 Low back pain, unspecified: Secondary | ICD-10-CM | POA: Diagnosis not present

## 2023-04-12 DIAGNOSIS — E119 Type 2 diabetes mellitus without complications: Secondary | ICD-10-CM | POA: Diagnosis not present

## 2023-04-26 ENCOUNTER — Telehealth: Payer: Self-pay | Admitting: Cardiology

## 2023-04-26 NOTE — Telephone Encounter (Signed)
Patent stopped at the [employer] today to request we get a message to [facility] so he can be approved as a patient of Dr. Emelda Brothers in the new [employer].  The [facility] reference case number is [id number]  The Fax number to send correspondence is [phone]   I am placing a copy of the letter from [facility] in Dr. Emelda Brothers in-box/mail box.  Thank you for your assistance

## 2023-04-27 NOTE — Telephone Encounter (Signed)
What do I need to do regarding this matter? Please let me know.   Milan Perkins [place], DO, [facility]

## 2023-05-05 DIAGNOSIS — R102 Pelvic and perineal pain: Secondary | ICD-10-CM | POA: Diagnosis not present

## 2023-05-13 DIAGNOSIS — E119 Type 2 diabetes mellitus without complications: Secondary | ICD-10-CM | POA: Diagnosis not present

## 2023-05-20 ENCOUNTER — Encounter (HOSPITAL_COMMUNITY): Payer: Self-pay | Admitting: *Deleted

## 2023-05-20 ENCOUNTER — Other Ambulatory Visit: Payer: Self-pay

## 2023-05-20 ENCOUNTER — Ambulatory Visit (HOSPITAL_COMMUNITY)
Admission: EM | Admit: 2023-05-20 | Discharge: 2023-05-20 | Disposition: A | Discharge status: Home / Self Care | Payer: Medicare Other | Attending: Emergency Medicine | Admitting: Emergency Medicine

## 2023-05-20 ENCOUNTER — Telehealth (HOSPITAL_COMMUNITY): Payer: Self-pay | Admitting: *Deleted

## 2023-05-20 DIAGNOSIS — B9689 Other specified bacterial agents as the cause of diseases classified elsewhere: Secondary | ICD-10-CM

## 2023-05-20 DIAGNOSIS — J019 Acute sinusitis, unspecified: Secondary | ICD-10-CM

## 2023-05-20 MED ORDER — AMOXICILLIN-POT CLAVULANATE 875-125 MG PO TABS: 1.0000 | ORAL_TABLET | Freq: Two times a day (BID) | ORAL | 0 refills | Status: DC

## 2023-05-20 MED ORDER — GUAIFENESIN ER 600 MG PO TB12: 1200.0000 mg | ORAL_TABLET | Freq: Two times a day (BID) | ORAL | 0 refills | Status: AC

## 2023-05-20 MED ORDER — FLUTICASONE PROPIONATE 50 MCG/ACT NA SUSP
1.0000 | Freq: Every day | NASAL | 2 refills | Status: AC
Start: 2023-05-20 — End: ?

## 2023-05-20 NOTE — Telephone Encounter (Signed)
Called pharmacy they states that the guaifenesin isnt covered by insurance since it is over the counter. I have called pt and advised he verbalized understanding and will go pick it up tomorrow

## 2023-05-20 NOTE — ED Provider Notes (Signed)
[facility]    CSN: [id number] Arrival date & time: 05/20/23  1030      History   Chief Complaint Chief Complaint  Patient presents with   Nasal Congestion    HPI Caleb [name] is a 72 y.o. male.   Patient presents to clinic for nasal congestion and increased mucus in his throat.  Reports his hearing has been diminished over the past week as well.  Reports his cough is productive with green mucus.  Denies any wheezing or shortness of breath.  He has taken over-the-counter TheraFlu and Sudafed without much relief.  Reports pressure in his maxillary sinuses.  Has not had any fevers.  He did get his flu shot a few months ago.   The history is provided by the patient and medical records.    Past Medical History:  Diagnosis Date   High cholesterol    Sleep apnea    Type II diabetes mellitus [facility])     Patient Active Problem List   Diagnosis Date Noted   Precordial pain 12/19/2022   Allergic rhinitis 08/06/2021   Benign essential hypertension 08/06/2021   Bowel incontinence 08/06/2021   Eczema 08/06/2021   Ganglion cyst 08/06/2021   Gastroesophageal reflux disease 08/06/2021   Hypercholesterolemia 08/06/2021   Iron deficiency anemia 08/06/2021   Pelvic and perineal pain 08/06/2021   Primary generalized (osteo)arthritis 08/06/2021   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) 08/06/2021   Type 2 diabetes mellitus with other specified complication (HCC) 08/06/2021   Vitamin D deficiency 08/06/2021   Sleep apnea 12/23/2020   S/P right rotator cuff repair 02/02/2018   Nontraumatic complete tear of right rotator cuff 11/15/2017   Chronic right shoulder pain 11/15/2017   Acute pain of right shoulder 10/18/2017   Chronic post-traumatic stress disorder 04/02/2014   Hearing loss 02/05/2014   Anxiety 11/08/2013   Ex-smoker 11/08/2013   History of colonic polyps 07/13/2003    Past Surgical History:  Procedure Laterality Date   LEFT HEART  CATHETERIZATION WITH CORONARY ANGIOGRAM N/A 05/04/2013   Procedure: LEFT HEART CATHETERIZATION WITH CORONARY ANGIOGRAM;  Surgeon: Pamella Pert, MD;  Location: [facility] CATH LAB;  Service: Cardiovascular;  Laterality: N/A;   NO PAST SURGERIES     ROTATOR CUFF REPAIR Right 2020       Home Medications    Prior to Admission medications   Medication Sig Start Date End Date Taking? Authorizing Provider  amoxicillin-clavulanate (AUGMENTIN) 875-125 MG tablet Take 1 tablet by mouth every 12 (twelve) hours. 05/20/23  Yes Rinaldo Ratel, [place] N, FNP  fluticasone [facility]) 50 MCG/ACT nasal spray Place 1 spray into both nostrils daily. 05/20/23  Yes Rinaldo Ratel, [place] N, FNP  glimepiride (AMARYL) 4 MG tablet Take 4 mg by mouth every morning. 05/11/22  Yes [provider]  guaiFENesin (MUCINEX) 600 MG 12 hr tablet Take 2 tablets (1,200 mg total) by mouth 2 (two) times daily for 5 days. 05/20/23 05/25/23 Yes Rinaldo Ratel, [place] N, FNP  losartan (COZAAR) 25 MG tablet Take 25 mg by mouth daily. 05/29/22  Yes [provider]  metFORMIN (GLUCOPHAGE) 1000 MG tablet TAKE ONE TABLET BY MOUTH TWICE A DAY FOR DIABETES (ANNUAL KIDNEY FUNCTION TESTING IS NEEDED) 05/05/21  Yes [provider]  rosuvastatin (CRESTOR) 10 MG tablet Take 10 mg by mouth 3 (three) times a week. MWF 09/11/20  Yes [provider]  vitamin B-12 (CYANOCOBALAMIN) 500 MCG tablet Take 1 tablet by mouth daily. 05/06/21  Yes [provider]  ferrous sulfate 324  MG TBEC Take 324 mg by mouth daily with breakfast. 5 days a week    [provider]  pantoprazole (PROTONIX) 20 MG tablet Take 1 tablet (20 mg total) by mouth daily. 07/29/22 11/29/22  Nira Conn, MD    Family History Family History  Problem Relation Age of Onset   Cancer Mother    Cancer Father    Hypertension Sister    Diabetes Sister    Diabetes Sister    Hypertension Sister    Cancer Brother    Diabetes Brother    Hypertension  Brother    Diabetes Brother     Social History Social History   Tobacco Use   Smoking status: Former    Current packs/day: 0.00    Average packs/day: 0.1 packs/day for 20.0 years (2.4 ttl pk-yrs)    Types: Cigarettes    Start date: 25    Quit date: 1980    Years since quitting: 44.8   Smokeless tobacco: Never   Tobacco comments:    05/04/2013 "quit smoking ~ 1991"  Substance Use Topics   Alcohol use: No   Drug use: No     Allergies   Atorvastatin calcium, Simvastatin, and Tramadol hcl   Review of Systems Review of Systems   Physical Exam Triage Vital Signs ED Triage Vitals  Encounter Vitals Group     BP 05/20/23 1237 125/76     Systolic BP Percentile --      Diastolic BP Percentile --      Pulse Rate 05/20/23 1237 78     Resp 05/20/23 1237 18     Temp 05/20/23 1237 98 F (36.7 C)     Temp src --      SpO2 05/20/23 1237 95 %     Weight --      Height --      Head Circumference --      Peak Flow --      Pain Score 05/20/23 1235 0     Pain Loc --      Pain Education --      Exclude from Growth Chart --    No data found.  Updated Vital Signs BP 125/76   Pulse 78   Temp 98 F (36.7 C)   Resp 18   SpO2 95%   Visual Acuity Right Eye Distance:   Left Eye Distance:   Bilateral Distance:    Right Eye Near:   Left Eye Near:    Bilateral Near:     Physical Exam Vitals and nursing note reviewed.  Constitutional:      Appearance: Normal appearance.  HENT:     Head: Normocephalic and atraumatic.     Right Ear: Tympanic membrane, ear canal and external ear normal.     Left Ear: Tympanic membrane, ear canal and external ear normal.     Nose: Congestion and rhinorrhea present.     Right Sinus: Maxillary sinus tenderness present.     Left Sinus: Maxillary sinus tenderness present.     Mouth/Throat:     Mouth: Mucous membranes are moist.     Pharynx: Posterior oropharyngeal erythema present.  Eyes:     Conjunctiva/sclera: Conjunctivae normal.   Cardiovascular:     Rate and Rhythm: Normal rate and regular rhythm.     Heart sounds: Normal heart sounds. No murmur heard. Pulmonary:     Effort: Pulmonary effort is normal. No respiratory distress.     Breath sounds: Normal breath sounds.  Musculoskeletal:  General: Normal range of motion.  Lymphadenopathy:     Cervical: Cervical adenopathy present.  Skin:    General: Skin is warm and dry.  Neurological:     General: No focal deficit present.     Mental Status: He is alert.  Psychiatric:        Mood and Affect: Mood normal.        Behavior: Behavior is cooperative.      UC Treatments / Results  Labs (all labs ordered are listed, but only abnormal results are displayed) Labs Reviewed - No data to display  EKG   Radiology No results found.  Procedures Procedures (including critical care time)  Medications Ordered in UC Medications - No data to display  Initial Impression / Assessment and Plan / UC Course  I have reviewed the triage vital signs and the nursing notes.  Pertinent labs & imaging results that were available during my care of the patient were reviewed by me and considered in my medical decision making (see chart for details).  Vitals and triage reviewed, patient is hemodynamically stable.  Presents to clinic with nasal congestion with green drainage and sinus pressure for the past 7 days.  Lungs are vesicular, heart with regular rate and rhythm.  Afebrile.  Will cover with Augmentin for bacterial sinusitis.  Symptomatic management for congestion and diminished hearing discussed.  Tympanic membrane's are pearly gray bilaterally, low concern for otitis media.  Plan of care, follow-up care return precautions given, no questions at this time.     Final Clinical Impressions(s) / UC Diagnoses   Final diagnoses:  Acute bacterial sinusitis     Discharge Instructions      Take all antibiotics as prescribed and until finished, you can take with food  to prevent gastrointestinal upset.  He can use the Flonase once daily to help with your congestion as well as taking 1200 mg of Mucinex daily.  Ensure you are staying well-hydrated with at least 64 ounces of water.  You can also consider sleeping with a humidifier at night to help with your cough.  Your symptoms should improve over the next few days, if no improvement over the next few days or your symptoms remain despite finishing antibiotics please follow-up with your primary care provider or return to clinic for reevaluation.     ED Prescriptions     Medication Sig Dispense Auth. Provider   amoxicillin-clavulanate (AUGMENTIN) 875-125 MG tablet Take 1 tablet by mouth every 12 (twelve) hours. 14 tablet Rinaldo Ratel, [place] N, FNP   fluticasone [facility]) 50 MCG/ACT nasal spray Place 1 spray into both nostrils daily. 9.9 mL Rinaldo Ratel, [place] N, FNP   guaiFENesin (MUCINEX) 600 MG 12 hr tablet Take 2 tablets (1,200 mg total) by mouth 2 (two) times daily for 5 days. 20 tablet Jailynne Opperman, [place] N, [place]      PDMP not reviewed this encounter.   Jazarah Capili, [place] N, [place] 05/20/23 1254

## 2023-05-20 NOTE — Discharge Instructions (Addendum)
Take all antibiotics as prescribed and until finished, you can take with food to prevent gastrointestinal upset.  He can use the Flonase once daily to help with your congestion as well as taking 1200 mg of Mucinex daily.  Ensure you are staying well-hydrated with at least 64 ounces of water.  You can also consider sleeping with a humidifier at night to help with your cough.  Your symptoms should improve over the next few days, if no improvement over the next few days or your symptoms remain despite finishing antibiotics please follow-up with your primary care provider or return to clinic for reevaluation.

## 2023-05-20 NOTE — ED Notes (Signed)
Pt called from cell number in chart. No answer or voicemail.

## 2023-05-20 NOTE — ED Triage Notes (Signed)
Pt reports he has increased mucous in throat and nasal congestion for one week. Pt also reports decreased hearing.

## 2023-05-25 DIAGNOSIS — E1165 Type 2 diabetes mellitus with hyperglycemia: Secondary | ICD-10-CM | POA: Diagnosis not present

## 2023-05-25 DIAGNOSIS — B9689 Other specified bacterial agents as the cause of diseases classified elsewhere: Secondary | ICD-10-CM | POA: Diagnosis not present

## 2023-05-25 DIAGNOSIS — J019 Acute sinusitis, unspecified: Secondary | ICD-10-CM | POA: Diagnosis not present

## 2023-06-15 ENCOUNTER — Ambulatory Visit: Payer: Medicare Other | Admitting: Dietician

## 2023-06-30 ENCOUNTER — Ambulatory Visit: Payer: Medicare Other | Attending: Cardiology | Admitting: Cardiology

## 2023-06-30 VITALS — BP 136/82 | HR 88 | Resp 16 | Ht 69.0 in | Wt 195.4 lb

## 2023-06-30 DIAGNOSIS — I10 Essential (primary) hypertension: Secondary | ICD-10-CM

## 2023-06-30 DIAGNOSIS — E782 Mixed hyperlipidemia: Secondary | ICD-10-CM | POA: Diagnosis not present

## 2023-06-30 DIAGNOSIS — G4730 Sleep apnea, unspecified: Secondary | ICD-10-CM

## 2023-06-30 DIAGNOSIS — E1165 Type 2 diabetes mellitus with hyperglycemia: Secondary | ICD-10-CM

## 2023-06-30 DIAGNOSIS — R931 Abnormal findings on diagnostic imaging of heart and coronary circulation: Secondary | ICD-10-CM | POA: Diagnosis not present

## 2023-06-30 DIAGNOSIS — Z87891 Personal history of nicotine dependence: Secondary | ICD-10-CM

## 2023-06-30 DIAGNOSIS — I2510 Atherosclerotic heart disease of native coronary artery without angina pectoris: Secondary | ICD-10-CM | POA: Diagnosis not present

## 2023-06-30 NOTE — Patient Instructions (Signed)
 Medication Instructions:  Your physician recommends that you continue on your current medications as directed. Please refer to the Current Medication list given to you today.  *If you need a refill on your cardiac medications before your next appointment, please call your pharmacy*  Lab Work: None ordered today. If you have labs (blood work) drawn today and your tests are completely normal, you will receive your results only by: MyChart Message (if you have MyChart) OR A paper copy in the mail If you have any lab test that is abnormal or we need to change your treatment, we will call you to review the results.  Testing/Procedures: None ordered today.  Follow-Up: At [facility], you and your health needs are our priority.  As part of our continuing mission to provide you with exceptional heart care, we have created designated Provider Care Teams.  These Care Teams include your primary Cardiologist (physician) and Advanced Practice Providers (APPs -  Physician Assistants and Nurse Practitioners) who all work together to provide you with the care you need, when you need it.  We recommend signing up for the patient portal called "MyChart".  Sign up information is provided on this After Visit Summary.  MyChart is used to connect with patients for Virtual Visits (Telemedicine).  Patients are able to view lab/test results, encounter notes, upcoming appointments, etc.  Non-urgent messages can be sent to your provider as well.   To learn more about what you can do with MyChart, go to [email or web address].    Your next appointment:   6 month(s)  The format for your next appointment:   In Person  Provider:   Tessa Lerner, DO {

## 2023-06-30 NOTE — Progress Notes (Signed)
Cardiology Office Note:  .   Date:  07/07/2023  ID:  Caleb [name], DOB May 29, 1951, MRN [id number] PCP:  Clovis Riley, L.August Saucer, MD (Inactive)  Former Cardiology Providers: N/A [facility] Providers Cardiologist:  Tessa Lerner, DO , [facility] (established care December 23, 2020) Electrophysiologist:  None  Click to update primary MD,subspecialty MD or APP then REFRESH:1}    Chief Complaint  Patient presents with   Nonobstructive atherosclerosis of coronary artery   Follow-up    History of Present Illness: .   Caleb [name] is a 72 y.o. African-American male whose past medical history and cardiovascular risk factors includes: Hypertension, Aortic atherosclerosis, hypercholesterolemia, type 2 diabetes, sleep apnea not on CPAP, family history of colon cancer, former smoker, advance age.   Patient is being followed by the practice given his history of coronary artery disease as per his last coronary CTA that was done as part of chest pain workup in the past.   Since last office visit he denies any anginal chest pain or heart failure symptoms.  No hospitalizations or urgent care visits for cardiovascular reasons.  Overall functional capacity remains relatively stable.  He walks once or twice a week as weather permitting.  He currently takes his cholesterol medications 3 times a week due to side effect profile but is willing to increase it to at least 5 days a week and see how he does.  Review of Systems: .   Review of Systems  Cardiovascular:  Negative for chest pain, claudication, dyspnea on exertion, irregular heartbeat, leg swelling, near-syncope, orthopnea, palpitations, paroxysmal nocturnal dyspnea and syncope.  Respiratory:  Negative for shortness of breath.   Hematologic/Lymphatic: Negative for bleeding problem.  Musculoskeletal:  Negative for muscle cramps and myalgias.  Neurological:  Negative for dizziness and light-headedness.    Studies Reviewed:   EKG: EKG  Interpretation Date/Time:  Thursday June 30 2023 09:07:13 EST Ventricular Rate:  88 PR Interval:  174 QRS Duration:  78 QT Interval:  328 QTC Calculation: 396 R Axis:   21  Text Interpretation: Normal sinus rhythm Cannot rule out Anterior infarct , age undetermined When compared with ECG of 16-Nov-2022 13:43, No significant change since last tracing Confirmed by Tessa Lerner [id number]) on 06/30/2023 9:25:07 AM  Echocardiogram: 12/29/2022: Normal LV systolic function with visual EF 60-65%. Left ventricle cavity is normal in size. Normal left ventricular wall thickness. Normal global wall motion. Normal diastolic filling pattern, normal LAP.  Mild pulmonic regurgitation. Compared to 12/24/2022: Mild AR/moderate MR are not appreciated on current study. Otherwise no significant change.    Stress Testing: Exercise Sestamibi Stress Test 01/19/2021: Normal ECG stress. The patient exercised for 7 minutes and 26 seconds of a Bruce protocol, achieving approximately 9.26 METs. Normal BP. Peak EKG/ECG revealed no ST-T wave abnormalities. During exercise the peak ECG revealed no arrhythmias. Exercise capacity low.  Myocardial perfusion is normal. Overall LV systolic function is normal without regional wall motion abnormalities. Stress LV EF: 67%.  No previous exam available for comparison. Low risk.    Heart Catheterization: 2014: Normal coronary arteries with left dominant circulation with normal left systolic function.   CCTA 12/15/2022 1. Total coronary calcium score of 35.3. This was 72 st percentile for age and sex matched control.  2. Normal coronary origin with left dominance.  3. CAD-RADS = 2 Mild non-obstructive.  Left Main: Patent.  LAD: Patent.  LCx: Mild stenosis (25-49%) due to calcified plaque otherwise vessel is patent.  RCA: Mild stenosis (25-49%) due to soft plaque otherwise  vessel is patent.  Noncardiac findings: No significant extracardiac incidental findings  identified.   RECOMMENDATIONS: Consider non-atherosclerotic causes of chest pain. Consider preventive therapy and risk factor modification.   RADIOLOGY: NA  Risk Assessment/Calculations:   NA   Labs:       Latest Ref Rng & Units 11/16/2022    8:20 AM 07/28/2022    2:31 PM 12/11/2020   10:45 AM  CBC  WBC 4.0 - 10.5 K/uL 4.7  6.1  6.3   Hemoglobin 13.0 - 17.0 g/dL 16.1  09.6  04.5   Hematocrit 39.0 - 52.0 % 40.6  42.3  41.9   Platelets 150 - 400 K/uL 282  311  312        Latest Ref Rng & Units 11/16/2022    8:20 AM 07/28/2022    2:31 PM 12/23/2020    2:36 PM  BMP  Glucose 70 - 99 mg/dL 409  811  914   BUN 8 - 23 mg/dL 10  11  8    Creatinine 0.61 - 1.24 mg/dL 7.82  9.56  2.13   BUN/Creat Ratio 10 - 24   10   Sodium 135 - 145 mmol/L 132  137  140   Potassium 3.5 - 5.1 mmol/L 4.7  4.3  4.7   Chloride 98 - 111 mmol/L 98  105  102   CO2 22 - 32 mmol/L 26  24  22    Calcium 8.9 - 10.3 mg/dL 9.3  9.3  9.9       Latest Ref Rng & Units 11/16/2022    8:20 AM 07/28/2022    2:31 PM 12/23/2020    2:36 PM  CMP  Glucose 70 - 99 mg/dL 086  578  469   BUN 8 - 23 mg/dL 10  11  8    Creatinine 0.61 - 1.24 mg/dL 6.29  5.28  4.13   Sodium 135 - 145 mmol/L 132  137  140   Potassium 3.5 - 5.1 mmol/L 4.7  4.3  4.7   Chloride 98 - 111 mmol/L 98  105  102   CO2 22 - 32 mmol/L 26  24  22    Calcium 8.9 - 10.3 mg/dL 9.3  9.3  9.9   Total Protein 6.5 - 8.1 g/dL 6.8  7.3    Total Bilirubin 0.3 - 1.2 mg/dL 0.6  0.7    Alkaline Phos 38 - 126 U/L 41  42    AST 15 - 41 U/L 20  24    ALT 0 - 44 U/L 25  18     External Labs: Collected: 07/02/2020 Creatinine 0.83 mg/dL. eGFR: >60 mL/min per 1.73 m Potassium level: 4 Lipid profile: Total cholesterol 160, triglycerides 98, HDL 41, LDL 101, non-HDL 119 Hemoglobin A1c: 7.3   External Labs: Collected: November 2023 available in Care Everywhere from [place] clinic Total cholesterol 130, triglycerides 176, HDL 37, LDL calculated 58   Physical Exam:     Today's Vitals   06/30/23 0904 06/30/23 0922  BP: (!) 158/74 136/82  Pulse: 91 88  Resp: 16   SpO2: 98%   Weight: 195 lb 6.4 oz (88.6 kg)   Height: 5\' 9"  (1.753 m)    Body mass index is 28.86 kg/m. Wt Readings from Last 3 Encounters:  06/30/23 195 lb 6.4 oz (88.6 kg)  01/26/23 192 lb 12.8 oz (87.5 kg)  12/08/22 197 lb (89.4 kg)    Physical Exam  Constitutional: No distress.  Age appropriate, hemodynamically stable.  Neck: No JVD present.  Cardiovascular: Normal rate, regular rhythm, S1 normal, S2 normal, intact distal pulses and normal pulses. Exam reveals no gallop, no S3 and no S4.  No murmur heard. Pulmonary/Chest: Effort normal and breath sounds normal. No stridor. He has no wheezes. He has no rales.  Abdominal: Soft. Bowel sounds are normal. He exhibits no distension. There is no abdominal tenderness.  Musculoskeletal:        General: No edema.     Cervical back: Neck supple.  Neurological: He is alert and oriented to person, place, and time. He has intact cranial nerves (2-12).  Skin: Skin is warm and moist.   Impression & Recommendation(s):  Impression:   ICD-10-CM   1. Nonobstructive atherosclerosis of coronary artery  I25.10 EKG 12-Lead    2. Agatston coronary artery calcium score less than 100  R93.1     3. Benign hypertension  I10     4. Mixed hyperlipidemia  E78.2     5. Type 2 diabetes mellitus with hyperglycemia, without long-term current use of insulin (HCC)  E11.65     6. Sleep apnea in adult  G47.30     7. Former smoker  Z87.891        Recommendation(s):  Nonobstructive atherosclerosis of coronary artery Agatston coronary artery calcium score less than 100 No chest pain since last visit Not on ASA due to mild CAC and his percentile is <75th Taking Crestor three times a week - recommended daily if able to tolerate.  Has undergone appropriate cardiovascular workup in the past. No additional testing warranted at this time. Reemphasized the  importance of secondary prevention with focus on improving her modifiable cardiovascular risk factors such as glycemic control, lipid management, blood pressure control.  Benign hypertension Initial blood pressures were not well-controlled. Repeat readings did improve. Currently on losartan 25 mg p.o. daily. Monitor blood pressures at home to see the ambulatory trend.  He will discuss further with PCP. Reemphasized importance of low-salt diet.  Mixed hyperlipidemia Currently on Crestor 20 mg Monday Wednesday Friday.   He denies myalgia or other side effects. No recent lipids available in Care Everywhere via [place].   He will send [place] a copy when available.  Type 2 diabetes mellitus with hyperglycemia, without long-term current use of insulin (HCC) Reemphasized importance of glycemic control-managed by PCP at the [place]. Currently on ARB, statin therapy   Orders Placed:  Orders Placed This Encounter  Procedures   EKG 12-Lead    Final Medication List:   No orders of the defined types were placed in this encounter.   Medications Discontinued During This Encounter  Medication Reason   pantoprazole (PROTONIX) 20 MG tablet Patient Preference   amoxicillin-clavulanate (AUGMENTIN) 875-125 MG tablet Completed Course     Current Outpatient Medications:    ferrous sulfate 324 MG TBEC, Take 324 mg by mouth daily with breakfast. 5 days a week, Disp: , Rfl:    fluticasone (FLONASE) 50 MCG/ACT nasal spray, Place 1 spray into both nostrils daily. (Patient taking differently: Place 1 spray into both nostrils as needed.), Disp: 9.9 mL, Rfl: 2   glimepiride (AMARYL) 4 MG tablet, Take 4 mg by mouth every morning., Disp: , Rfl:    losartan (COZAAR) 25 MG tablet, Take 25 mg by mouth daily., Disp: , Rfl:    metFORMIN (GLUCOPHAGE) 1000 MG tablet, TAKE ONE TABLET BY MOUTH TWICE A DAY FOR DIABETES (ANNUAL KIDNEY FUNCTION TESTING IS NEEDED), Disp: , Rfl:    rosuvastatin (CRESTOR) 10  MG tablet, Take 10 mg by  mouth 3 (three) times a week. MWF, Disp: , Rfl:    vitamin B-12 (CYANOCOBALAMIN) 500 MCG tablet, Take 1 tablet by mouth daily., Disp: , Rfl:   Consent:   NA  Disposition:   6 months sooner if needed.  Patient may be asked to follow-up sooner based on the results of the above-mentioned testing.  His questions and concerns were addressed to his satisfaction. He voices understanding of the recommendations provided during this encounter.    Signed, Tessa Lerner, DO, [facility]  [facility]  [address] [place], [place] [address]

## 2023-07-07 ENCOUNTER — Encounter: Payer: Self-pay | Admitting: Cardiology

## 2023-07-29 ENCOUNTER — Encounter: Payer: Self-pay | Admitting: Dietician

## 2023-07-29 ENCOUNTER — Encounter: Payer: Medicare Other | Attending: Infectious Diseases | Admitting: Dietician

## 2023-07-29 VITALS — Wt 195.0 lb

## 2023-07-29 DIAGNOSIS — E1169 Type 2 diabetes mellitus with other specified complication: Secondary | ICD-10-CM | POA: Insufficient documentation

## 2023-07-29 NOTE — Progress Notes (Signed)
Medical Nutrition Therapy  Appointment Start time:  1115  Appointment End time:  1145   Referral diagnosis: primary hypertension Preferred learning style: no preference indicated Learning readiness: ready   NUTRITION ASSESSMENT   Anthropometrics  Ht: 69 in  Wt Readings from Last 3 Encounters:  07/29/23 195 lb (88.5 kg)  06/30/23 195 lb 6.4 oz (88.6 kg)  01/26/23 192 lb 12.8 oz (87.5 kg)   Clinical Medical Hx: type 2 diabetes, sleep apnea, HLD, HTN, sleep apnea Medications: metformin, glimepiride Labs: 11/30/22 A1c 8.8%,  Notable Signs/Symptoms: none reported Food Allergies: none reported  Lifestyle & Dietary Hx  Pt a1c down from 8.8% at time of referral, to 8.3%, to most recently 7.3%. Pt reports he wants to continue to work toward an A1c of below 7.0%.  Pt states he has been trying to be more consistent with eating habits. Pt states he understands he is the one to apply what he learns here, and continues to think through what he has learned.   Pt reports he has not been walking outdoors due to the cold weather, so his exercise has slowed down. Pt states he has stairs inside that he walks up a few times a day, but he plans to start doing some home exercising and walking indoors for 15 minutes if he is not going to walk outdoors.   Pt states he has been fasting for church for the last 1-2 weeks. They have been doing a fast from midnight-4pm the next day, so he has been eating 1-2 times per day since then, but the fast ends tonight. He reports he wants to continue to do this 1x/wk for personal reasons.   Pt states he got a blood glucose meter and has been checking 1-2 times per day.   Pt states he is at a 36 waist size and wants to be at 34.    Estimated daily fluid intake: 32-64 oz Supplements: iron, vitamin b12, MVI, vitamin D Sleep: 7pm-4am Stress / self-care: moderate stress Current average weekly physical activity: occasional walking, has been inconsistent since the  cold weather  24-Hr Dietary Recall First Meal: 8am: banana and honey nut cheerios Snack: none Second Meal: 2pm (normally): eggs and cheese and whole wheat bread (hasn't been having lunch due to fasting for church) OR [employer] vegetable plate Snack: none Third Meal: eggs and toast OR oatmeal Snack: none Beverages: coffee, water   NUTRITION DIAGNOSIS  NB-1.1 Food and nutrition-related knowledge deficit As related to lack of prior nutrition education by a nutrition professional.  As evidenced by pt report.   NUTRITION INTERVENTION  Nutrition education (E-1) on the following topics:   Managing Type 2 diabetes through nutrition involves several key strategies: -Understanding Carbohydrates: Recognize carbohydrate-rich foods, control portions, and choose low-glycemic index options. -Balanced Diet: Incorporate proteins, healthy fats, and fiber from diverse food sources. -Meal Planning: Eat at regular intervals, control portions, and choose healthy snacks. -Reducing Sugar Intake: Limit sugary foods and drinks, including those with hidden sugars. -Hydration: Stay hydrated with water and non-caloric beverages. -Consistent Monitoring: Regularly monitor blood sugar levels and adjust diet accordingly. -Professional Guidance: Seek advice from dietitians and diabetes educators for personalized meal plans. -Combining these nutritional strategies with regular physical activity can help effectively manage Type 2 diabetes and improve overall health. -Insulin resistance and A1c goals. Explained how insulin resistance works and pathophysiology of type 2 diabetes. -Walked pt through and interactive 'build a grocery list' activity.   Blood glucose goals:  80-130 fasting   <  180 two hours after starting any meal  Generally a rise of 40-60 points after a meal is normal  A1C Goal:   Less than 7%.   Exercise Finding an exercise you enjoy is crucial for maintaining long-term fitness and overall health.  Enjoyable activities are more likely to become regular habits, making it easier to stay consistent with physical activity. When you look forward to your workouts, exercise becomes a positive experience rather than a chore, reducing the likelihood of burnout or quitting. Enjoyable exercise also enhances mental well-being, as engaging in activities you love can boost mood, reduce stress, and provide a sense of accomplishment.   Handouts Provided Include (previous assessments) Non-starchy vegetable list Build a grocery list Plate Method Meal Ideas Snack Ideas  Learning Style & Readiness for Change Teaching method utilized: Visual & Auditory  Demonstrated degree of understanding via: Teach Back  Barriers to learning/adherence to lifestyle change: none  Goals Established by Pt  New Goals:  Goal: drink at least 3-4 water bottles over the day (total 48-64 oz).  Goal: get back into your walking, if it's too cold, find something to do indoors (youtube beginner home workouts, use the stairs, etc)  Continue with all previous goals:   Goal: Aim to eat 3 meals per day. At meals aim to include protein, complex carb, and 1/2 plate of vegetables. - goal not met due to fasting, continue  Goal: go walking for 30 minutes at least 3 days per week. - great work! Keep it up. (Pt has been doing 3-7 days per week)  Aim to make 1/2 of your plate vegetables at least 2x/day - goal in progress, continue!  Goal: switch any sodas to diet soda. -goal met, continue!   MONITORING & EVALUATION Dietary intake, weekly physical activity, and follow up in 6 months.  Next Steps  Patient is to call for questions.

## 2023-08-17 DIAGNOSIS — M79651 Pain in right thigh: Secondary | ICD-10-CM | POA: Diagnosis not present

## 2023-08-17 DIAGNOSIS — M545 Low back pain, unspecified: Secondary | ICD-10-CM | POA: Diagnosis not present

## 2023-08-24 DIAGNOSIS — M5441 Lumbago with sciatica, right side: Secondary | ICD-10-CM | POA: Diagnosis not present

## 2023-09-15 DIAGNOSIS — E1169 Type 2 diabetes mellitus with other specified complication: Secondary | ICD-10-CM | POA: Diagnosis not present

## 2023-09-15 DIAGNOSIS — I10 Essential (primary) hypertension: Secondary | ICD-10-CM | POA: Diagnosis not present

## 2023-09-15 DIAGNOSIS — E7800 Pure hypercholesterolemia, unspecified: Secondary | ICD-10-CM | POA: Diagnosis not present

## 2023-09-15 NOTE — Therapy (Signed)
 OUTPATIENT PHYSICAL THERAPY THORACOLUMBAR EVALUATION  Patient Name: Caleb [name] MRN: [id number] DOB:Apr 16, 1951, 73 y.o., male Today's Date: 09/16/2023   PT End of Session - 09/16/23 1110     Visit Number 1    Number of Visits --   1-2x/week   Date for PT Re-Evaluation 11/11/23    Authorization Type [employer] - ODI    PT Start Time 1036    PT Stop Time 1120    PT Time Calculation (min) 44 min             Past Medical History:  Diagnosis Date   High cholesterol    Sleep apnea    Type II diabetes mellitus (HCC)    Past Surgical History:  Procedure Laterality Date   LEFT HEART CATHETERIZATION WITH CORONARY ANGIOGRAM N/A 05/04/2013   Procedure: LEFT HEART CATHETERIZATION WITH CORONARY ANGIOGRAM;  Surgeon: Pamella Pert, MD;  Location: [facility] CATH LAB;  Service: Cardiovascular;  Laterality: N/A;   NO PAST SURGERIES     ROTATOR CUFF REPAIR Right 2020   Patient Active Problem List   Diagnosis Date Noted   Precordial pain 12/19/2022   Allergic rhinitis 08/06/2021   Benign essential hypertension 08/06/2021   Bowel incontinence 08/06/2021   Eczema 08/06/2021   Ganglion cyst 08/06/2021   Gastroesophageal reflux disease 08/06/2021   Hypercholesterolemia 08/06/2021   Iron deficiency anemia 08/06/2021   Pelvic and perineal pain 08/06/2021   Primary generalized (osteo)arthritis 08/06/2021   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) 08/06/2021   Type 2 diabetes mellitus with other specified complication (HCC) 08/06/2021   Vitamin D deficiency 08/06/2021   Sleep apnea 12/23/2020   S/P right rotator cuff repair 02/02/2018   Nontraumatic complete tear of right rotator cuff 11/15/2017   Chronic right shoulder pain 11/15/2017   Acute pain of right shoulder 10/18/2017   Chronic post-traumatic stress disorder 04/02/2014   Hearing loss 02/05/2014   Anxiety 11/08/2013   Ex-smoker 11/08/2013   History of colonic polyps 07/13/2003    PCP: Clovis Riley, L.August Saucer,  MD (Inactive)  REFERRING PROVIDER: Hurman Horn, MD  THERAPY DIAG:  Other low back pain - Plan: PT plan of care cert/re-cert  Muscle weakness - Plan: PT plan of care cert/re-cert  REFERRING DIAG: Lumbago with sciatica, right side [M54.41]   Rationale for Evaluation and Treatment:  Rehabilitation  SUBJECTIVE:  PERTINENT PAST HISTORY:  R shoulder pain, anxiety, HTN, DM II, osteoarthritis        PRECAUTIONS: None  WEIGHT BEARING RESTRICTIONS No  FALLS:  Has patient fallen in last 6 months? No, Number of falls: 0  MOI/History of condition:  Onset date: ~January 2025  SUBJECTIVE STATEMENT  Caleb [name] is a 73 y.o. male who presents to clinic with chief complaint of low back pain.  This started with no clear cause but has slowly gotten worse since January.  He denies radicular pain.  He denies any weakness in his LE's.  Denies any unexplained weight loss.    Red flags:  denies   Pain:  Are you having pain? Yes Pain location: low back NPRS scale:  3/10 to 8/10 Aggravating factors: standing on one place, standing after sitting Relieving factors: rest Pain description: sharp and aching Stage: Chronic 24 hour pattern: not worse on morning   Occupation: works in Development worker, community several days a week  Assistive Device: NA  Hand Dominance: NA  Patient Goals/Specific Activities: reduce pain   OBJECTIVE:   DIAGNOSTIC FINDINGS:  X-ray recently taken (not in EPIC), pt unable  to recall result  GENERAL OBSERVATION/GAIT: Significant R hip drop during L stance  SENSATION: Light touch: Appears intact  LUMBAR AROM  AROM AROM  (Eval)  Flexion Fingertips to toes  Extension limited by 50%, w/ concordant pain  Right lateral flexion WNL  Left lateral flexion WNL  Right rotation limited by 25%, w/ concordant pain  Left rotation limited by 25%, w/ concordant pain    (Blank rows = not tested)   LE MMT:  MMT Right (Eval) Left (Eval)  Hip flexion (L2, L3) 4 4   Knee extension (L3) 4+ 4+  Knee flexion 4+ 4+  Hip abduction 4* 4+  Hip extension    Hip external rotation 4* 4*  Hip internal rotation 4* 4*  Hip adduction    Ankle dorsiflexion (L4) 4+ 4+  Ankle plantarflexion (S1)    Ankle inversion    Ankle eversion    Great Toe ext (L5)    Grossly     (Blank rows = not tested, score listed is out of 5 possible points.  N = WNL, D = diminished, C = clear for gross weakness with myotome testing, * = concordant pain with testing)  SPECIAL TESTS:  Straight leg raise: L (-), R (-) Slump: L (-), R (-)  MUSCLE LENGTH: Hamstrings: Right significant restriction; Left no restriction Caleb [name] test: Right significant restriction; Left significant restriction Ely's test: Right subtle restriction; Left subtle restriction  LE ROM:  ROM Right (Eval) Left (Eval)  Hip flexion    Hip extension    Hip abduction    Hip adduction    Hip internal rotation    Hip external rotation Limited, painful Limited, painful  Knee flexion    Knee extension    Ankle dorsiflexion    Ankle plantarflexion    Ankle inversion    Ankle eversion      (Blank rows = not tested, N = WNL, * = concordant pain with testing)  Functional Tests  Eval                                                                PALPATION:   TTP bil SI, lower lumbar spine, and lumbar paraspinals  PATIENT SURVEYS:  ODI: 5/50   TODAY'S TREATMENT  Therapeutic Exercise: Creating, reviewing, and completing below HEP  PATIENT EDUCATION:  POC, diagnosis, prognosis, HEP, and outcome measures.  Pt educated via explanation, demonstration, and handout (HEP).  Pt confirms understanding verbally.   HOME EXERCISE PROGRAM: Access Code: [email or web address] URL: [email or web address] Date: 09/16/2023 Prepared by: Alphonzo Severance  Exercises - Supine Lower Trunk Rotation  - 1 x daily - 7 x weekly - 1 sets - 20 reps - 3 hold - Supine Hip Adduction Isometric with Ball  - 1 x  daily - 7 x weekly - 2 sets - 10 reps - 10'' hold - Hooklying Isometric Clamshell  - 1 x daily - 7 x weekly - 3 sets - 10 reps  Treatment priorities   Eval        Bil hip flexor, R HS length        Core and hip strength        R hip drop        Ext intolerant  ASSESSMENT:  CLINICAL IMPRESSION: Caleb [name] is a 73 y.o. male who presents to clinic with signs and sxs consistent with low back pain which has been slowly increasing in intensity since early 2025.  Appears mechanical in nature with no signs of radiculopathy on exam.  Kentrel will benefit from skilled therapy to improve his pain and ability to complete daily tasks with increased comfort.  OBJECTIVE IMPAIRMENTS: Pain, lumbar ROM, hip and core strength  ACTIVITY LIMITATIONS: bending, lifting, standing, working  PERSONAL FACTORS: See medical history and pertinent history   REHAB POTENTIAL: Good  CLINICAL DECISION MAKING: Stable/uncomplicated  EVALUATION COMPLEXITY: Low   GOALS:   SHORT TERM GOALS: Target date: 10/14/2023  Clarence will be >75% HEP compliant to improve carryover between sessions and facilitate independent management of condition  Evaluation: ongoing Goal status: INITIAL   LONG TERM GOALS: Target date: 11/11/2023   Quentez will self report >/= 50% decrease in pain from evaluation to improve function in daily tasks  Evaluation/Baseline: 8/10 max pain Goal status: INITIAL   2.  Harun will show a >/= 3 pt improvement in their ODI score (MCID is 6% or 3/50 pts) as a proxy for functional improvement   Evaluation/Baseline: 5/50 pts Goal status: INITIAL   3.  Corbet will improve the following MMTs to >/= 4+/5 with minimal pain to show improvement in strength:     Evaluation/Baseline:  LE MMT:  MMT Right (Eval) Left (Eval)  Hip flexion (L2, L3) 4 4  Knee extension (L3) 4+ 4+  Knee flexion 4+ 4+  Hip abduction 4* 4+  Hip extension    Hip external rotation 4* 4*  Hip  internal rotation 4* 4*  Hip adduction    Ankle dorsiflexion (L4) 4+ 4+  Ankle plantarflexion (S1)    Ankle inversion    Ankle eversion    Great Toe ext (L5)    Grossly     (Blank rows = not tested, score listed is out of 5 possible points.  N = WNL, D = diminished, C = clear for gross weakness with myotome testing, * = concordant pain with testing)  Goal status: INITIAL   4.  Adriano will be able to stand for at work, not limited by pain   Evaluation/Baseline: 8/10 pain Goal status: INITIAL    PLAN: PT FREQUENCY: 1-2x/week  PT DURATION: 8 weeks  PLANNED INTERVENTIONS:  97164- PT Re-evaluation, 97110-Therapeutic exercises, 97530- Therapeutic activity, [id number]- Neuromuscular re-education, 97535- Self Care, 40981- Manual therapy, [id number]- Gait training, [id number]- Aquatic Therapy, [phone]- Electrical stimulation (manual), [id number]- Vasopneumatic device, [id number]- Traction (mechanical), [id number]- Ionotophoresis 4mg /ml Dexamethasone, Taping, Dry Needling, Joint manipulation, and Spinal manipulation.   Alphonzo Severance PT, DPT 09/16/2023, 11:29 AM

## 2023-09-16 ENCOUNTER — Ambulatory Visit: Payer: TRICARE For Life (TFL) | Attending: Sports Medicine | Admitting: Physical Therapy

## 2023-09-16 ENCOUNTER — Encounter: Payer: Self-pay | Admitting: Physical Therapy

## 2023-09-16 ENCOUNTER — Other Ambulatory Visit: Payer: Self-pay

## 2023-09-16 DIAGNOSIS — M5459 Other low back pain: Secondary | ICD-10-CM | POA: Insufficient documentation

## 2023-09-16 DIAGNOSIS — M6281 Muscle weakness (generalized): Secondary | ICD-10-CM | POA: Insufficient documentation

## 2023-09-20 ENCOUNTER — Ambulatory Visit: Payer: Self-pay

## 2023-09-20 ENCOUNTER — Ambulatory Visit

## 2023-09-20 DIAGNOSIS — E1169 Type 2 diabetes mellitus with other specified complication: Secondary | ICD-10-CM | POA: Diagnosis not present

## 2023-09-20 DIAGNOSIS — E1165 Type 2 diabetes mellitus with hyperglycemia: Secondary | ICD-10-CM | POA: Diagnosis not present

## 2023-09-20 DIAGNOSIS — M5459 Other low back pain: Secondary | ICD-10-CM

## 2023-09-20 DIAGNOSIS — R159 Full incontinence of feces: Secondary | ICD-10-CM | POA: Diagnosis not present

## 2023-09-20 DIAGNOSIS — E559 Vitamin D deficiency, unspecified: Secondary | ICD-10-CM | POA: Diagnosis not present

## 2023-09-20 DIAGNOSIS — M5441 Lumbago with sciatica, right side: Secondary | ICD-10-CM | POA: Diagnosis not present

## 2023-09-20 DIAGNOSIS — E7800 Pure hypercholesterolemia, unspecified: Secondary | ICD-10-CM | POA: Diagnosis not present

## 2023-09-20 DIAGNOSIS — K219 Gastro-esophageal reflux disease without esophagitis: Secondary | ICD-10-CM | POA: Diagnosis not present

## 2023-09-20 DIAGNOSIS — D508 Other iron deficiency anemias: Secondary | ICD-10-CM | POA: Diagnosis not present

## 2023-09-20 DIAGNOSIS — M150 Primary generalized (osteo)arthritis: Secondary | ICD-10-CM | POA: Diagnosis not present

## 2023-09-20 DIAGNOSIS — J019 Acute sinusitis, unspecified: Secondary | ICD-10-CM | POA: Diagnosis not present

## 2023-09-20 DIAGNOSIS — Z0000 Encounter for general adult medical examination without abnormal findings: Secondary | ICD-10-CM | POA: Diagnosis not present

## 2023-09-20 DIAGNOSIS — I10 Essential (primary) hypertension: Secondary | ICD-10-CM | POA: Diagnosis not present

## 2023-09-20 DIAGNOSIS — M6281 Muscle weakness (generalized): Secondary | ICD-10-CM

## 2023-09-20 NOTE — Therapy (Signed)
 OUTPATIENT PHYSICAL THERAPY TREATMENT NOTE  Patient Name: Caleb [name] MRN: [id number] DOB:October 06, 1950, 73 y.o., male Today's Date: 09/20/2023   PT End of Session - 09/20/23 1607     Visit Number 2    Date for PT Re-Evaluation 11/11/23    Authorization Type [employer] - ODI    PT Start Time 1615    PT Stop Time 1653    PT Time Calculation (min) 38 min    Activity Tolerance Patient tolerated treatment well    Behavior During Therapy WFL for tasks assessed/performed             Past Medical History:  Diagnosis Date   High cholesterol    Sleep apnea    Type II diabetes mellitus (HCC)    Past Surgical History:  Procedure Laterality Date   LEFT HEART CATHETERIZATION WITH CORONARY ANGIOGRAM N/A 05/04/2013   Procedure: LEFT HEART CATHETERIZATION WITH CORONARY ANGIOGRAM;  Surgeon: Caleb Pert, MD;  Location: [facility] CATH LAB;  Service: Cardiovascular;  Laterality: N/A;   NO PAST SURGERIES     ROTATOR CUFF REPAIR Right 2020   Patient Active Problem List   Diagnosis Date Noted   Precordial pain 12/19/2022   Allergic rhinitis 08/06/2021   Benign essential hypertension 08/06/2021   Bowel incontinence 08/06/2021   Eczema 08/06/2021   Ganglion cyst 08/06/2021   Gastroesophageal reflux disease 08/06/2021   Hypercholesterolemia 08/06/2021   Iron deficiency anemia 08/06/2021   Pelvic and perineal pain 08/06/2021   Primary generalized (osteo)arthritis 08/06/2021   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) 08/06/2021   Type 2 diabetes mellitus with other specified complication (HCC) 08/06/2021   Vitamin D deficiency 08/06/2021   Sleep apnea 12/23/2020   S/P right rotator cuff repair 02/02/2018   Nontraumatic complete tear of right rotator cuff 11/15/2017   Chronic right shoulder pain 11/15/2017   Acute pain of right shoulder 10/18/2017   Chronic post-traumatic stress disorder 04/02/2014   Hearing loss 02/05/2014   Anxiety 11/08/2013   Ex-smoker  11/08/2013   History of colonic polyps 07/13/2003    PCP: Clovis Riley, L.August Saucer, MD (Inactive)  REFERRING PROVIDER: Hurman Horn, MD  THERAPY DIAG:  Other low back pain  Muscle weakness  REFERRING DIAG: Lumbago with sciatica, right side [M54.41]   Rationale for Evaluation and Treatment:  Rehabilitation  SUBJECTIVE:  PERTINENT PAST HISTORY:  R shoulder pain, anxiety, HTN, DM II, osteoarthritis        PRECAUTIONS: None  WEIGHT BEARING RESTRICTIONS No  FALLS:  Has patient fallen in last 6 months? No, Number of falls: 0  MOI/History of condition:  Onset date: ~January 2025  SUBJECTIVE STATEMENT Patient reports minimal pain currently and that he didn't have any pain while at work today.   EVAL: Caleb [name] is a 73 y.o. male who presents to clinic with chief complaint of low back pain.  This started with no clear cause but has slowly gotten worse since January.  He denies radicular pain.  He denies any weakness in his LE's.  Denies any unexplained weight loss.    Red flags:  denies   Pain:  Are you having pain? Yes Pain location: low back NPRS scale:  3/10 to 8/10 Aggravating factors: standing on one place, standing after sitting Relieving factors: rest Pain description: sharp and aching Stage: Chronic 24 hour pattern: not worse on morning   Occupation: works in Development worker, community several days a week  Assistive Device: NA  Hand Dominance: NA  Patient Goals/Specific Activities: reduce pain  OBJECTIVE:   DIAGNOSTIC FINDINGS:  X-ray recently taken (not in EPIC), pt unable to recall result  GENERAL OBSERVATION/GAIT: Significant R hip drop during L stance  SENSATION: Light touch: Appears intact  LUMBAR AROM  AROM AROM  (Eval)  Flexion Fingertips to toes  Extension limited by 50%, w/ concordant pain  Right lateral flexion WNL  Left lateral flexion WNL  Right rotation limited by 25%, w/ concordant pain  Left rotation limited by 25%, w/ concordant pain     (Blank rows = not tested)   LE MMT:  MMT Right (Eval) Left (Eval)  Hip flexion (L2, L3) 4 4  Knee extension (L3) 4+ 4+  Knee flexion 4+ 4+  Hip abduction 4* 4+  Hip extension    Hip external rotation 4* 4*  Hip internal rotation 4* 4*  Hip adduction    Ankle dorsiflexion (L4) 4+ 4+  Ankle plantarflexion (S1)    Ankle inversion    Ankle eversion    Great Toe ext (L5)    Grossly     (Blank rows = not tested, score listed is out of 5 possible points.  N = WNL, D = diminished, C = clear for gross weakness with myotome testing, * = concordant pain with testing)  SPECIAL TESTS:  Straight leg raise: L (-), R (-) Slump: L (-), R (-)  MUSCLE LENGTH: Hamstrings: Right significant restriction; Left no restriction [name] Fus test: Right significant restriction; Left significant restriction Ely's test: Right subtle restriction; Left subtle restriction  LE ROM:  ROM Right (Eval) Left (Eval)  Hip flexion    Hip extension    Hip abduction    Hip adduction    Hip internal rotation    Hip external rotation Limited, painful Limited, painful  Knee flexion    Knee extension    Ankle dorsiflexion    Ankle plantarflexion    Ankle inversion    Ankle eversion      (Blank rows = not tested, N = WNL, * = concordant pain with testing)  Functional Tests  Eval                                                                PALPATION:   TTP bil SI, lower lumbar spine, and lumbar paraspinals  PATIENT SURVEYS:  ODI: 5/50   TODAY'S TREATMENT  Therapeutic Exercise: Creating, reviewing, and completing below HEP  PATIENT EDUCATION:  POC, diagnosis, prognosis, HEP, and outcome measures.  Pt educated via explanation, demonstration, and handout (HEP).  Pt confirms understanding verbally.   HOME EXERCISE PROGRAM: Access Code: [email or web address] URL: [email or web address] Date: 09/16/2023 Prepared by: Alphonzo Severance  Exercises - Supine Lower Trunk Rotation  -  1 x daily - 7 x weekly - 1 sets - 20 reps - 3 hold - Supine Hip Adduction Isometric with Ball  - 1 x daily - 7 x weekly - 2 sets - 10 reps - 10'' hold - Hooklying Isometric Clamshell  - 1 x daily - 7 x weekly - 3 sets - 10 reps  Treatment priorities   Eval        Bil hip flexor, R HS length        Core and hip strength        R hip drop  Ext intolerant                  [facility] Adult PT Treatment:                                                DATE: 09/20/23 Therapeutic Exercise: Nustep level 5 x 5 mins Seated marching 2x30" Seated hamstring stretch 2x30" BIL Bridges 2x10 LTR x10 in abd x10 DKTC heels on pball 2x10 SKTC 2x30" BIL Sidelying clamshell 2x10 BIL Sidelying open books x10 BIL STS no UE x10   ASSESSMENT:  CLINICAL IMPRESSION: Patient presents to first follow up PT session reporting minimal pain in his lower back today and that he has been compliant with his HEP. Session today focused on mat based proximal hip and core strengthening as well as lumbar mobility. Patient was able to tolerate all prescribed exercises with no adverse effects. Patient continues to benefit from skilled PT services and should be progressed as able to improve functional independence.   EVAL: Swayze is a 73 y.o. male who presents to clinic with signs and sxs consistent with low back pain which has been slowly increasing in intensity since early 2025.  Appears mechanical in nature with no signs of radiculopathy on exam.  Albeiro will benefit from skilled therapy to improve his pain and ability to complete daily tasks with increased comfort.  OBJECTIVE IMPAIRMENTS: Pain, lumbar ROM, hip and core strength  ACTIVITY LIMITATIONS: bending, lifting, standing, working  PERSONAL FACTORS: See medical history and pertinent history   REHAB POTENTIAL: Good  CLINICAL DECISION MAKING: Stable/uncomplicated  EVALUATION COMPLEXITY: Low   GOALS:   SHORT TERM GOALS: Target date: 10/14/2023  Franco  will be >75% HEP compliant to improve carryover between sessions and facilitate independent management of condition  Evaluation: ongoing Goal status: INITIAL   LONG TERM GOALS: Target date: 11/11/2023   Torris will self report >/= 50% decrease in pain from evaluation to improve function in daily tasks  Evaluation/Baseline: 8/10 max pain Goal status: INITIAL   2.  Elishua will show a >/= 3 pt improvement in their ODI score (MCID is 6% or 3/50 pts) as a proxy for functional improvement   Evaluation/Baseline: 5/50 pts Goal status: INITIAL   3.  Zhi will improve the following MMTs to >/= 4+/5 with minimal pain to show improvement in strength:     Evaluation/Baseline:  LE MMT:  MMT Right (Eval) Left (Eval)  Hip flexion (L2, L3) 4 4  Knee extension (L3) 4+ 4+  Knee flexion 4+ 4+  Hip abduction 4* 4+  Hip extension    Hip external rotation 4* 4*  Hip internal rotation 4* 4*  Hip adduction    Ankle dorsiflexion (L4) 4+ 4+  Ankle plantarflexion (S1)    Ankle inversion    Ankle eversion    Great Toe ext (L5)    Grossly     (Blank rows = not tested, score listed is out of 5 possible points.  N = WNL, D = diminished, C = clear for gross weakness with myotome testing, * = concordant pain with testing)  Goal status: INITIAL   4.  Demontre will be able to stand for at work, not limited by pain   Evaluation/Baseline: 8/10 pain Goal status: INITIAL    PLAN: PT FREQUENCY: 1-2x/week  PT DURATION: 8 weeks  PLANNED INTERVENTIONS:  09811-  PT Re-evaluation, 97110-Therapeutic exercises, 97530- Therapeutic activity, [id number]- Neuromuscular re-education, [id number]- Self Care, 16109- Manual therapy, [id number]- Gait training, [id number]- Aquatic Therapy, [phone]- Electrical stimulation (manual), [id number]- Vasopneumatic device, [id number]- Traction (mechanical), [id number]- Ionotophoresis 4mg /ml Dexamethasone, Taping, Dry Needling, Joint manipulation, and Spinal manipulation.   Berta Minor PTA   09/20/2023, 4:54 PM

## 2023-09-21 ENCOUNTER — Ambulatory Visit

## 2023-09-28 ENCOUNTER — Ambulatory Visit: Payer: Self-pay | Admitting: Physical Therapy

## 2023-09-28 ENCOUNTER — Encounter: Payer: Self-pay | Admitting: Physical Therapy

## 2023-09-28 DIAGNOSIS — M6281 Muscle weakness (generalized): Secondary | ICD-10-CM

## 2023-09-28 DIAGNOSIS — M5459 Other low back pain: Secondary | ICD-10-CM | POA: Diagnosis not present

## 2023-09-28 NOTE — Therapy (Signed)
 OUTPATIENT PHYSICAL THERAPY TREATMENT NOTE  Patient Name: Caleb [name] MRN: [id number] DOB:1950/12/30, 73 y.o., male Today's Date: 09/28/2023   PT End of Session - 09/28/23 1523     Visit Number 3    Date for PT Re-Evaluation 11/11/23    Authorization Type [employer] - [employer]    PT Start Time 1530    PT Stop Time 1612    PT Time Calculation (min) 42 min    Activity Tolerance Patient tolerated treatment well    Behavior During Therapy WFL for tasks assessed/performed             Past Medical History:  Diagnosis Date   High cholesterol    Sleep apnea    Type II diabetes mellitus (HCC)    Past Surgical History:  Procedure Laterality Date   LEFT HEART CATHETERIZATION WITH CORONARY ANGIOGRAM N/A 05/04/2013   Procedure: LEFT HEART CATHETERIZATION WITH CORONARY ANGIOGRAM;  Surgeon: Pamella Pert, MD;  Location: [facility] CATH LAB;  Service: Cardiovascular;  Laterality: N/A;   NO PAST SURGERIES     ROTATOR CUFF REPAIR Right 2020   Patient Active Problem List   Diagnosis Date Noted   Precordial pain 12/19/2022   Allergic rhinitis 08/06/2021   Benign essential hypertension 08/06/2021   Bowel incontinence 08/06/2021   Eczema 08/06/2021   Ganglion cyst 08/06/2021   Gastroesophageal reflux disease 08/06/2021   Hypercholesterolemia 08/06/2021   Iron deficiency anemia 08/06/2021   Pelvic and perineal pain 08/06/2021   Primary generalized (osteo)arthritis 08/06/2021   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) 08/06/2021   Type 2 diabetes mellitus with other specified complication (HCC) 08/06/2021   Vitamin D deficiency 08/06/2021   Sleep apnea 12/23/2020   S/P right rotator cuff repair 02/02/2018   Nontraumatic complete tear of right rotator cuff 11/15/2017   Chronic right shoulder pain 11/15/2017   Acute pain of right shoulder 10/18/2017   Chronic post-traumatic stress disorder 04/02/2014   Hearing loss 02/05/2014   Anxiety 11/08/2013   Ex-smoker  11/08/2013   History of colonic polyps 07/13/2003    PCP: Clovis Riley, L.August Saucer, MD (Inactive)  REFERRING PROVIDER: Hurman Horn, MD  THERAPY DIAG:  Other low back pain  Muscle weakness  REFERRING DIAG: Lumbago with sciatica, right side [M54.41]   Rationale for Evaluation and Treatment:  Rehabilitation  SUBJECTIVE:  PERTINENT PAST HISTORY:  R shoulder pain, anxiety, HTN, DM II, osteoarthritis        PRECAUTIONS: None  WEIGHT BEARING RESTRICTIONS No  FALLS:  Has patient fallen in last 6 months? No, Number of falls: 0  MOI/History of condition:  Onset date: ~January 2025  SUBJECTIVE STATEMENT Pt reports that his pain is significantly improved.  He had some minor pain while working but this was manageable.    EVAL: Caleb [name] is a 73 y.o. male who presents to clinic with chief complaint of low back pain.  This started with no clear cause but has slowly gotten worse since January.  He denies radicular pain.  He denies any weakness in his LE's.  Denies any unexplained weight loss.    Red flags:  denies   Pain:  Are you having pain? Yes Pain location: low back NPRS scale:  3/10 to 8/10 Aggravating factors: standing on one place, standing after sitting Relieving factors: rest Pain description: sharp and aching Stage: Chronic 24 hour pattern: not worse on morning   Occupation: works in Development worker, community several days a week  Assistive Device: NA  Hand Dominance: NA  Patient Goals/Specific  Activities: reduce pain   OBJECTIVE:   DIAGNOSTIC FINDINGS:  X-ray recently taken (not in EPIC), pt unable to recall result  GENERAL OBSERVATION/GAIT: Significant R hip drop during L stance  SENSATION: Light touch: Appears intact  LUMBAR AROM  AROM AROM  (Eval)  Flexion Fingertips to toes  Extension limited by 50%, w/ concordant pain  Right lateral flexion WNL  Left lateral flexion WNL  Right rotation limited by 25%, w/ concordant pain  Left rotation limited by  25%, w/ concordant pain    (Blank rows = not tested)   LE MMT:  MMT Right (Eval) Left (Eval)  Hip flexion (L2, L3) 4 4  Knee extension (L3) 4+ 4+  Knee flexion 4+ 4+  Hip abduction 4* 4+  Hip extension    Hip external rotation 4* 4*  Hip internal rotation 4* 4*  Hip adduction    Ankle dorsiflexion (L4) 4+ 4+  Ankle plantarflexion (S1)    Ankle inversion    Ankle eversion    Great Toe ext (L5)    Grossly     (Blank rows = not tested, score listed is out of 5 possible points.  N = WNL, D = diminished, C = clear for gross weakness with myotome testing, * = concordant pain with testing)  SPECIAL TESTS:  Straight leg raise: L (-), R (-) Slump: L (-), R (-)  MUSCLE LENGTH: Hamstrings: Right significant restriction; Left no restriction [name] Fus test: Right significant restriction; Left significant restriction Ely's test: Right subtle restriction; Left subtle restriction  LE ROM:  ROM Right (Eval) Left (Eval)  Hip flexion    Hip extension    Hip abduction    Hip adduction    Hip internal rotation    Hip external rotation Limited, painful Limited, painful  Knee flexion    Knee extension    Ankle dorsiflexion    Ankle plantarflexion    Ankle inversion    Ankle eversion      (Blank rows = not tested, N = WNL, * = concordant pain with testing)  Functional Tests  Eval                                                                PALPATION:   TTP bil SI, lower lumbar spine, and lumbar paraspinals  PATIENT SURVEYS:  ODI: 5/50   TODAY'S TREATMENT  Therapeutic Exercise: Creating, reviewing, and completing below HEP  PATIENT EDUCATION:  POC, diagnosis, prognosis, HEP, and outcome measures.  Pt educated via explanation, demonstration, and handout (HEP).  Pt confirms understanding verbally.   HOME EXERCISE PROGRAM: Access Code: [email or web address] URL: [email or web address] Date: 09/28/2023 Prepared by: Alphonzo Severance  Exercises - Supine  Lower Trunk Rotation  - 1 x daily - 7 x weekly - 1 sets - 20 reps - 3 hold - Hip Flexor Stretch at Edge of Bed  - 2 x daily - 7 x weekly - 2 sets - 45 second hold - Clamshell with Resistance  - 1 x daily - 7 x weekly - 3 sets - 10 reps - Bridge with Hip Abduction and Resistance - Ground Touches  - 1 x daily - 7 x weekly - 2-3 sets - 10 reps - Standing Romberg to 3/4 Tandem Stance  - 1 x daily -  7 x weekly - 1 sets - 3 reps - 45-60 sec hold  Treatment priorities   Eval        Bil hip flexor, R HS length        Core and hip strength        R hip drop        Ext intolerant                  [facility] Adult PT Treatment:                                                DATE: 09/28/2023 Therapeutic Exercise: Nustep level 7 x 5 mins Seated hamstring stretch 2x30" BIL Modified thomas stretch - 1' x2 LTR 20x Sidelying open books x10 BIL Sidelying clamshell 2x10 BIL - RTB  Therapeutic Activity  STS no UE 2x10 Standing hip abd - 2x10 ea - RTB Bridge from ball - 2x10 - 2'' hold Step up - 4'' - fwd and lat - 10x ea   ASSESSMENT:  CLINICAL IMPRESSION: 09/28/2023:  Bradyn tolerated session well with no adverse reaction.  Progressed intensity of mat exercises and introduced standing and compound movements.  Pt cued for form for standing hip abd and clamshell to maximize use of hip abd and ER.  No increase in pain with exercises.  EVAL: Cleburn is a 73 y.o. male who presents to clinic with signs and sxs consistent with low back pain which has been slowly increasing in intensity since early 2025.  Appears mechanical in nature with no signs of radiculopathy on exam.  Hashem will benefit from skilled therapy to improve his pain and ability to complete daily tasks with increased comfort.  OBJECTIVE IMPAIRMENTS: Pain, lumbar ROM, hip and core strength  ACTIVITY LIMITATIONS: bending, lifting, standing, working  PERSONAL FACTORS: See medical history and pertinent history   REHAB POTENTIAL:  Good  CLINICAL DECISION MAKING: Stable/uncomplicated  EVALUATION COMPLEXITY: Low   GOALS:   SHORT TERM GOALS: Target date: 10/14/2023  Dicky will be >75% HEP compliant to improve carryover between sessions and facilitate independent management of condition  Evaluation: ongoing Goal status: INITIAL   LONG TERM GOALS: Target date: 11/11/2023   Teruo will self report >/= 50% decrease in pain from evaluation to improve function in daily tasks  Evaluation/Baseline: 8/10 max pain Goal status: INITIAL   2.  Mackenzie will show a >/= 3 pt improvement in their ODI score (MCID is 6% or 3/50 pts) as a proxy for functional improvement   Evaluation/Baseline: 5/50 pts Goal status: INITIAL   3.  Nithin will improve the following MMTs to >/= 4+/5 with minimal pain to show improvement in strength:     Evaluation/Baseline:  LE MMT:  MMT Right (Eval) Left (Eval)  Hip flexion (L2, L3) 4 4  Knee extension (L3) 4+ 4+  Knee flexion 4+ 4+  Hip abduction 4* 4+  Hip extension    Hip external rotation 4* 4*  Hip internal rotation 4* 4*  Hip adduction    Ankle dorsiflexion (L4) 4+ 4+  Ankle plantarflexion (S1)    Ankle inversion    Ankle eversion    Great Toe ext (L5)    Grossly     (Blank rows = not tested, score listed is out of 5 possible points.  N = WNL, D = diminished, C =  clear for gross weakness with myotome testing, * = concordant pain with testing)  Goal status: INITIAL   4.  Arshia will be able to stand for at work, not limited by pain   Evaluation/Baseline: 8/10 pain Goal status: INITIAL    PLAN: PT FREQUENCY: 1-2x/week  PT DURATION: 8 weeks  PLANNED INTERVENTIONS:  97164- PT Re-evaluation, 97110-Therapeutic exercises, 97530- Therapeutic activity, [id number]- Neuromuscular re-education, 97535- Self Care, 10932- Manual therapy, [id number]- Gait training, [id number]- Aquatic Therapy, [phone]- Electrical stimulation (manual), [id number]- Vasopneumatic device, [id number]- Traction  (mechanical), [id number]- Ionotophoresis 4mg /ml Dexamethasone, Taping, Dry Needling, Joint manipulation, and Spinal manipulation.   Kimberlee Nearing [place]lu[place]iwa Kinyon PT  09/28/2023, 4:14 PM

## 2023-10-07 ENCOUNTER — Encounter: Payer: Self-pay | Admitting: Physical Therapy

## 2023-10-07 ENCOUNTER — Ambulatory Visit: Payer: Self-pay | Admitting: Physical Therapy

## 2023-10-07 DIAGNOSIS — M5459 Other low back pain: Secondary | ICD-10-CM

## 2023-10-07 DIAGNOSIS — M6281 Muscle weakness (generalized): Secondary | ICD-10-CM

## 2023-10-07 NOTE — Therapy (Signed)
 OUTPATIENT PHYSICAL THERAPY TREATMENT NOTE  Patient Name: Caleb [name] MRN: [id number] DOB:1951/02/23, 73 y.o., male Today's Date: 10/07/2023   PT End of Session - 10/07/23 0916     Visit Number 4    Date for PT Re-Evaluation 11/11/23    Authorization Type [employer] - [employer]    PT Start Time 0915    PT Stop Time 0957    PT Time Calculation (min) 42 min    Activity Tolerance Patient tolerated treatment well    Behavior During Therapy WFL for tasks assessed/performed             Past Medical History:  Diagnosis Date   High cholesterol    Sleep apnea    Type II diabetes mellitus (HCC)    Past Surgical History:  Procedure Laterality Date   LEFT HEART CATHETERIZATION WITH CORONARY ANGIOGRAM N/A 05/04/2013   Procedure: LEFT HEART CATHETERIZATION WITH CORONARY ANGIOGRAM;  Surgeon: Pamella Pert, MD;  Location: [facility] CATH LAB;  Service: Cardiovascular;  Laterality: N/A;   NO PAST SURGERIES     ROTATOR CUFF REPAIR Right 2020   Patient Active Problem List   Diagnosis Date Noted   Precordial pain 12/19/2022   Allergic rhinitis 08/06/2021   Benign essential hypertension 08/06/2021   Bowel incontinence 08/06/2021   Eczema 08/06/2021   Ganglion cyst 08/06/2021   Gastroesophageal reflux disease 08/06/2021   Hypercholesterolemia 08/06/2021   Iron deficiency anemia 08/06/2021   Pelvic and perineal pain 08/06/2021   Primary generalized (osteo)arthritis 08/06/2021   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) 08/06/2021   Type 2 diabetes mellitus with other specified complication (HCC) 08/06/2021   Vitamin D deficiency 08/06/2021   Sleep apnea 12/23/2020   S/P right rotator cuff repair 02/02/2018   Nontraumatic complete tear of right rotator cuff 11/15/2017   Chronic right shoulder pain 11/15/2017   Acute pain of right shoulder 10/18/2017   Chronic post-traumatic stress disorder 04/02/2014   Hearing loss 02/05/2014   Anxiety 11/08/2013   Ex-smoker  11/08/2013   History of colonic polyps 07/13/2003    PCP: Clovis Riley, L.August Saucer, MD (Inactive)  REFERRING PROVIDER: Hurman Horn, MD  THERAPY DIAG:  Other low back pain  Muscle weakness  REFERRING DIAG: Lumbago with sciatica, right side [M54.41]   Rationale for Evaluation and Treatment:  Rehabilitation  SUBJECTIVE:  PERTINENT PAST HISTORY:  R shoulder pain, anxiety, HTN, DM II, osteoarthritis        PRECAUTIONS: None  WEIGHT BEARING RESTRICTIONS No  FALLS:  Has patient fallen in last 6 months? No, Number of falls: 0  MOI/History of condition:  Onset date: ~January 2025  SUBJECTIVE STATEMENT Pt reports he currently has no pain.  He has been walking more as well now that he weather is nicer.  EVAL: [name] is a 73 y.o. male who presents to clinic with chief complaint of low back pain.  This started with no clear cause but has slowly gotten worse since January.  He denies radicular pain.  He denies any weakness in his LE's.  Denies any unexplained weight loss.    Red flags:  denies   Pain:  Are you having pain? Yes Pain location: low back NPRS scale:  0/10 to 8/10 Aggravating factors: standing on one place, standing after sitting Relieving factors: rest Pain description: sharp and aching Stage: Chronic 24 hour pattern: not worse on morning   Occupation: works in Development worker, community several days a week  Assistive Device: NA  Hand Dominance: NA  Patient Goals/Specific Activities:  reduce pain   OBJECTIVE:   DIAGNOSTIC FINDINGS:  X-ray recently taken (not in EPIC), pt unable to recall result  GENERAL OBSERVATION/GAIT: Significant R hip drop during L stance  SENSATION: Light touch: Appears intact  LUMBAR AROM  AROM AROM  (Eval)  Flexion Fingertips to toes  Extension limited by 50%, w/ concordant pain  Right lateral flexion WNL  Left lateral flexion WNL  Right rotation limited by 25%, w/ concordant pain  Left rotation limited by 25%, w/ concordant  pain    (Blank rows = not tested)   LE MMT:  MMT Right (Eval) Left (Eval)  Hip flexion (L2, L3) 4 4  Knee extension (L3) 4+ 4+  Knee flexion 4+ 4+  Hip abduction 4* 4+  Hip extension    Hip external rotation 4* 4*  Hip internal rotation 4* 4*  Hip adduction    Ankle dorsiflexion (L4) 4+ 4+  Ankle plantarflexion (S1)    Ankle inversion    Ankle eversion    Great Toe ext (L5)    Grossly     (Blank rows = not tested, score listed is out of 5 possible points.  N = WNL, D = diminished, C = clear for gross weakness with myotome testing, * = concordant pain with testing)  SPECIAL TESTS:  Straight leg raise: L (-), R (-) Slump: L (-), R (-)  MUSCLE LENGTH: Hamstrings: Right significant restriction; Left no restriction [name] Fus test: Right significant restriction; Left significant restriction Ely's test: Right subtle restriction; Left subtle restriction  LE ROM:  ROM Right (Eval) Left (Eval)  Hip flexion    Hip extension    Hip abduction    Hip adduction    Hip internal rotation    Hip external rotation Limited, painful Limited, painful  Knee flexion    Knee extension    Ankle dorsiflexion    Ankle plantarflexion    Ankle inversion    Ankle eversion      (Blank rows = not tested, N = WNL, * = concordant pain with testing)  Functional Tests  Eval                                                                PALPATION:   TTP bil SI, lower lumbar spine, and lumbar paraspinals  PATIENT SURVEYS:  ODI: 5/50   TODAY'S TREATMENT  Therapeutic Exercise: Creating, reviewing, and completing below HEP  PATIENT EDUCATION:  POC, diagnosis, prognosis, HEP, and outcome measures.  Pt educated via explanation, demonstration, and handout (HEP).  Pt confirms understanding verbally.   HOME EXERCISE PROGRAM: Access Code: [email or web address] URL: [email or web address] Date: 10/07/2023 Prepared by: Alphonzo Severance  Exercises - Supine Lower Trunk  Rotation  - 1 x daily - 7 x weekly - 1 sets - 20 reps - 3 hold - Hip Flexor Stretch at Edge of Bed  - 2 x daily - 7 x weekly - 2 sets - 45 second hold - Bridge with Hip Abduction and Resistance  - 1 x daily - 7 x weekly - 3 sets - 10 reps - Sidelying Hip Abduction  - 1 x daily - 7 x weekly - 3 sets - 10 reps - Sit to Stand Without Arm Support  - 1 x daily - 7 x weekly -  3 sets - 10 reps - Step Up  - 1 x daily - 7 x weekly - 3 sets - 10 reps - Lateral Step Up  - 1 x daily - 7 x weekly - 3 sets - 10 reps - Standing Romberg to 3/4 Tandem Stance  - 1 x daily - 7 x weekly - 1 sets - 3 reps - 45-60 sec hold  Treatment priorities   Eval        Bil hip flexor, R HS length        Core and hip strength        R hip drop        Ext intolerant                  [facility] Adult PT Treatment:                                                DATE: 10/07/2023 Therapeutic Exercise: Nustep level 7 x 5 mins Seated hamstring stretch 2x30" BIL Modified thomas stretch - 1' x2 LTR 10x LE X-over - 10x ea Sidelying hip abd - 2x10  Therapeutic Activity  STS no UE 10x, 10x 10# Bridge from ball - 10x - 10'' Step up - 6'' - fwd and lat - 10x ea   ASSESSMENT:  CLINICAL IMPRESSION: 10/07/2023:  Machi tolerated session well with no adverse reaction. Continued to build intensity with mat exercises to good effect.  Pt with very little pain overall now.  Encouraged him to continue his walking regimen and complete updated HEP 3-4x/week.  EVAL: Leonard is a 73 y.o. male who presents to clinic with signs and sxs consistent with low back pain which has been slowly increasing in intensity since early 2025.  Appears mechanical in nature with no signs of radiculopathy on exam.  Nhan will benefit from skilled therapy to improve his pain and ability to complete daily tasks with increased comfort.  OBJECTIVE IMPAIRMENTS: Pain, lumbar ROM, hip and core strength  ACTIVITY LIMITATIONS: bending, lifting, standing,  working  PERSONAL FACTORS: See medical history and pertinent history   REHAB POTENTIAL: Good  CLINICAL DECISION MAKING: Stable/uncomplicated  EVALUATION COMPLEXITY: Low   GOALS:   SHORT TERM GOALS: Target date: 10/14/2023  Deniz will be >75% HEP compliant to improve carryover between sessions and facilitate independent management of condition  Evaluation: ongoing Goal status: MET   LONG TERM GOALS: Target date: 11/11/2023   Borden will self report >/= 50% decrease in pain from evaluation to improve function in daily tasks  Evaluation/Baseline: 8/10 max pain Goal status: INITIAL   2.  Fiore will show a >/= 3 pt improvement in their ODI score (MCID is 6% or 3/50 pts) as a proxy for functional improvement   Evaluation/Baseline: 5/50 pts Goal status: INITIAL   3.  Uzair will improve the following MMTs to >/= 4+/5 with minimal pain to show improvement in strength:     Evaluation/Baseline:  LE MMT:  MMT Right (Eval) Left (Eval)  Hip flexion (L2, L3) 4 4  Knee extension (L3) 4+ 4+  Knee flexion 4+ 4+  Hip abduction 4* 4+  Hip extension    Hip external rotation 4* 4*  Hip internal rotation 4* 4*  Hip adduction    Ankle dorsiflexion (L4) 4+ 4+  Ankle plantarflexion (S1)    Ankle inversion  Ankle eversion    Great Toe ext (L5)    Grossly     (Blank rows = not tested, score listed is out of 5 possible points.  N = WNL, D = diminished, C = clear for gross weakness with myotome testing, * = concordant pain with testing)  Goal status: INITIAL   4.  Brown will be able to stand for at work, not limited by pain   Evaluation/Baseline: 8/10 pain Goal status: INITIAL    PLAN: PT FREQUENCY: 1-2x/week  PT DURATION: 8 weeks  PLANNED INTERVENTIONS:  97164- PT Re-evaluation, 97110-Therapeutic exercises, 97530- Therapeutic activity, [id number]- Neuromuscular re-education, 97535- Self Care, 16109- Manual therapy, [id number]- Gait training, [id number]- Aquatic Therapy,  [phone]- Electrical stimulation (manual), [id number]- Vasopneumatic device, [id number]- Traction (mechanical), [id number]- Ionotophoresis 4mg /ml Dexamethasone, Taping, Dry Needling, Joint manipulation, and Spinal manipulation.   Kimberlee Nearing Jalah [place] PT  10/07/2023, 10:02 AM

## 2023-10-11 DIAGNOSIS — M79604 Pain in right leg: Secondary | ICD-10-CM | POA: Diagnosis not present

## 2023-10-11 DIAGNOSIS — G8929 Other chronic pain: Secondary | ICD-10-CM | POA: Diagnosis not present

## 2023-10-11 DIAGNOSIS — M545 Low back pain, unspecified: Secondary | ICD-10-CM | POA: Diagnosis not present

## 2023-10-14 ENCOUNTER — Encounter: Payer: Self-pay | Admitting: Physical Therapy

## 2023-10-14 ENCOUNTER — Ambulatory Visit: Payer: Self-pay | Attending: Sports Medicine | Admitting: Physical Therapy

## 2023-10-14 DIAGNOSIS — M6281 Muscle weakness (generalized): Secondary | ICD-10-CM | POA: Insufficient documentation

## 2023-10-14 DIAGNOSIS — M5459 Other low back pain: Secondary | ICD-10-CM | POA: Insufficient documentation

## 2023-10-14 NOTE — Therapy (Signed)
 PHYSICAL THERAPY DISCHARGE SUMMARY  Visits from Start of Care: 5  Current functional level related to goals / functional outcomes: See assessment/goals   Remaining deficits: See assessment/goals   Education / Equipment: HEP and D/C plans  Patient agrees to discharge. Patient goals were met. Patient is being discharged due to meeting the stated rehab goals.  Patient Name: Caleb [name] MRN: [id number] DOB:30-Sep-1950, 73 y.o., male Today's Date: 10/14/2023   PT End of Session - 10/14/23 0917     Visit Number 5    Date for PT Re-Evaluation 11/11/23    Authorization Type [employer] - [employer]    PT Start Time 0915    PT Stop Time 0956    PT Time Calculation (min) 41 min    Activity Tolerance Patient tolerated treatment well    Behavior During Therapy [facility] for tasks assessed/performed             Past Medical History:  Diagnosis Date   High cholesterol    Sleep apnea    Type II diabetes mellitus (HCC)    Past Surgical History:  Procedure Laterality Date   LEFT HEART CATHETERIZATION WITH CORONARY ANGIOGRAM N/A 05/04/2013   Procedure: LEFT HEART CATHETERIZATION WITH CORONARY ANGIOGRAM;  Surgeon: Pamella Pert, MD;  Location: [facility] CATH LAB;  Service: Cardiovascular;  Laterality: N/A;   NO PAST SURGERIES     ROTATOR CUFF REPAIR Right 2020   Patient Active Problem List   Diagnosis Date Noted   Precordial pain 12/19/2022   Allergic rhinitis 08/06/2021   Benign essential hypertension 08/06/2021   Bowel incontinence 08/06/2021   Eczema 08/06/2021   Ganglion cyst 08/06/2021   Gastroesophageal reflux disease 08/06/2021   Hypercholesterolemia 08/06/2021   Iron deficiency anemia 08/06/2021   Pelvic and perineal pain 08/06/2021   Primary generalized (osteo)arthritis 08/06/2021   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) 08/06/2021   Type 2 diabetes mellitus with other specified complication (HCC) 08/06/2021   Vitamin D deficiency 08/06/2021   Sleep  apnea 12/23/2020   S/P right rotator cuff repair 02/02/2018   Nontraumatic complete tear of right rotator cuff 11/15/2017   Chronic right shoulder pain 11/15/2017   Acute pain of right shoulder 10/18/2017   Chronic post-traumatic stress disorder 04/02/2014   Hearing loss 02/05/2014   Anxiety 11/08/2013   Ex-smoker 11/08/2013   History of colonic polyps 07/13/2003    PCP: Clovis Riley, L.August Saucer, MD (Inactive)  REFERRING PROVIDER: Hurman Horn, MD  THERAPY DIAG:  Other low back pain  Muscle weakness  REFERRING DIAG: Lumbago with sciatica, right side [M54.41]   Rationale for Evaluation and Treatment:  Rehabilitation  SUBJECTIVE:  PERTINENT PAST HISTORY:  R shoulder pain, anxiety, HTN, DM II, osteoarthritis        PRECAUTIONS: None  WEIGHT BEARING RESTRICTIONS No  FALLS:  Has patient fallen in last 6 months? No, Number of falls: 0  MOI/History of condition:  Onset date: ~January 2025  SUBJECTIVE STATEMENT Pt reports he is in "tip top shape" and is ready for D/C  EVAL: Caleb [name] is a 73 y.o. male who presents to clinic with chief complaint of low back pain.  This started with no clear cause but has slowly gotten worse since January.  He denies radicular pain.  He denies any weakness in his LE's.  Denies any unexplained weight loss.    Red flags:  denies   Pain:  Are you having pain? Yes Pain location: low back NPRS scale:  0/10 Aggravating factors: standing on one  place, standing after sitting Relieving factors: rest Pain description: sharp and aching Stage: Chronic 24 hour pattern: not worse on morning   Occupation: works in Development worker, community several days a week  Assistive Device: NA  Hand Dominance: NA  Patient Goals/Specific Activities: reduce pain   OBJECTIVE:   DIAGNOSTIC FINDINGS:  X-ray recently taken (not in EPIC), pt unable to recall result  GENERAL OBSERVATION/GAIT: Significant R hip drop during L stance  SENSATION: Light touch: Appears  intact  LUMBAR AROM  AROM AROM  (Eval)  Flexion Fingertips to toes  Extension limited by 50%, w/ concordant pain  Right lateral flexion WNL  Left lateral flexion WNL  Right rotation limited by 25%, w/ concordant pain  Left rotation limited by 25%, w/ concordant pain    (Blank rows = not tested)   LE MMT:  MMT Right (Eval) Left (Eval)  Hip flexion (L2, L3) 4 4  Knee extension (L3) 4+ 4+  Knee flexion 4+ 4+  Hip abduction 4* 4+  Hip extension    Hip external rotation 4* 4*  Hip internal rotation 4* 4*  Hip adduction    Ankle dorsiflexion (L4) 4+ 4+  Ankle plantarflexion (S1)    Ankle inversion    Ankle eversion    Great Toe ext (L5)    Grossly     (Blank rows = not tested, score listed is out of 5 possible points.  N = WNL, D = diminished, C = clear for gross weakness with myotome testing, * = concordant pain with testing)  SPECIAL TESTS:  Straight leg raise: L (-), R (-) Slump: L (-), R (-)  MUSCLE LENGTH: Hamstrings: Right significant restriction; Left no restriction Caleb [name] test: Right significant restriction; Left significant restriction Ely's test: Right subtle restriction; Left subtle restriction  LE ROM:  ROM Right (Eval) Left (Eval)  Hip flexion    Hip extension    Hip abduction    Hip adduction    Hip internal rotation    Hip external rotation Limited, painful Limited, painful  Knee flexion    Knee extension    Ankle dorsiflexion    Ankle plantarflexion    Ankle inversion    Ankle eversion      (Blank rows = not tested, N = WNL, * = concordant pain with testing)  Functional Tests  Eval                                                                PALPATION:   TTP bil SI, lower lumbar spine, and lumbar paraspinals  PATIENT SURVEYS:  ODI: 5/50   TODAY'S TREATMENT  Therapeutic Exercise: Creating, reviewing, and completing below HEP  PATIENT EDUCATION:  POC, diagnosis, prognosis, HEP, and outcome measures.  Pt  educated via explanation, demonstration, and handout (HEP).  Pt confirms understanding verbally.   HOME EXERCISE PROGRAM: Access Code: [email or web address] URL: [email or web address] Date: 10/07/2023 Prepared by: Alphonzo Severance  Exercises - Supine Lower Trunk Rotation  - 1 x daily - 7 x weekly - 1 sets - 20 reps - 3 hold - Hip Flexor Stretch at Edge of Bed  - 2 x daily - 7 x weekly - 2 sets - 45 second hold - Bridge with Hip Abduction and Resistance  - 1 x daily - 7 x  weekly - 3 sets - 10 reps - Sidelying Hip Abduction  - 1 x daily - 7 x weekly - 3 sets - 10 reps - Sit to Stand Without Arm Support  - 1 x daily - 7 x weekly - 3 sets - 10 reps - Step Up  - 1 x daily - 7 x weekly - 3 sets - 10 reps - Lateral Step Up  - 1 x daily - 7 x weekly - 3 sets - 10 reps - Standing Romberg to 3/4 Tandem Stance  - 1 x daily - 7 x weekly - 1 sets - 3 reps - 45-60 sec hold  Treatment priorities   Eval        Bil hip flexor, R HS length        Core and hip strength        R hip drop        Ext intolerant                  [facility] Adult PT Treatment:                                                DATE: 10/14/2023 Therapeutic Exercise: Nustep level 7 x 5 mins Modified thomas stretch - 1' LE X-over - 10x ea  Therapeutic Activity  STS no UE 10x, 10x 15# Bridge from ball - 10x - 10'' Step up - 6'' - fwd and lat - 10x ea collecting information for goals, checking progress, and reviewing with patient   ASSESSMENT:  CLINICAL IMPRESSION: 10/14/2023:  Pt has met all goals, is having minimal pain, and is satisfied with his progress.  He is prepared for D/C with HEP; pt in agreement.  EVAL: Caleb [name] is a 73 y.o. male who presents to clinic with signs and sxs consistent with low back pain which has been slowly increasing in intensity since early 2025.  Appears mechanical in nature with no signs of radiculopathy on exam.  Caleb [name] will benefit from skilled therapy to improve his pain and ability to complete  daily tasks with increased comfort.  OBJECTIVE IMPAIRMENTS: Pain, lumbar ROM, hip and core strength  ACTIVITY LIMITATIONS: bending, lifting, standing, working  PERSONAL FACTORS: See medical history and pertinent history   REHAB POTENTIAL: Good  CLINICAL DECISION MAKING: Stable/uncomplicated  EVALUATION COMPLEXITY: Low   GOALS:   SHORT TERM GOALS: Target date: 10/14/2023  Caleb [name] will be >75% HEP compliant to improve carryover between sessions and facilitate independent management of condition  Evaluation: ongoing Goal status: MET   LONG TERM GOALS: Target date: 11/11/2023   Caleb [name] will self report >/= 50% decrease in pain from evaluation to improve function in daily tasks  Evaluation/Baseline: 8/10 max pain 4/4: 0/10 Goal status: MET   2.  Caleb [name] will show a >/= 3 pt improvement in their ODI score (MCID is 6% or 3/50 pts) as a proxy for functional improvement   Evaluation/Baseline: 5/50 pts 4/4: 1/50 Goal status: MET   3.  Caleb [name] will improve the following MMTs to >/= 4+/5 with minimal pain to show improvement in strength:     Evaluation/Baseline:  LE MMT:  MMT Right (Eval) Left (Eval) R/L 4/4  Hip flexion (L2, L3) 4 4   Knee extension (L3) 4+ 4+   Knee flexion 4+ 4+   Hip abduction 4* 4+   Hip  extension     Hip external rotation 4* 4* 4+  Hip internal rotation 4* 4* 4+  Hip adduction     Ankle dorsiflexion (L4) 4+ 4+   Ankle plantarflexion (S1)     Ankle inversion     Ankle eversion     Great Toe ext (L5)     Grossly      (Blank rows = not tested, score listed is out of 5 possible points.  N = WNL, D = diminished, C = clear for gross weakness with myotome testing, * = concordant pain with testing)  Goal status: MET   4.  Caleb [name] will be able to stand for at work, not limited by pain   Evaluation/Baseline: 8/10 pain 4/4: 0/10 Goal status: MET    PLAN: PT FREQUENCY: 1-2x/week  PT DURATION: 8 weeks  PLANNED INTERVENTIONS:  97164- PT  Re-evaluation, 97110-Therapeutic exercises, 97530- Therapeutic activity, 97112- Neuromuscular re-education, 97535- Self Care, 14782- Manual therapy, [id number]- Gait training, [id number]- Aquatic Therapy, [phone]- Electrical stimulation (manual), [id number]- Vasopneumatic device, [id number]- Traction (mechanical), [id number]- Ionotophoresis 4mg /ml Dexamethasone, Taping, Dry Needling, Joint manipulation, and Spinal manipulation.   Kimberlee Nearing Calahan Pak PT  10/14/2023, 9:57 AM

## 2023-10-19 ENCOUNTER — Telehealth: Payer: Self-pay | Admitting: Pharmacist

## 2023-10-19 NOTE — Progress Notes (Addendum)
   10/19/2023  Patient ID: Caleb [name], male   DOB: 05/15/1951, 73 y.o.   MRN: [id number]  Patient was identified for DM counseling based on A1c greater than 8% on last lab results.   Patient agreed to schedule for a free DM initial visit with me as the pharmacist. All changes are made in collaboration with the PCP. An office visit was scheduled for 10/24/23 at 1PM. Patient's PCP located at [facility] at [facility]. Patient advised to attend with information regarding diabetes medications and any potential barriers surrounding current treatment. If needing to re-schedule or questions prior to the visit, patient advised to contact me at [phone].     Ricka Burdock, PharmD [facility]  Phone Number: [phone]

## 2023-10-24 ENCOUNTER — Telehealth: Payer: Self-pay | Admitting: Pharmacist

## 2023-10-24 NOTE — Progress Notes (Signed)
 10/24/2023 Name: Caleb [name] MRN: [id number] DOB: 1950-12-20  Chief Complaint  Patient presents with   Diabetes    Caleb [name] is a 73 y.o. year old male who presented for a telephone visit.   They were referred to the pharmacist by a quality report for assistance in managing diabetes.   [employer] diabetes patient.  Subjective:  Care Team: Primary Care Provider: Irven Coe, MD ; Next Scheduled Visit: 11/14/23 Clinical Pharmacist: Caleb [name], PharmD  Medication Access/Adherence  Current Pharmacy:  [phone] Ginette Otto, [place] - [address] AT [facility] [address] [place] [address] Phone: [phone] Fax: [phone]   Patient reports affordability concerns with their medications: No  Patient reports access/transportation concerns to their pharmacy: No  Patient reports adherence concerns with their medications:  No     Diabetes:  Current medications: Glimepiride 4mg  daily, Metformin 1000mg  twice a day Medications tried in the past: Pioglitazone 15mg  daily (unknown why stopped)  Current glucose readings: 89, 211 after eating, 289 at 8PM yesterday Using OneTouch Verio meter; testing 1-2 times daily  Patient denies hypoglycemic s/sx including dizziness, shakiness, sweating. Patient denies hyperglycemic symptoms including polyuria, polydipsia, polyphagia, nocturia, neuropathy, blurred vision.  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  Last A1c on 09/15/23:  8.7%   Objective:  Lab Results  Component Value Date   HGBA1C 6.1 (H) 05/04/2013    Lab Results  Component Value Date   CREATININE 0.97 11/16/2022   BUN 10 11/16/2022   NA 132 (L) 11/16/2022   K 4.7 11/16/2022   CL 98 11/16/2022   CO2 26 11/16/2022    Lab Results  Component Value Date   CHOL  02/05/2009    147        ATP III CLASSIFICATION:  <200     mg/dL   Desirable  564-332  mg/dL   Borderline High  >=951    mg/dL   High          HDL 24 (L) 02/05/2009   LDLCALC  02/05/2009    94        Total Cholesterol/HDL:CHD Risk Coronary Heart Disease Risk Table                     Men   Women  1/2 Average Risk   3.4   3.3  Average Risk       5.0   4.4  2 X Average Risk   9.6   7.1  3 X Average Risk  23.4   11.0        Use the calculated Patient Ratio above and the CHD Risk Table to determine the patient's CHD Risk.        ATP III CLASSIFICATION (LDL):  <100     mg/dL   Optimal  884-166  mg/dL   Near or Above                    Optimal  130-159  mg/dL  Borderline  160-189  mg/dL   High  >329     mg/dL   Very High   TRIG 518 02/05/2009   CHOLHDL 6.1 02/05/2009    Medications Reviewed Today     Reviewed by Caleb [name], [facility] (Pharmacist) on 10/24/23 at 1530  Med List Status: <None>   Medication Order Taking? Sig Documenting Provider Last Dose Status Informant  ferrous sulfate 324 MG TBEC [id number] Yes Take 324 mg by mouth daily with breakfast. 5 days a week [provider] Taking Active Self  fluticasone (FLONASE) 50 MCG/ACT nasal spray [id number] Yes Place 1 spray into both nostrils daily.  Patient taking differently: Place 1 spray into both nostrils as needed.   Caleb [name], [place]  Caleb [name] Taking Active   glimepiride (AMARYL) 4 MG tablet [id number] No Take 4 mg by mouth every morning.  Patient not taking: Reported on 10/24/2023   [provider] Not Taking Active Self  losartan (COZAAR) 25 MG tablet [id number] Yes Take 25 mg by mouth daily.  [provider] Taking Active Self  metFORMIN (GLUCOPHAGE) 1000 MG tablet [id number] Yes TAKE ONE TABLET BY MOUTH TWICE A DAY FOR DIABETES (ANNUAL KIDNEY FUNCTION TESTING IS NEEDED) [provider] Taking Active Self  Multiple Vitamins-Minerals (CENTRUM SILVER 50+MEN PO) [id number] Yes Take by mouth. [provider] Taking Active   rosuvastatin (CRESTOR) 10 MG tablet [id number] Yes Take 10 mg by mouth daily. [provider] Taking Active Self  Semaglutide (RYBELSUS) 3 MG TABS [id number] Yes Take 1 tablet by mouth daily. [provider] Taking Active            Med Note Manfred Seed, Sue Em   Mon Oct 24, 2023  2:10 PM) Sample 30DS given 4/14  vitamin B-12 (CYANOCOBALAMIN) 500 MCG tablet [id number] Yes Take 1 tablet by mouth daily. [provider] Taking Active Self              Assessment/Plan:   Diabetes: - Currently uncontrolled - Reviewed long term cardiovascular and renal outcomes of uncontrolled blood sugar - Reviewed goal A1c, goal fasting, and goal 2 hour post prandial glucose - Reviewed dietary modifications including swapping banana out for different fruits with less sugar (raspberries, blueberries, pears, etc) - Recommend to check glucose daily before breakfast and write it down   *Summary for PCP:* - Follow-up on 11/14/23 for office visit to review BG readings and efficacy of Rybelsus 3mg  daily - START Rybelsus 3mg  daily- sample provided from the office; recommended by Caleb [name] at last visit - STOP Glimepiride- wants to decrease number of medications, so switched with Rybelsus for better efficacy - [facility] if patient is doing well going forward  - Would be $15 for 30DS at the pharmacy - Counseled to monitor for nausea and discontinue/call us  if vomiting; taking with food for first administration may be better for initial nausea concerns - Extensive discussion regarding diet today;  recommended consistency in meals as it is easier for body to adjust and correct medications as well    Caleb [name], PharmD [facility]  Phone Number: [phone]

## 2023-10-31 ENCOUNTER — Telehealth: Payer: Self-pay | Admitting: Pharmacist

## 2023-10-31 NOTE — Progress Notes (Signed)
   10/31/2023  Patient ID: Caleb [name], male   DOB: Nov 06, 1950, 73 y.o.   MRN: [id number]   Received call from patient today regarding Rybelsus. Reports he has taken it 5 days now and his vision is blurry and he is more hungry. Advised the vision concern occurs when blood sugar is corrected too quickly and usually goes away after 3 months. As for the hunger, said he is also eating smaller portions of meals now to help lower his blood sugar. Advised that it is potentially the smaller meals that is causing the hunger and not the new medication. Had small bowl of Special K and applesauce this morning. Fasting BG readings have been 160-170 (estimated A1c around 7.6%). Hasn't had an A1c greater than 200 since starting.   Plan:  - Re-start Glimepiride for 1 week to have side effects/concerns resolve - Start Rybelsus instead on next Monday 4/28 and see if side effects return - Will discuss at visit on 11/14/23  Sent to PCP as FYI.    Delvin File, PharmD [facility]  Phone Number: [phone]

## 2023-11-04 DIAGNOSIS — Z008 Encounter for other general examination: Secondary | ICD-10-CM | POA: Diagnosis not present

## 2023-11-04 DIAGNOSIS — Z6827 Body mass index (BMI) 27.0-27.9, adult: Secondary | ICD-10-CM | POA: Diagnosis not present

## 2023-11-04 DIAGNOSIS — E1169 Type 2 diabetes mellitus with other specified complication: Secondary | ICD-10-CM | POA: Diagnosis not present

## 2023-11-04 DIAGNOSIS — E663 Overweight: Secondary | ICD-10-CM | POA: Diagnosis not present

## 2023-11-04 DIAGNOSIS — I10 Essential (primary) hypertension: Secondary | ICD-10-CM | POA: Diagnosis not present

## 2023-11-04 DIAGNOSIS — E785 Hyperlipidemia, unspecified: Secondary | ICD-10-CM | POA: Diagnosis not present

## 2023-11-04 DIAGNOSIS — D5740 Sickle-cell thalassemia without crisis: Secondary | ICD-10-CM | POA: Diagnosis not present

## 2023-11-04 DIAGNOSIS — I2510 Atherosclerotic heart disease of native coronary artery without angina pectoris: Secondary | ICD-10-CM | POA: Diagnosis not present

## 2023-11-04 DIAGNOSIS — G4733 Obstructive sleep apnea (adult) (pediatric): Secondary | ICD-10-CM | POA: Diagnosis not present

## 2023-11-04 DIAGNOSIS — E1159 Type 2 diabetes mellitus with other circulatory complications: Secondary | ICD-10-CM | POA: Diagnosis not present

## 2023-11-14 ENCOUNTER — Telehealth: Payer: Self-pay | Admitting: Pharmacist

## 2023-11-14 NOTE — Progress Notes (Signed)
 11/14/2023 Name: Caleb [name] MRN: [id number] DOB: July 14, 1950  Chief Complaint  Patient presents with   Diabetes    Caleb [name] is a 73 y.o. year old male who presented for a telephone visit.   They were referred to the pharmacist by a quality report for assistance in managing diabetes.   [employer] diabetes patient.  Subjective:  Care Team: Primary Care Provider: Benedetto Brady, MD ; Next Scheduled Visit: 11/14/23 Clinical Pharmacist: Delvin File, PharmD  Medication Access/Adherence  [phone] Jon[facility] Nestle, [place] - [address] AT [facility] [address] [place] [address] Phone: [phone] Fax: [phone]   Patient reports affordability concerns with their medications: No  Patient reports access/transportation concerns to their pharmacy: No  Patient reports adherence concerns with their medications:  No     Diabetes:  Current medications: Rybelsus 3mg  daily, Metformin 1000mg  twice a day Medications tried in the past: Pioglitazone 15mg  daily (unknown why stopped)  Current glucose readings 11/14/23 visit: 140-150's in AM, under 200's two hours after eating Current glucose readings on 4/14 visit: 89, 211 after eating, 289 at 8PM yesterday Using OneTouch Verio meter; testing 1-2 times daily  Patient denies hypoglycemic s/sx including dizziness, shakiness, sweating. Patient denies hyperglycemic symptoms including polyuria, polydipsia, polyphagia, nocturia, neuropathy, blurred vision.  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  Last A1c on 09/15/23: 8.7%   Objective:  Lab Results  Component Value Date   HGBA1C 6.1 (H) 05/04/2013    Lab Results  Component Value Date   CREATININE 0.97 11/16/2022   BUN 10 11/16/2022   NA 132 (L) 11/16/2022   K 4.7 11/16/2022   CL 98 11/16/2022   CO2 26 11/16/2022    Lab Results  Component Value Date   CHOL  02/05/2009    147        ATP III CLASSIFICATION:  <200     mg/dL   Desirable  952-841  mg/dL   Borderline High  >=324    mg/dL   High          HDL 24 (L) 02/05/2009   LDLCALC  02/05/2009    94        Total Cholesterol/HDL:CHD Risk Coronary Heart Disease Risk Table                     Men   Women  1/2 Average Risk   3.4   3.3  Average Risk       5.0   4.4  2 X Average Risk   9.6   7.1  3 X Average Risk  23.4   11.0        Use the calculated Patient Ratio above and the CHD Risk Table to determine the patient's CHD Risk.        ATP III CLASSIFICATION (LDL):  <100     mg/dL   Optimal  401-027  mg/dL   Near or Above  Optimal  130-159  mg/dL   Borderline  098-119  mg/dL   High  >147     mg/dL   Very High   TRIG 829 02/05/2009   CHOLHDL 6.1 02/05/2009    Medications Reviewed Today   Medications were not reviewed in this encounter       Assessment/Plan:   Diabetes: - Currently uncontrolled - Reviewed long term cardiovascular and renal outcomes of uncontrolled blood sugar - Reviewed goal A1c, goal fasting, and goal 2 hour post prandial glucose - Reviewed dietary modifications including swapping banana out for different fruits with less sugar (raspberries, blueberries, pears, etc) - Recommend to check glucose daily before breakfast and write it down   *Summary for PCP:* - Follow-up on 01/16/24 for office visit to review BG readings and efficacy of Rybelsus 3mg  daily - Restarted Rybelsus a week ago and no side effects this time - Continue Rybelsus 3mg  daily- sample  provided from the office - [facility] will have them fill script if patient is doing well going forward  - Would be $15 for 30DS at the pharmacy - Extensive discussion regarding diet today; recommended consistency in meals as it is easier for body to adjust and correct medications as well    Delvin File, PharmD [facility]  Phone Number: [phone]

## 2023-11-18 ENCOUNTER — Emergency Department (HOSPITAL_COMMUNITY)

## 2023-11-18 ENCOUNTER — Emergency Department (HOSPITAL_COMMUNITY)
Admission: EM | Admit: 2023-11-18 | Discharge: 2023-11-18 | Disposition: A | Discharge status: Home / Self Care | Attending: Student | Admitting: Student

## 2023-11-18 ENCOUNTER — Other Ambulatory Visit: Payer: Self-pay

## 2023-11-18 DIAGNOSIS — R109 Unspecified abdominal pain: Secondary | ICD-10-CM | POA: Diagnosis not present

## 2023-11-18 DIAGNOSIS — Z7984 Long term (current) use of oral hypoglycemic drugs: Secondary | ICD-10-CM | POA: Diagnosis not present

## 2023-11-18 DIAGNOSIS — M542 Cervicalgia: Secondary | ICD-10-CM | POA: Insufficient documentation

## 2023-11-18 DIAGNOSIS — Y92410 Unspecified street and highway as the place of occurrence of the external cause: Secondary | ICD-10-CM | POA: Diagnosis not present

## 2023-11-18 DIAGNOSIS — S299XXA Unspecified injury of thorax, initial encounter: Secondary | ICD-10-CM | POA: Diagnosis not present

## 2023-11-18 DIAGNOSIS — S3991XA Unspecified injury of abdomen, initial encounter: Secondary | ICD-10-CM | POA: Diagnosis not present

## 2023-11-18 DIAGNOSIS — Z87891 Personal history of nicotine dependence: Secondary | ICD-10-CM | POA: Diagnosis not present

## 2023-11-18 DIAGNOSIS — E119 Type 2 diabetes mellitus without complications: Secondary | ICD-10-CM | POA: Insufficient documentation

## 2023-11-18 DIAGNOSIS — S0990XA Unspecified injury of head, initial encounter: Secondary | ICD-10-CM | POA: Diagnosis present

## 2023-11-18 DIAGNOSIS — S3993XA Unspecified injury of pelvis, initial encounter: Secondary | ICD-10-CM | POA: Diagnosis not present

## 2023-11-18 DIAGNOSIS — M545 Low back pain, unspecified: Secondary | ICD-10-CM | POA: Diagnosis not present

## 2023-11-18 DIAGNOSIS — R079 Chest pain, unspecified: Secondary | ICD-10-CM | POA: Diagnosis not present

## 2023-11-18 DIAGNOSIS — R519 Headache, unspecified: Secondary | ICD-10-CM | POA: Diagnosis not present

## 2023-11-18 DIAGNOSIS — M25511 Pain in right shoulder: Secondary | ICD-10-CM | POA: Diagnosis not present

## 2023-11-18 DIAGNOSIS — S199XXA Unspecified injury of neck, initial encounter: Secondary | ICD-10-CM | POA: Diagnosis not present

## 2023-11-18 MED ORDER — HYDROCODONE-ACETAMINOPHEN 5-325 MG PO TABS: 1.0000 | ORAL_TABLET | Freq: Once | ORAL | Status: DC

## 2023-11-18 MED ORDER — ACETAMINOPHEN 500 MG PO TABS
1000.0000 mg | ORAL_TABLET | Freq: Once | ORAL | Status: AC
  Administered 2023-11-18: 1000 mg via ORAL
  Filled 2023-11-18: qty 2
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
## 2023-11-24 DIAGNOSIS — M546 Pain in thoracic spine: Secondary | ICD-10-CM | POA: Diagnosis not present

## 2023-11-24 DIAGNOSIS — M545 Low back pain, unspecified: Secondary | ICD-10-CM | POA: Diagnosis not present

## 2023-11-24 DIAGNOSIS — E1169 Type 2 diabetes mellitus with other specified complication: Secondary | ICD-10-CM | POA: Diagnosis not present

## 2023-12-26 DIAGNOSIS — E1169 Type 2 diabetes mellitus with other specified complication: Secondary | ICD-10-CM | POA: Diagnosis not present

## 2024-01-16 ENCOUNTER — Telehealth: Payer: Self-pay | Admitting: Pharmacist

## 2024-01-16 NOTE — Progress Notes (Addendum)
 01/16/2024 Name: Caleb [name] MRN: [id number] DOB: February 01, 1951  Chief Complaint  Patient presents with   Diabetes    Caleb [name] is a 73 y.o. year old male who presented for a telephone visit.   They were referred to the pharmacist by a quality report for assistance in managing diabetes.   [employer] diabetes patient.  Subjective:  Care Team: Primary Care Provider: Leonel Cole, MD ; Next Scheduled Visit: 11/14/23 Clinical Pharmacist: Aloysius Lewis, PharmD  Medication Access/Adherence  Current Pharmacy:  [facility] [phone] [address], [place] - [address] AT [facility] [address] [place] [address] Phone: [phone] Fax: [phone]   Patient reports affordability concerns with their medications: No  Patient reports access/transportation concerns to their pharmacy: No  Patient reports adherence concerns with their medications:  No     Diabetes:  Current medications: Rybelsus 7mg  daily, Metformin 1000mg  twice a day Medications tried in the past: Pioglitazone 15mg  daily (unknown why stopped), Glimepiride 4mg  daily (swapped for Rybelsus)  Current glucose readings 11/14/23 visit: 140-150's in AM, under 200's two hours after eating Current glucose readings on 4/14 visit: 89, 211 after eating, 289 at 8PM yesterday Using OneTouch Verio meter; testing 1-2 times daily  Patient denies hypoglycemic s/sx including dizziness, shakiness, sweating. Patient denies hyperglycemic symptoms including polyuria, polydipsia, polyphagia, nocturia, neuropathy, blurred vision.  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  Last A1c on 09/15/23: 8.7% A1c on 12/26/23: 8.1% (0.6% decrease w/ Rybelsus 3mg )   Objective:  Lab Results  Component Value Date   HGBA1C 6.1 (H) 05/04/2013    Lab Results  Component Value Date   CREATININE 0.97 11/16/2022   BUN 10 11/16/2022   NA 132 (L) 11/16/2022   K 4.7 11/16/2022   CL 98 11/16/2022   CO2 26 11/16/2022    Lab Results  Component Value Date   CHOL  02/05/2009    147        ATP III CLASSIFICATION:  <200     mg/dL   Desirable  799-760  mg/dL   Borderline High  >=759    mg/dL   High          HDL 24 (L) 02/05/2009   LDLCALC  02/05/2009    94        Total Cholesterol/HDL:CHD Risk Coronary Heart Disease Risk Table                     Men   Women  1/2 Average Risk   3.4   3.3  Average Risk       5.0   4.4  2 X Average Risk   9.6   7.1  3 X Average Risk  23.4   11.0        Use the calculated Patient Ratio above and the CHD Risk Table to determine the patient's CHD Risk.        ATP III CLASSIFICATION (LDL):  <100     mg/dL   Optimal  899-870  mg/dL   Near or Above                    Optimal  130-159  mg/dL   Borderline  839-810  mg/dL   High  >809     mg/dL   Very High   TRIG 854 02/05/2009   CHOLHDL 6.1 02/05/2009    Medications Reviewed Today   Medications were not reviewed in this encounter       Assessment/Plan:   Diabetes: - Currently uncontrolled - Reviewed long term cardiovascular and renal outcomes of uncontrolled blood sugar - Reviewed goal A1c, goal fasting, and goal 2 hour post prandial glucose - Reviewed dietary modifications including swapping banana out for different fruits with less sugar (raspberries, blueberries, pears, etc) - Recommend to check glucose daily before breakfast and write it down   *Summary for PCP:* - Follow-up on 04/02/24 for office visit to review BG readings  - Patient did not bring BG  readings today - Discussed dose increase to 14mg , but patient requested to keep 7mg  dose for now since recently picked up a 3 month supply from the pharmacy  - Will consider dose increase after A1c check in September  - Need to monitor weight loss as main concern- need healthy BMI- now at 180.8 lbs (lost 3 lbs in last month alone)  - Can consider adding Glipizide to counteract weight loss if needed - Continue Rybelsus 7mg  daily- sample provided from the office for 14mg  tablets to start after next A1c check - For constipation, will look into getting Miralax supplements to help  *Patient's personal goal is removing medications   Aloysius Lewis, PharmD [facility]  Phone Number: [phone]

## 2024-01-27 ENCOUNTER — Ambulatory Visit: Payer: Medicare Other | Admitting: Dietician

## 2024-03-06 DIAGNOSIS — F329 Major depressive disorder, single episode, unspecified: Secondary | ICD-10-CM | POA: Diagnosis not present

## 2024-03-06 DIAGNOSIS — R0789 Other chest pain: Secondary | ICD-10-CM | POA: Diagnosis not present

## 2024-03-06 DIAGNOSIS — E1165 Type 2 diabetes mellitus with hyperglycemia: Secondary | ICD-10-CM | POA: Diagnosis not present

## 2024-03-06 DIAGNOSIS — H538 Other visual disturbances: Secondary | ICD-10-CM | POA: Diagnosis not present

## 2024-03-20 DIAGNOSIS — D508 Other iron deficiency anemias: Secondary | ICD-10-CM | POA: Diagnosis not present

## 2024-03-20 DIAGNOSIS — R972 Elevated prostate specific antigen [PSA]: Secondary | ICD-10-CM | POA: Diagnosis not present

## 2024-03-20 DIAGNOSIS — J019 Acute sinusitis, unspecified: Secondary | ICD-10-CM | POA: Diagnosis not present

## 2024-03-20 DIAGNOSIS — D509 Iron deficiency anemia, unspecified: Secondary | ICD-10-CM | POA: Diagnosis not present

## 2024-03-20 DIAGNOSIS — E7800 Pure hypercholesterolemia, unspecified: Secondary | ICD-10-CM | POA: Diagnosis not present

## 2024-03-20 DIAGNOSIS — E559 Vitamin D deficiency, unspecified: Secondary | ICD-10-CM | POA: Diagnosis not present

## 2024-03-20 DIAGNOSIS — M5441 Lumbago with sciatica, right side: Secondary | ICD-10-CM | POA: Diagnosis not present

## 2024-03-20 DIAGNOSIS — E1169 Type 2 diabetes mellitus with other specified complication: Secondary | ICD-10-CM | POA: Diagnosis not present

## 2024-03-20 DIAGNOSIS — I10 Essential (primary) hypertension: Secondary | ICD-10-CM | POA: Diagnosis not present

## 2024-03-20 DIAGNOSIS — E1165 Type 2 diabetes mellitus with hyperglycemia: Secondary | ICD-10-CM | POA: Diagnosis not present

## 2024-03-20 DIAGNOSIS — L309 Dermatitis, unspecified: Secondary | ICD-10-CM | POA: Diagnosis not present

## 2024-04-02 ENCOUNTER — Telehealth: Payer: Self-pay | Admitting: Pharmacist

## 2024-04-02 NOTE — Progress Notes (Signed)
   04/02/2024  Patient ID: Caleb [name], male   DOB: Feb 20, 1951, 73 y.o.   MRN: [id number]  Diabetes:  Current medications: Metformin 1000mg  twice a day Medications tried in the past: Pioglitazone 15mg  daily (unknown why stopped), Glimepiride 4mg  daily (swapped for Rybelsus), Rybelsus 7mg  (depression and loss of appetite)  Breakfast: Oatmeal or banana Lunch/dinner at 1PM: Varies   Last A1c on 09/15/23: 8.7% A1c on 12/26/23: 8.1% (0.6% decrease w/ Rybelsus 3mg ) A1c on 03/20/24: 7.6% (0.5% decrease w/ Rybelsus 7mg  increase)  *Summary for PCP:* - Follow-up on 04/12/24 for phone call to assess BG readings - Patient did not bring BG readings today - Discuss with Dr. Leonel about doing Glipizide XL (instead of glimepiride prior) for A1c lowering, since stopping Rybelsus - Patient feels appetite and mood are back to normal since the last visit - Monitor weight- was 180.8lbs with items on him and 178 lbs without them - Elevated PSA (would not recommend Jardiance/Farxiga)- advised to call office is he has not heard from Urology by Friday to schedule a visit  *Patient's personal goal is removing medications*   Aloysius Lewis, PharmD [facility]  Phone Number: [phone]

## 2024-04-04 NOTE — Progress Notes (Signed)
 Received message from the office that the patient had called and was requesting a call back. Lost card with my direct line on it.   Gave the patient a call back. Picked up iron 324mg  and B12 500mcg from the pharmacy. Will start doing iron daily. B12 will be 1 tablet in the morning and 1 tablet in the afternoon to= daily. Advised Dr. Leonel approved doing Glipizide XL but will see how readings look during our call on 10/2 next week. Will also discuss next steps if he has not heard from Urology at that point to schedule.    Aloysius Lewis, PharmD [facility]  Phone Number: [phone]

## 2024-04-06 ENCOUNTER — Ambulatory Visit: Attending: Cardiology | Admitting: Cardiology

## 2024-04-06 ENCOUNTER — Encounter: Payer: Self-pay | Admitting: Cardiology

## 2024-04-06 VITALS — BP 138/75 | HR 82 | Ht 68.0 in | Wt 178.6 lb

## 2024-04-06 DIAGNOSIS — E782 Mixed hyperlipidemia: Secondary | ICD-10-CM

## 2024-04-06 DIAGNOSIS — I2510 Atherosclerotic heart disease of native coronary artery without angina pectoris: Secondary | ICD-10-CM

## 2024-04-06 DIAGNOSIS — R931 Abnormal findings on diagnostic imaging of heart and coronary circulation: Secondary | ICD-10-CM | POA: Diagnosis not present

## 2024-04-06 DIAGNOSIS — I10 Essential (primary) hypertension: Secondary | ICD-10-CM

## 2024-04-06 DIAGNOSIS — E1165 Type 2 diabetes mellitus with hyperglycemia: Secondary | ICD-10-CM | POA: Diagnosis not present

## 2024-04-06 NOTE — Progress Notes (Signed)
 Cardiology Office Note:  .   Date:  04/06/2024  ID:  Caleb [name], DOB [birth year], MRN [id number] PCP:  Leonel Cole, MD  Former Cardiology Providers: N/A [facility] Providers Cardiologist:  Madonna Large, DO , [facility] (established care December 23, 2020) Electrophysiologist:  None  Click to update primary MD,subspecialty MD or APP then REFRESH:1}    Chief Complaint  Patient presents with   Follow-up    Coronary artery calcification    History of Present Illness: .   Caleb [name] is a 72 y.o. African-American male whose past medical history and cardiovascular risk factors includes: Hypertension, Aortic atherosclerosis, hypercholesterolemia, type 2 diabetes, sleep apnea not on CPAP, family history of colon cancer, former smoker, advance age.   Prior office visits patient underwent ischemic workup for chest pain.  He was noted to have mild CAC and mild nonobstructive coronary artery disease.  We been focusing on improving his modifiable cardiovascular risk factors for secondary prevention.  He was last seen in the office in December 2024 and presents today for follow-up.  Since last office visit patient denies any anginal chest pain or heart failure symptoms.  No hospitalizations or urgent care visits for cardiovascular reasons.  He has been compliant with his medical therapy.  Has lost 17# since last office visit (lifestyle and Rybelsus).  Physical endurance remains stable, walks 60 - three times a week.   He continues to work 3 days a week - [employer].   Review of Systems: .   Review of Systems  Cardiovascular:  Negative for chest pain, claudication, dyspnea on exertion, irregular heartbeat, leg swelling, near-syncope, orthopnea, palpitations, paroxysmal nocturnal dyspnea and syncope.  Respiratory:  Negative for shortness of breath.   Hematologic/Lymphatic: Negative for bleeding problem.  Musculoskeletal:  Negative for muscle cramps and myalgias.   Neurological:  Negative for dizziness and light-headedness.    Studies Reviewed:    Echocardiogram: 12/29/2022: Normal LV systolic function with visual EF 60-65%. Left ventricle cavity is normal in size. Normal left ventricular wall thickness. Normal global wall motion. Normal diastolic filling pattern, normal LAP.  Mild pulmonic regurgitation. Compared to 12/24/2022: Mild AR/moderate MR are not appreciated on current study. Otherwise no significant change.    Stress Testing: Exercise Sestamibi Stress Test 01/19/2021: Normal ECG stress. The patient exercised for 7 minutes and 26 seconds of a Bruce protocol, achieving approximately 9.26 METs. Normal BP. Peak EKG/ECG revealed no ST-T wave abnormalities. During exercise the peak ECG revealed no arrhythmias. Exercise capacity low.  Myocardial perfusion is normal. Overall LV systolic function is normal without regional wall motion abnormalities. Stress LV EF: 67%.  No previous exam available for comparison. Low risk.    Heart Catheterization: 2014: Normal coronary arteries with left dominant circulation with normal left systolic function.   CCTA 12/15/2022 1. Total coronary calcium  score of 35.3. This was 11 st percentile for age and sex matched control.  2. Normal coronary origin with left dominance.  3. CAD-RADS = 2 Mild non-obstructive.  Left Main: Patent.  LAD: Patent.  LCx: Mild stenosis (25-49%) due to calcified plaque otherwise vessel is patent.  RCA: Mild stenosis (25-49%) due to soft plaque otherwise vessel is patent.  Noncardiac findings: No significant extracardiac incidental findings identified.   RECOMMENDATIONS: Consider non-atherosclerotic causes of chest pain. Consider preventive therapy and risk factor modification.   RADIOLOGY: NA  Risk Assessment/Calculations:   NA   Labs:       Latest Ref Rng & Units 11/16/2022  8:20 AM 07/28/2022    2:31 PM 12/11/2020   10:45 AM  CBC  WBC 4.0 - 10.5 K/uL 4.7   6.1  6.3   Hemoglobin 13.0 - 17.0 g/dL 86.5  86.3  86.1   Hematocrit 39.0 - 52.0 % 40.6  42.3  41.9   Platelets 150 - 400 K/uL 282  311  312        Latest Ref Rng & Units 11/16/2022    8:20 AM 07/28/2022    2:31 PM 12/23/2020    2:36 PM  BMP  Glucose 70 - 99 mg/dL 709  848  879   BUN 8 - 23 mg/dL 10  11  8    Creatinine 0.61 - 1.24 mg/dL 9.02  9.18  9.15   BUN/Creat Ratio 10 - 24   10   Sodium 135 - 145 mmol/L 132  137  140   Potassium 3.5 - 5.1 mmol/L 4.7  4.3  4.7   Chloride 98 - 111 mmol/L 98  105  102   CO2 22 - 32 mmol/L 26  24  22    Calcium  8.9 - 10.3 mg/dL 9.3  9.3  9.9       Latest Ref Rng & Units 11/16/2022    8:20 AM 07/28/2022    2:31 PM 12/23/2020    2:36 PM  CMP  Glucose 70 - 99 mg/dL 709  848  879   BUN 8 - 23 mg/dL 10  11  8    Creatinine 0.61 - 1.24 mg/dL 9.02  9.18  9.15   Sodium 135 - 145 mmol/L 132  137  140   Potassium 3.5 - 5.1 mmol/L 4.7  4.3  4.7   Chloride 98 - 111 mmol/L 98  105  102   CO2 22 - 32 mmol/L 26  24  22    Calcium  8.9 - 10.3 mg/dL 9.3  9.3  9.9   Total Protein 6.5 - 8.1 g/dL 6.8  7.3    Total Bilirubin 0.3 - 1.2 mg/dL 0.6  0.7    Alkaline Phos 38 - 126 U/L 41  42    AST 15 - 41 U/L 20  24    ALT 0 - 44 U/L 25  18     External Labs: Collected: 07/02/2020 Creatinine 0.83 mg/dL. eGFR: >60 mL/min per 1.73 m Potassium level: 4 Lipid profile: Total cholesterol 160, triglycerides 98, HDL 41, LDL 101, non-HDL 119 Hemoglobin A1c: 7.3   External Labs: Collected: November 2023 available in Care Everywhere from [place] [facility] Total cholesterol 130, triglycerides 176, HDL 37, LDL calculated 58  External Labs: Collected: February 09, 2024 [facility], Care Everywhere. Total cholesterol 117, triglycerides 76, HDL 38, LDL 69. TSH 0.88. Hemoglobin 14 g/dL. Creatinine 1.01. GFR 79 mL/min. Sodium 138, potassium 4.2, chloride 27, bicarb 105 AST, ALT, alkaline phosphatase within normal limits. Hemoglobin A1c 7.4  Physical Exam:    Today's Vitals    04/06/24 0809  BP: 138/75  Pulse: 82  SpO2: 98%  Weight: 178 lb 9.6 oz (81 kg)  Height: 5' 8 (1.727 m)   Body mass index is 27.16 kg/m. Wt Readings from Last 3 Encounters:  04/06/24 178 lb 9.6 oz (81 kg)  01/16/24 180 lb 12.8 oz (82 kg)  11/18/23 195 lb 1.7 oz (88.5 kg)    Physical Exam  Constitutional: No distress.  Age appropriate, hemodynamically stable.   Neck: No JVD present.  Cardiovascular: Normal rate, regular rhythm, S1 normal, S2 normal, intact distal pulses and normal  pulses. Exam reveals no gallop, no S3 and no S4.  No murmur heard. Pulmonary/Chest: Effort normal and breath sounds normal. No stridor. He has no wheezes. He has no rales.  Abdominal: Soft. Bowel sounds are normal. He exhibits no distension. There is no abdominal tenderness.  Musculoskeletal:        General: No edema.     Cervical back: Neck supple.  Neurological: He is alert and oriented to person, place, and time. He has intact cranial nerves (2-12).  Skin: Skin is warm and moist.   Impression & Recommendation(s):  Impression:   ICD-10-CM   1. Agatston coronary artery calcium  score less than 100  R93.1     2. Nonobstructive atherosclerosis of coronary artery  I25.10     3. Benign hypertension  I10     4. Mixed hyperlipidemia  E78.2     5. Type 2 diabetes mellitus with hyperglycemia, without long-term current use of insulin (HCC)  E11.65         Recommendation(s):  Nonobstructive atherosclerosis of coronary artery Agatston coronary artery calcium  score less than 100 Denies anginal chest pain or heart failure symptoms. No hospitalizations or urgent care visits for cardiovascular reasons since last office encounter. Not on aspirin  due to mild CAC as his percentile is less than 75th Currently on Crestor 10 mg p.o. daily. Outside labs to the [facility] reviewed via Care Everywhere Reemphasized the importance of secondary prevention with focus on improving the modifiable cardiovascular risk  factors such as glycemic control, lipid management, blood pressure control, weight loss.  Benign hypertension Office blood pressures are acceptable. Continue Cozaar  25 mg p.o. daily Reemphasized importance of low-salt diet.  Mixed hyperlipidemia Currently on Crestor 10 mg p.o. daily. Fasting lipids from the [place] checked on July 31st 2025 reviewed independently LDL acceptable at 65 mg/dL  Type 2 diabetes mellitus with hyperglycemia, without long-term current use of insulin (HCC) Reemphasized importance of glycemic control-managed by PCP at the [place]. Currently on ARB, statin therapy Was on Rybelsus but stopped it due to depressive thoughts.  Reemphasized importance of reducing foods high in glycemic index as well as carbohydrate  Orders Placed:  No orders of the defined types were placed in this encounter.   Final Medication List:   No orders of the defined types were placed in this encounter.   There are no discontinued medications.    Current Outpatient Medications:    cyanocobalamin  (VITAMIN B12) 500 MCG tablet, Take 500 mcg by mouth daily., Disp: , Rfl:    ferrous sulfate 324 MG TBEC, Take 324 mg by mouth daily with breakfast. 5 days a week, Disp: , Rfl:    fluticasone  (FLONASE ) 50 MCG/ACT nasal spray, Place 1 spray into both nostrils daily. (Patient taking differently: Place 1 spray into both nostrils as needed.), Disp: 9.9 mL, Rfl: 2   losartan  (COZAAR ) 25 MG tablet, Take 25 mg by mouth daily., Disp: , Rfl:    metFORMIN (GLUCOPHAGE) 1000 MG tablet, TAKE ONE TABLET BY MOUTH TWICE A DAY FOR DIABETES (ANNUAL KIDNEY FUNCTION TESTING IS NEEDED), Disp: , Rfl:    Multiple Vitamins-Minerals (CENTRUM SILVER 50+MEN PO), Take by mouth., Disp: , Rfl:    rosuvastatin (CRESTOR) 10 MG tablet, Take 10 mg by mouth daily., Disp: , Rfl:    glimepiride (AMARYL) 4 MG tablet, Take 4 mg by mouth every morning. (Patient not taking: Reported on 04/06/2024), Disp: , Rfl:    Semaglutide (RYBELSUS) 3 MG  TABS, Take 1 tablet by mouth daily. (Patient  not taking: Reported on 04/06/2024), Disp: , Rfl:   Consent:   NA  Disposition:   1 year follow-up sooner if needed  His questions and concerns were addressed to his satisfaction. He voices understanding of the recommendations provided during this encounter.    Signed, Madonna Michele HAS, [facility]  A Division of [facility] [address] [place], [place] [address]

## 2024-04-06 NOTE — Patient Instructions (Signed)
 Medication Instructions:  Your physician recommends that you continue on your current medications as directed. Please refer to the Current Medication list given to you today.  *If you need a refill on your cardiac medications before your next appointment, please call your pharmacy*  Lab Work: None.  If you have labs (blood work) drawn today and your tests are completely normal, you will receive your results only by: MyChart Message (if you have MyChart) OR A paper copy in the mail If you have any lab test that is abnormal or we need to change your treatment, we will call you to review the results.  Testing/Procedures: None.  Follow-Up: At [facility], you and your health needs are our priority.  As part of our continuing mission to provide you with exceptional heart care, our providers are all part of one team.  This team includes your primary Cardiologist (physician) and Advanced Practice Providers or APPs (Physician Assistants and Nurse Practitioners) who all work together to provide you with the care you need, when you need it.  Your next appointment:   1 year(s)  Provider:   Madonna Large, DO

## 2024-04-10 DIAGNOSIS — I10 Essential (primary) hypertension: Secondary | ICD-10-CM | POA: Diagnosis not present

## 2024-04-10 DIAGNOSIS — E1165 Type 2 diabetes mellitus with hyperglycemia: Secondary | ICD-10-CM | POA: Diagnosis not present

## 2024-04-10 DIAGNOSIS — E1169 Type 2 diabetes mellitus with other specified complication: Secondary | ICD-10-CM | POA: Diagnosis not present

## 2024-04-10 DIAGNOSIS — M150 Primary generalized (osteo)arthritis: Secondary | ICD-10-CM | POA: Diagnosis not present

## 2024-04-12 ENCOUNTER — Telehealth: Payer: Self-pay | Admitting: Pharmacist

## 2024-04-12 NOTE — Progress Notes (Signed)
   04/12/2024  Patient ID: Caleb [name], male   DOB: August 30, 1950, 73 y.o.   MRN: [id number]  Patient returned my call diabetes check-in.  Reports his BG was 183 this morning. Plan to add another Glipizide XL tablet daily. Would prefer to do it as 1 tablet with breakfast and 1 tablet with dinner- advised that is okay. Will take it the same way as his metformin. Met with the nutrititionist recently and is implementing diet changes that should help as well.   Follow-up on 10/9 for BG check-in.    Aloysius Lewis, PharmD [facility]  Phone Number: [phone]

## 2024-04-19 ENCOUNTER — Telehealth: Payer: Self-pay | Admitting: Pharmacist

## 2024-04-19 NOTE — Progress Notes (Signed)
   04/19/2024  Patient ID: Caleb [name], male   DOB: June 03, 1951, 73 y.o.   MRN: [id number]  Patient called me this morning. Reported his BG averages are around 131. This is still slightly above goal of <130. He should that his daytime BG's are still up a little at times so we have room to add another Glipizide XL tablet in the morning. Will now start Glipizide XL 5mg - 2 tabs in AM   1 tab in PM. Also planning to check BG's both in the morning and prior to bed to see if they rise overnight for him. Advised if any issues with lows at all, to go ahead and remove the additional Glipizide XL tablet in the morning- confirmed understanding.  123, 127, 133, 141 5PM: 154, 168  Aloysius Lewis, PharmD [facility]  Phone Number: [phone]

## 2024-04-26 ENCOUNTER — Telehealth: Payer: Self-pay | Admitting: Pharmacist

## 2024-04-26 NOTE — Progress Notes (Signed)
   04/26/2024  Patient ID: Caleb [name], male   DOB: 08/01/1950, 73 y.o.   MRN: [id number]  Called and left a voicemail for the patient for DM check-in. Patient was starting Glipizide XL 2 tablets with breakfast and 1 with dinner at our last call, with goal of checking BG's prior to bedtime and breakfast. Requesting call back at earliest convenience.  AM: 141, 156, 141, 147, 127, 117, 125 PM: 111, 78, 117  Eating sugar-free applesauce + caramel rice cake later at night due to being hungry.   Dinner last night: 1 Manwich, few pecans Breakfast: Banana, oatmeal Lunch: Mandarin oranges, 4 flat chicken wings  Patient returned my call. Reports the above BG readings. Advised these look really good. Would not recommend any change at this time. Has enough tablets to last until next week. Only concern is the fatigue and hunger late at night. [place] he has been trying to eat healthier snacks later, but unknown why he is more hungry now. Will monitor over the next week and adjust his Glipizide again if needed. Follow-up 10/23 at 10AM.    Aloysius Lewis, PharmD [facility]  Phone Number: [phone]

## 2024-05-02 ENCOUNTER — Telehealth: Payer: Self-pay | Admitting: Pharmacist

## 2024-05-02 NOTE — Progress Notes (Signed)
   05/02/2024  Patient ID: Caleb [name], male   DOB: May 30, 1951, 73 y.o.   MRN: [id number]  Received call from patient today. Said he won't be available during our time slot for tomorrow so he called in advance.  AM: 112, 115, 122, 126, 116, 131 PM: 156, 168, 171, 122 (afternoons look a little higher but no changes to cause this)  Extensive discussion regarding diet today. Reports having hunger later in the day a lot still. Said he enjoys peanut butter banana sandwiches. Advised every component of this will make his sugars go up- told to avoid this snack if possible.   Enjoys pineapples, strawberries, bananas; pecans, walnuts; rice cakes. Discussed substitutes like cheese, yogurt, meats (in small quantities), veggie straws/ Sun Chips. For breakfast consider switching oatmeal for eggs +/- bacon (for more protein instead).   Will need Glipizide refill.   No changes with regimen at this time. Will focus on diet changes. Follow-up in 2 weeks on 11/5 at 3PM.   Updated glipizide XL refill sent to the pharmacy.    Aloysius Lewis, PharmD, [facility] Phone Number: [phone]

## 2024-05-09 NOTE — Progress Notes (Signed)
 Patient called on 10/29 to let me know he missed about 2-3 days of BG readings due to running out of strips. Advised he is checking twice a day and the recent script was filled for daily testing. He will therefore run out earlier than the 90 day mark. Will need to updated script when this concern comes back around.   Aloysius Lewis, PharmD, [facility] Phone Number: [phone]

## 2024-05-18 ENCOUNTER — Telehealth: Payer: Self-pay | Admitting: Pharmacist

## 2024-05-18 NOTE — Progress Notes (Signed)
   05/18/2024  Patient ID: Caleb [name], male   DOB: 12/01/1950, 73 y.o.   MRN: [id number]  Patient returned my call to discuss BG readings today.  BG readings:  AM: 113, 119, 127, 114, 104, 140, 121, 111, 101, 113, 101, 88 PM: 89, 67, 128, 91, 134, 107, 81  Last night he ate chili + a few crackers and a banana Had tooth extracted, so he hasn't been eating as much lately.   Follow-up on 12/8 at 1:30PM in-office for DM follow-up. Advised he can do fingersticks every other day or as-needed for symptoms. No med changes today.   Flu shot at clinic tomorrow at 8:30AM- thanked for the reminder. Having pain above right leg. Plans to call office to schedule visit with Dr. Leonel after we finish the call. Also seeing Urology on Monday for elevated PSA.    Aloysius Lewis, PharmD, [facility] Phone Number: [phone]

## 2024-05-28 ENCOUNTER — Other Ambulatory Visit: Payer: Self-pay | Admitting: Urology

## 2024-05-28 ENCOUNTER — Encounter: Payer: Self-pay | Admitting: Urology

## 2024-05-28 DIAGNOSIS — N401 Enlarged prostate with lower urinary tract symptoms: Secondary | ICD-10-CM

## 2024-05-28 DIAGNOSIS — R972 Elevated prostate specific antigen [PSA]: Secondary | ICD-10-CM

## 2024-05-28 DIAGNOSIS — R351 Nocturia: Secondary | ICD-10-CM

## 2024-06-20 ENCOUNTER — Inpatient Hospital Stay: Admission: RE | Admit: 2024-06-20 | Discharge: 2024-06-20 | Attending: Urology | Admitting: Urology

## 2024-06-20 DIAGNOSIS — N401 Enlarged prostate with lower urinary tract symptoms: Secondary | ICD-10-CM

## 2024-06-20 DIAGNOSIS — R972 Elevated prostate specific antigen [PSA]: Secondary | ICD-10-CM

## 2024-06-20 DIAGNOSIS — R351 Nocturia: Secondary | ICD-10-CM

## 2024-06-20 MED ORDER — GADOPICLENOL 0.5 MMOL/ML IV SOLN
10.0000 mL | Freq: Once | INTRAVENOUS | Status: AC | PRN
  Administered 2024-06-20: 10 mL via INTRAVENOUS

## 2024-06-25 ENCOUNTER — Telehealth: Payer: Self-pay | Admitting: Pharmacist

## 2024-06-25 NOTE — Progress Notes (Signed)
° °  06/25/2024  Patient ID: Caleb [name], male   DOB: September 23, 1950, 73 y.o.   MRN: [id number]  Met with the patient in-office today. BG readings from 11/25 to 12/15 are listed below:  AM: 109, 158, 158, 135, 87, 151, 116 (average 131) PM: 133, 140, 136, 122, 79, 88, 141 (average 120)  Reports continuing Glipizide and Metformin. Still has occasional hunger at night and will wake up in the middle of the night to eat- has applesauce.   Reviewed anemia with iron and B12- advised reasoning for both at this time. Also reviewed MRI. Listed IMPRESSION: 1. 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 2. Benign prostatic hyperplasia with encapsulated nodularity in the transition zone.   Plans to go to [facility] after visit to ask when he can get biopsy completed at this time. Main concern is the lesion from MRI. Will schedule a visit for review of left shoulder and right thigh pain after this is completed.   Follow-up call on 1/26 at [facility] for BG review.     Aloysius Lewis, PharmD, [facility] [place] [facility] Phone Number: [phone]

## 2024-08-06 ENCOUNTER — Telehealth: Payer: Self-pay | Admitting: Pharmacist

## 2024-08-06 NOTE — Progress Notes (Signed)
" ° °  08/06/2024  Patient ID: Caleb [name], male   DOB: 1951/06/04, 74 y.o.   MRN: [id number]  Called and spoke with the patient on the phone today. Reports biopsy completed on Tuesday. Appt on 2/9 with Urology to discuss next options with prostate cancer diagnosis.  For diabetes, he reports the following blood sugars from 1/12 to today: AM: 153, 114, 105, 180, 66, 127, 134, 110, (diagnosis date) 170, 147, 123, 149, 116, 188 (apple sticks + banana at 11PM)  PM: 121, 78, 100, 110, 169, 103, 144, 173, 163, 136  Said that since his diagnosis date his readings have been higher because he has not stressed over his diet as much. Advised I completely understand and with everything going on to just be diet-conscious. [place] he will try to do better and work on it as he does not want high BG concerns on top of everything else.  Continues with current medications. Will follow-up in-office on 3/27 for DM check-in.    Aloysius Lewis, PharmD, [facility] Phone Number: [phone]  "
